# Patient Record
Sex: Male | Born: 1965 | Race: White | Hispanic: No | Marital: Married | Smoking: Former smoker
Health system: Southern US, Community
[De-identification: ages and names within clinical notes are randomized; demographics above are authoritative.]

## PROBLEM LIST (undated history)

## (undated) DIAGNOSIS — J309 Allergic rhinitis, unspecified: Secondary | ICD-10-CM

## (undated) DIAGNOSIS — E785 Hyperlipidemia, unspecified: Secondary | ICD-10-CM

## (undated) DIAGNOSIS — R1310 Dysphagia, unspecified: Secondary | ICD-10-CM

## (undated) DIAGNOSIS — Z872 Personal history of diseases of the skin and subcutaneous tissue: Secondary | ICD-10-CM

## (undated) DIAGNOSIS — K219 Gastro-esophageal reflux disease without esophagitis: Secondary | ICD-10-CM

## (undated) DIAGNOSIS — I10 Essential (primary) hypertension: Secondary | ICD-10-CM

## (undated) DIAGNOSIS — M109 Gout, unspecified: Secondary | ICD-10-CM

## (undated) HISTORY — DX: Allergic rhinitis, unspecified: J30.9

## (undated) HISTORY — DX: Dysphagia, unspecified: R13.10

## (undated) HISTORY — DX: Essential (primary) hypertension: I10

## (undated) HISTORY — DX: Gout, unspecified: M10.9

## (undated) HISTORY — DX: Hyperlipidemia, unspecified: E78.5

## (undated) HISTORY — PX: HIATAL HERNIA REPAIR: SHX195

## (undated) HISTORY — DX: Gastro-esophageal reflux disease without esophagitis: K21.9

## (undated) HISTORY — PX: KNEE SURGERY: SHX244

## (undated) HISTORY — DX: Personal history of diseases of the skin and subcutaneous tissue: Z87.2

## (undated) HISTORY — PX: KNEE ARTHROPLASTY: SHX992

---

## 2003-08-16 ENCOUNTER — Other Ambulatory Visit: Payer: Self-pay

## 2004-02-25 ENCOUNTER — Emergency Department: Payer: Self-pay | Admitting: Emergency Medicine

## 2004-11-07 ENCOUNTER — Emergency Department: Payer: Self-pay | Admitting: Emergency Medicine

## 2004-11-07 ENCOUNTER — Other Ambulatory Visit: Payer: Self-pay

## 2005-07-02 ENCOUNTER — Ambulatory Visit: Payer: Self-pay | Admitting: Gastroenterology

## 2005-07-02 ENCOUNTER — Other Ambulatory Visit: Payer: Self-pay

## 2005-08-02 ENCOUNTER — Emergency Department: Payer: Self-pay | Admitting: Emergency Medicine

## 2005-08-02 ENCOUNTER — Other Ambulatory Visit: Payer: Self-pay

## 2005-10-25 ENCOUNTER — Emergency Department: Payer: Self-pay | Admitting: Emergency Medicine

## 2005-11-13 ENCOUNTER — Emergency Department: Payer: Self-pay | Admitting: General Practice

## 2005-11-13 ENCOUNTER — Other Ambulatory Visit: Payer: Self-pay

## 2005-12-10 ENCOUNTER — Other Ambulatory Visit: Payer: Self-pay

## 2005-12-10 ENCOUNTER — Ambulatory Visit: Payer: Self-pay | Admitting: Gastroenterology

## 2006-02-13 ENCOUNTER — Ambulatory Visit: Payer: Self-pay | Admitting: Gastroenterology

## 2006-10-18 ENCOUNTER — Emergency Department: Payer: Self-pay | Admitting: Emergency Medicine

## 2007-01-06 ENCOUNTER — Emergency Department: Payer: Self-pay | Admitting: Emergency Medicine

## 2007-01-19 ENCOUNTER — Emergency Department: Payer: Self-pay

## 2007-03-16 ENCOUNTER — Ambulatory Visit: Payer: Self-pay | Admitting: Gastroenterology

## 2007-05-19 ENCOUNTER — Emergency Department: Payer: Self-pay | Admitting: Emergency Medicine

## 2008-08-29 ENCOUNTER — Ambulatory Visit: Payer: Self-pay | Admitting: Gastroenterology

## 2012-04-13 ENCOUNTER — Emergency Department: Payer: Self-pay | Admitting: Emergency Medicine

## 2013-06-10 ENCOUNTER — Ambulatory Visit: Payer: Self-pay | Admitting: Family Medicine

## 2013-06-17 ENCOUNTER — Encounter: Payer: Self-pay | Admitting: Neurology

## 2013-06-21 ENCOUNTER — Telehealth: Payer: Self-pay | Admitting: Neurology

## 2013-06-21 ENCOUNTER — Ambulatory Visit: Payer: BC Managed Care – PPO | Admitting: Neurology

## 2013-06-21 NOTE — Telephone Encounter (Signed)
Patient was no show for scheduled new patient visit on 06/21/2013

## 2013-07-16 ENCOUNTER — Telehealth: Payer: Self-pay | Admitting: Neurology

## 2013-07-16 NOTE — Telephone Encounter (Signed)
Called patient to r/s appointment from 06/21/13 @ 8 am, per patient he is waiting on new insurance's approval

## 2014-08-29 ENCOUNTER — Ambulatory Visit
Admit: 2014-08-29 | Disposition: A | Discharge status: Home / Self Care | Payer: Self-pay | Attending: Family Medicine | Admitting: Family Medicine

## 2015-01-26 ENCOUNTER — Telehealth: Payer: Self-pay | Admitting: Family Medicine

## 2015-01-26 MED ORDER — AMLODIPINE BESYLATE 5 MG PO TABS: 5.0000 mg | ORAL_TABLET | Freq: Every day | ORAL | Status: DC

## 2015-01-26 NOTE — Telephone Encounter (Signed)
Blood pressure 185/101; rechecked a few times, both arms Asymptomatic, no CP, no HA, no blurry vision He would like patient seen today I agree, okay to bring him over and I'll see him after lunch (not fast food, no added salt)

## 2015-01-26 NOTE — Telephone Encounter (Signed)
I spoke with patient He could not come in today for appointment as planned today I want to get him on medicine; he said his heart rate was 45 at doctor's office; I was not told that I explained Patient will call in the morning to schedule an appt with me Friday; okay to double book

## 2015-01-26 NOTE — Telephone Encounter (Signed)
PTS WIFE CALLED IN AND THE PTS JOB WAS THREATENED SO HE HAD TO GO TO WORK.

## 2015-01-27 ENCOUNTER — Ambulatory Visit: Payer: No Typology Code available for payment source

## 2015-01-27 VITALS — BP 141/90

## 2015-01-27 DIAGNOSIS — Z0130 Encounter for examination of blood pressure without abnormal findings: Secondary | ICD-10-CM

## 2015-01-27 NOTE — Telephone Encounter (Signed)
Pt can be double booked for Tues sept 20 at 8:45 or 10:45 if those times don't work then we will need to speak with Dr  Sherie Don again

## 2015-01-31 ENCOUNTER — Ambulatory Visit (INDEPENDENT_AMBULATORY_CARE_PROVIDER_SITE_OTHER): Payer: No Typology Code available for payment source | Admitting: Family Medicine

## 2015-01-31 ENCOUNTER — Encounter: Payer: Self-pay | Admitting: Family Medicine

## 2015-01-31 VITALS — BP 143/88 | HR 55 | Temp 97.0°F | Ht 68.25 in | Wt 166.0 lb

## 2015-01-31 DIAGNOSIS — M25511 Pain in right shoulder: Secondary | ICD-10-CM | POA: Diagnosis not present

## 2015-01-31 DIAGNOSIS — R634 Abnormal weight loss: Secondary | ICD-10-CM | POA: Diagnosis not present

## 2015-01-31 DIAGNOSIS — Z85820 Personal history of malignant melanoma of skin: Secondary | ICD-10-CM | POA: Diagnosis not present

## 2015-01-31 DIAGNOSIS — I10 Essential (primary) hypertension: Secondary | ICD-10-CM | POA: Insufficient documentation

## 2015-01-31 DIAGNOSIS — M109 Gout, unspecified: Secondary | ICD-10-CM | POA: Insufficient documentation

## 2015-01-31 DIAGNOSIS — R1310 Dysphagia, unspecified: Secondary | ICD-10-CM | POA: Insufficient documentation

## 2015-01-31 DIAGNOSIS — E785 Hyperlipidemia, unspecified: Secondary | ICD-10-CM | POA: Insufficient documentation

## 2015-01-31 DIAGNOSIS — M25519 Pain in unspecified shoulder: Secondary | ICD-10-CM | POA: Insufficient documentation

## 2015-01-31 MED ORDER — AMLODIPINE BESYLATE 5 MG PO TABS: 5.0000 mg | ORAL_TABLET | Freq: Every day | ORAL | Status: DC

## 2015-01-31 NOTE — Assessment & Plan Note (Signed)
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

## 2015-01-31 NOTE — Progress Notes (Signed)
BP 143/88 mmHg  Pulse 55  Temp(Src) 97 F (36.1 C)  Ht 5' 8.25" (1.734 m)  Wt 166 lb (75.297 kg)  BMI 25.04 kg/m2  SpO2 99%   Subjective:    Patient ID: Sean Ade., male    DOB: 11/09/1965, 49 y.o.   MRN: [id number]  HPI: Sean Delacruz. is a 49 y.o. male  Chief Complaint  Patient presents with  . Hypertension    Patient came in for BP check on Friday. There is still a pending note from Friday's BP check.   He was at the rheumatologist's office last week; nothing  Nothing out of the ordinary in regards to stress or dietary intake Stays away from certain foods because of his gout He does not take anything for allergies Back on the BP medicine; got a little headache for the first few days back on the medicine, but better now Trying to follow DASH guidelines  For his gout, he saw rheumatologist at [facility]; they think the spider bite affected his immune system; they are going to start him on a new medicine; if he starts to get a rash, then stop it and call them; he goes Thursday for an MRI of the thumb; spider bit him on the left elbow, but really affected his thumb; he spent 21 days off and on in the hospital; had a port in his chest, had IV antibiotics; they are checking labs  He is going to have surgery on his esophagus; lots of things he still can't eat; tries to avoid meat; mostly eats vegetables but has to watch citrus and tomato-based; he has lost 15 pounds, appetite has not been as good; he thinks it is all the esophagus problem; the insurance is now straightened out; he sees surgeon for this, at [facility]  They cut the place out of his back and it was cancerous, then went back a 2nd time for deeper incision and that came back negative; it was in fact melanoma; he was going back to see derm every 3 months for a while and then every 6 months now  Relevant past medical, surgical, family and social history reviewed and updated as indicated. Interim medical history since our  last visit reviewed. Allergies and medications reviewed and updated.  Review of Systems  Constitutional: Positive for unexpected weight change (he thinks this is the esophagus issue).  Cardiovascular: Negative for chest pain.  Musculoskeletal:       Knot on the right shoulder, noticed after had mole removed; went to massage therapist; sore to the touch  Skin:       No new moles  Neurological: Positive for headaches (for first few days on BP medicine, better now).  Hematological: Does not bruise/bleed easily.   Per HPI unless specifically indicated above     Objective:    BP 143/88 mmHg  Pulse 55  Temp(Src) 97 F (36.1 C)  Ht 5' 8.25" (1.734 m)  Wt 166 lb (75.297 kg)  BMI 25.04 kg/m2  SpO2 99%  Wt Readings from Last 3 Encounters:  01/31/15 166 lb (75.297 kg)  08/31/14 174 lb (78.926 kg)    Physical Exam  Constitutional: He appears well-developed and well-nourished. No distress.  HENT:  Head: Normocephalic and atraumatic.  Eyes: EOM are normal. No scleral icterus.  Neck: No thyromegaly present.  Cardiovascular: Regular rhythm.  Bradycardia present.   Pulmonary/Chest: Effort normal and breath sounds normal.  Abdominal: Soft. Bowel sounds are normal. He exhibits no distension.  Musculoskeletal: He exhibits no edema.       Right shoulder: He exhibits bony tenderness (slight tenderness over the [facility] joint right side; no limited ROM; no edema or erythema).       Left hand: He exhibits decreased range of motion (left thumb decreased ROM).  Neurological: Coordination normal.  Skin: Skin is warm and dry. No pallor.  Oval scar on upper right side back at site of melanoma; no dark pigment visualized  Psychiatric: He has a normal mood and affect. His behavior is normal. Judgment and thought content normal.    No results found for this or any previous visit.    Assessment & Plan:   Problem List Items Addressed This Visit      Cardiovascular and Mediastinum   Hypertension -  Primary    Try to follow the DASH guidelines; printed out in AVS for him; continue same dose of CCB; AVOID thiazide diuretics with his significant gout; patient will monitor his own BP and contact me if not controlled; we opted to not increase his medicine today since he has mild headaches on the CCB when initiating it      Relevant Medications   amLODipine (NORVASC) 5 MG tablet     Other   Hx of melanoma of skin    Surgical site examined; no evidence of pigment change or abnormality; keep f/u with dermatologist      Acromioclavicular joint pain    Topical ice, stretching      Weight loss, unintentional    Patient reports this is related to decreased intake, 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  An after-visit summary was printed and given to the patient at check-out.  Please see the patient instructions which may contain other information and recommendations beyond what is mentioned above in the assessment and plan.  Meds ordered this encounter  Medications  . amLODipine (NORVASC) 5 MG tablet    Sig: Take 1 tablet (5 mg total) by mouth daily.    Dispense:  30 tablet    Refill:  6

## 2015-01-31 NOTE — Assessment & Plan Note (Signed)
Try to follow the DASH guidelines; printed out in AVS for him; continue same dose of CCB; AVOID thiazide diuretics with his significant gout; patient will monitor his own BP and contact me if not controlled; we opted to not increase his medicine today since he has mild headaches on the CCB when initiating it

## 2015-01-31 NOTE — Patient Instructions (Addendum)
Do keep an eye on your blood pressure and notify me if top number over 140 and/or bottom number over 90 Try to follow the DASH guidelines Call me if BP not controlled Do have your other doctors keep me in the loop Wear sunscreen SPF 50+ if outdoors for any length of time, and try to avoid peak hours when possible Keep follow-up with dermatologist Try ice topically over the sore place 15 minutes at a time, 3 or 4 times a day; keep thin cloth between ice and skin Try stretching and massage Return in 3 months for follow-up of your blood pressure, but call or come in sooner if blood pressure not controlled or other issues are going on  DASH Eating Plan DASH stands for "Dietary Approaches to Stop Hypertension." The DASH eating plan is a healthy eating plan that has been shown to reduce high blood pressure (hypertension). Additional health benefits may include reducing the risk of type 2 diabetes mellitus, heart disease, and stroke. The DASH eating plan may also help with weight loss. WHAT DO I NEED TO KNOW ABOUT THE DASH EATING PLAN? For the DASH eating plan, you will follow these general guidelines:  Choose foods with a percent daily value for sodium of less than 5% (as listed on the food label).  Use salt-free seasonings or herbs instead of table salt or sea salt.  Check with your health care provider or pharmacist before using salt substitutes.  Eat lower-sodium products, often labeled as "lower sodium" or "no salt added."  Eat fresh foods.  Eat more vegetables, fruits, and low-fat dairy products.  Choose whole grains. Look for the word "whole" as the first word in the ingredient list.  Choose fish and skinless chicken or [place] more often than red meat. Limit fish, poultry, and meat to 6 oz (170 g) each day.  Limit sweets, desserts, sugars, and sugary drinks.  Choose heart-healthy fats.  Limit cheese to 1 oz (28 g) per day.  Eat more home-cooked food and less restaurant, buffet,  and fast food.  Limit fried foods.  Cook foods using methods other than frying.  Limit canned vegetables. If you do use them, rinse them well to decrease the sodium.  When eating at a restaurant, ask that your food be prepared with less salt, or no salt if possible. WHAT FOODS CAN I EAT? Seek help from a dietitian for individual calorie needs. Grains Whole grain or whole wheat bread. Brown rice. Whole grain or whole wheat pasta. Quinoa, bulgur, and whole grain cereals. Low-sodium cereals. Corn or whole wheat flour tortillas. Whole grain cornbread. Whole grain crackers. Low-sodium crackers. Vegetables Fresh or frozen vegetables (raw, steamed, roasted, or grilled). Low-sodium or reduced-sodium tomato and vegetable juices. Low-sodium or reduced-sodium tomato sauce and paste. Low-sodium or reduced-sodium canned vegetables.  Fruits All fresh, canned (in natural juice), or frozen fruits. Meat and Other Protein Products Ground beef (85% or leaner), grass-fed beef, or beef trimmed of fat. Skinless chicken or [place]. Ground chicken or [place]. Pork trimmed of fat. All fish and seafood. Eggs. Dried beans, peas, or lentils. Unsalted nuts and seeds. Unsalted canned beans. Dairy Low-fat dairy products, such as skim or 1% milk, 2% or reduced-fat cheeses, low-fat ricotta or cottage cheese, or plain low-fat yogurt. Low-sodium or reduced-sodium cheeses. Fats and Oils Tub margarines without trans fats. Light or reduced-fat mayonnaise and salad dressings (reduced sodium). Avocado. Safflower, olive, or canola oils. Natural peanut or almond butter. Other Unsalted popcorn and pretzels. The items listed above  may not be a complete list of recommended foods or beverages. Contact your dietitian for more options. WHAT FOODS ARE NOT RECOMMENDED? Grains White bread. White pasta. White rice. Refined cornbread. Bagels and croissants. Crackers that contain trans fat. Vegetables Creamed or fried vegetables. Vegetables  in a cheese sauce. Regular canned vegetables. Regular canned tomato sauce and paste. Regular tomato and vegetable juices. Fruits Dried fruits. Canned fruit in light or heavy syrup. Fruit juice. Meat and Other Protein Products Fatty cuts of meat. Ribs, chicken wings, bacon, sausage, bologna, salami, chitterlings, fatback, hot dogs, bratwurst, and packaged luncheon meats. Salted nuts and seeds. Canned beans with salt. Dairy Whole or 2% milk, cream, half-and-half, and cream cheese. Whole-fat or sweetened yogurt. Full-fat cheeses or blue cheese. Nondairy creamers and whipped toppings. Processed cheese, cheese spreads, or cheese curds. Condiments Onion and garlic salt, seasoned salt, table salt, and sea salt. Canned and packaged gravies. Worcestershire sauce. Tartar sauce. Barbecue sauce. Teriyaki sauce. Soy sauce, including reduced sodium. Steak sauce. Fish sauce. Oyster sauce. Cocktail sauce. Horseradish. Ketchup and mustard. Meat flavorings and tenderizers. Bouillon cubes. Hot sauce. Tabasco sauce. Marinades. Taco seasonings. Relishes. Fats and Oils Butter, stick margarine, lard, shortening, ghee, and bacon fat. Coconut, palm kernel, or palm oils. Regular salad dressings. Other Pickles and olives. Salted popcorn and pretzels. The items listed above may not be a complete list of foods and beverages to avoid. Contact your dietitian for more information. WHERE CAN I FIND MORE INFORMATION? National Heart, Lung, and Blood Institute: [employer] Document Released: 04/18/2011 Document Revised: 09/13/2013 Document Reviewed: 03/03/2013 [facility] Patient Information 2015 [place], [place]. This information is not intended to replace advice given to you by your health care provider. Make sure you discuss any questions you have with your health care provider.

## 2015-01-31 NOTE — Assessment & Plan Note (Signed)
Topical ice, stretching

## 2015-01-31 NOTE — Assessment & Plan Note (Signed)
Surgical site examined; no evidence of pigment change or abnormality; keep f/u with dermatologist

## 2015-01-31 NOTE — Assessment & Plan Note (Signed)
Managed by [facility] rheumatologist

## 2015-03-20 ENCOUNTER — Encounter: Payer: Self-pay | Admitting: Family Medicine

## 2015-03-20 ENCOUNTER — Ambulatory Visit (INDEPENDENT_AMBULATORY_CARE_PROVIDER_SITE_OTHER): Payer: No Typology Code available for payment source | Admitting: Family Medicine

## 2015-03-20 VITALS — BP 167/120 | HR 78 | Temp 99.2°F | Wt 167.0 lb

## 2015-03-20 DIAGNOSIS — M25461 Effusion, right knee: Secondary | ICD-10-CM | POA: Diagnosis not present

## 2015-03-20 DIAGNOSIS — M25561 Pain in right knee: Secondary | ICD-10-CM | POA: Diagnosis not present

## 2015-03-20 DIAGNOSIS — I10 Essential (primary) hypertension: Secondary | ICD-10-CM

## 2015-03-20 MED ORDER — DOXYCYCLINE HYCLATE 100 MG PO TABS: 100.0000 mg | ORAL_TABLET | Freq: Two times a day (BID) | ORAL | Status: DC

## 2015-03-20 MED ORDER — PREDNISONE 10 MG PO TABS: ORAL_TABLET | ORAL | Status: DC

## 2015-03-20 NOTE — Assessment & Plan Note (Signed)
Not under good control. Likely due to pain. Monitor. Continue medication and recheck at follow up in 1-2 weeks.

## 2015-03-20 NOTE — Patient Instructions (Addendum)
Knee Effusion Knee effusion means that you have excess fluid in your knee joint. This can cause pain and swelling in your knee. This may make your knee more difficult to bend and move. That is because there is increased pain and pressure in the joint. If there is fluid in your knee, it often means that something is wrong inside your knee, such as severe arthritis, abnormal inflammation, or an infection. Another common cause of knee effusion is an injury to the knee muscles, ligaments, or cartilage. HOME CARE INSTRUCTIONS  Use crutches as directed by your health care provider.  Wear a knee brace as directed by your health care provider.  Apply ice to the swollen area:  Put ice in a plastic bag.  Place a towel between your skin and the bag.  Leave the ice on for 20 minutes, 2-3 times per day.  Keep your knee raised (elevated) when you are sitting or lying down.  Take medicines only as directed by your health care provider.  Do any rehabilitation or strengthening exercises as directed by your health care provider.  Rest your knee as directed by your health care provider. You may start doing your normal activities again when your health care provider approves.   Keep all follow-up visits as directed by your health care provider. This is important. SEEK MEDICAL CARE IF:  You have ongoing (persistent) pain in your knee. SEEK IMMEDIATE MEDICAL CARE IF: 1. You have increased swelling or redness of your knee. 2. You have severe pain in your knee. 3. You have a fever.   This information is not intended to replace advice given to you by your health care provider. Make sure you discuss any questions you have with your health care provider.   Document Released: 07/20/2003 Document Revised: 05/20/2014 Document Reviewed: 12/13/2013 Elsevier Interactive Patient Education 2016 Else[employer] Inc[employer] Tick Bite Information Ticks are insects that attach themselves to the skin and draw blood for food.  There are various types of ticks. Common types include wood ticks and deer ticks. Most ticks live in shrubs and grassy areas. Ticks can climb onto your body when you make contact with leaves or grass where the tick is waiting. The most common places on the body for ticks to attach themselves are the scalp, neck, armpits, waist, and groin. Most tick bites are harmless, but sometimes ticks carry germs that cause diseases. These germs can be spread to a person during the tick's feeding process. The chance of a disease spreading through a tick bite depends on:   The type of tick.  Time of year.   How long the tick is attached.   Geographic location.  HOW CAN YOU PREVENT TICK BITES? Take these steps to help prevent tick bites when you are outdoors:  Wear protective clothing. Long sleeves and long pants are best.   Wear white clothes so you can see ticks more easily.  Tuck your pant legs into your socks.   If walking on a trail, stay in the middle of the trail to avoid brushing against bushes.  Avoid walking through areas with long grass.  Put insect repellent on all exposed skin and along boot tops, pant legs, and sleeve cuffs.   Check clothing, hair, and skin repeatedly and before going inside.   Brush off any ticks that are not attached.  Take a shower or bath as soon as possible after being outdoors.  WHAT IS THE PROPER WAY TO REMOVE A TICK? Ticks should be removed  as soon as possible to help prevent diseases caused by tick bites. 4. If latex gloves are available, put them on before trying to remove a tick.  5. Using fine-point tweezers, grasp the tick as close to the skin as possible. You may also use curved forceps or a tick removal tool. Grasp the tick as close to its head as possible. Avoid grasping the tick on its body. 6. Pull gently with steady upward pressure until the tick lets go. Do not twist the tick or jerk it suddenly. This may break off the tick's head or  mouth parts. 7. Do not squeeze or crush the tick's body. This could force disease-carrying fluids from the tick into your body.  8. After the tick is removed, wash the bite area and your hands with soap and water or other disinfectant such as alcohol. 9. Apply a small amount of antiseptic cream or ointment to the bite site.  10. Wash and disinfect any instruments that were used.  Do not try to remove a tick by applying a hot match, petroleum jelly, or fingernail polish to the tick. These methods do not work and may increase the chances of disease being spread from the tick bite.  WHEN SHOULD YOU SEEK MEDICAL CARE? Contact your health care provider if you are unable to remove a tick from your skin or if a part of the tick breaks off and is stuck in the skin.  After a tick bite, you need to be aware of signs and symptoms that could be related to diseases spread by ticks. Contact your health care provider if you develop any of the following in the days or weeks after the tick bite:  Unexplained fever.  Rash. A circular rash that appears days or weeks after the tick bite may indicate the possibility of Lyme disease. The rash may resemble a target with a bull's-eye and may occur at a different part of your body than the tick bite.  Redness and swelling in the area of the tick bite.   Tender, swollen lymph glands.   Diarrhea.   Weight loss.   Cough.   Fatigue.   Muscle, joint, or bone pain.   Abdominal pain.   Headache.   Lethargy or a change in your level of consciousness.  Difficulty walking or moving your legs.   Numbness in the legs.   Paralysis.  Shortness of breath.   Confusion.   Repeated vomiting.    This information is not intended to replace advice given to you by your health care provider. Make sure you discuss any questions you have with your health care provider.   Document Released: 04/26/2000 Document Revised: 05/20/2014 Document Reviewed:  10/07/2012 Elsevier Interactive Patient Education [employer]

## 2015-03-20 NOTE — Progress Notes (Signed)
BP 167/120 mmHg  Pulse 78  Temp(Src) 99.2 F (37.3 C)  Wt 167 lb (75.751 kg)  SpO2 100%   Subjective:    Patient ID: Sean AdeLawrence E Guerrieri Jr., male    DOB: 08-May-1966, 49 y.o.   MRN: [id number]  HPI: Sean AdeLawrence E Carlo Jr. is a 49 y.o. male  Chief Complaint  Patient presents with  . Knee Pain    Right X 5 days, sweeling pain and weakness. Patient states that he had a fever last night.   KNEE PAIN- Started on Thursday with a lot of pain and swelling, has been running a fever and has not been feeling well Duration: days Involved knee: right Mechanism of injury: unknown Location:diffuse Onset: sudden Severity: 10/10  Quality:  aching Frequency: constant Radiation: no Aggravating factors: weight bearing and walking  Alleviating factors: nothing  Status: worse Treatments attempted: rest, ice and ibuprofen tried colchicine and didn't help Relief with NSAIDs?:  no Weakness with weight bearing or walking: no Sensation of giving way: no Locking: no Popping: yes Bruising: no Swelling: yes Redness: no Paresthesias/decreased sensation: no Fevers: yes- subjective.   Relevant past medical, surgical, family and social history reviewed and updated as indicated. Interim medical history since our last visit reviewed. Allergies and medications reviewed and updated.  Review of Systems  Constitutional: Negative.   Respiratory: Negative.   Cardiovascular: Negative.   Musculoskeletal: Positive for joint swelling. Negative for myalgias, back pain, gait problem, neck pain and neck stiffness.  Psychiatric/Behavioral: Negative.     Per HPI unless specifically indicated above     Objective:    BP 167/120 mmHg  Pulse 78  Temp(Src) 99.2 F (37.3 C)  Wt 167 lb (75.751 kg)  SpO2 100%  Wt Readings from Last 3 Encounters:  03/20/15 167 lb (75.751 kg)  01/31/15 166 lb (75.297 kg)  08/31/14 174 lb (78.926 kg)    Physical Exam  Constitutional: He is oriented to person, place, and time. He  appears well-developed and well-nourished. No distress.  HENT:  Head: Normocephalic and atraumatic.  Right Ear: Hearing normal.  Left Ear: Hearing normal.  Nose: Nose normal.  Eyes: Conjunctivae and lids are normal. Right eye exhibits no discharge. Left eye exhibits no discharge. No scleral icterus.  Cardiovascular: Normal rate, regular rhythm, normal heart sounds and intact distal pulses.  Exam reveals no gallop and no friction rub.   No murmur heard. Pulmonary/Chest: Effort normal and breath sounds normal. No respiratory distress. He has no wheezes. He has no rales. He exhibits no tenderness.  Musculoskeletal: He exhibits edema and tenderness.  R knee: Decreased ROM to flexion and extension. + effusion of the knee joint. No redness, no heat, + tenderness to palpation along the joint line, antalgic gait, negative Mcmurrays, negative anterior and posterior drawer  Neurological: He is alert and oriented to person, place, and time. He has normal reflexes. He displays normal reflexes. No cranial nerve deficit. He exhibits normal muscle tone. Coordination normal.  Skin: Skin is warm, dry and intact. No rash noted. No erythema. No pallor.  Psychiatric: He has a normal mood and affect. His speech is normal and behavior is normal. Judgment and thought content normal. Cognition and memory are normal.  Nursing note and vitals reviewed.   No results found for this or any previous visit.    Assessment & Plan:   Problem List Items Addressed This Visit      Cardiovascular and Mediastinum   Hypertension    Not under good control. Likely  due to pain. Monitor. Continue medication and recheck at follow up in 1-2 weeks.        Other Visit Diagnoses    Effusion of right knee joint    -  Primary    Possibly due to tick bourne illness. Checking labs. Will start empirically on prednisone and doxy. Follow up 1-2 weeks. Use ice and crutches.     Relevant Orders    Lyme Ab/Western Blot Reflex    Rocky mtn  spotted fvr abs pnl(IgG+IgM)    Ehrlichia Antibody Panel    Babesia microti Antibody Panel    Comprehensive metabolic panel    CBC with Differential/Platelet    Uric acid    Knee pain, acute, right        Possibly due to tick bourne illness. Checking labs. Will start empirically on prednisone and doxy. Follow up 1-2 weeks. Use ice and crutches.         Follow up plan: Return 1-2 weeks.

## 2015-03-21 LAB — COMPREHENSIVE METABOLIC PANEL
A/G RATIO: 1.4 (ref 1.1–2.5)
ALBUMIN: 4.2 g/dL (ref 3.5–5.5)
ALT: 21 IU/L (ref 0–44)
AST: 15 IU/L (ref 0–40)
Alkaline Phosphatase: 84 IU/L (ref 39–117)
BILIRUBIN TOTAL: 0.6 mg/dL (ref 0.0–1.2)
BUN / CREAT RATIO: 9 (ref 9–20)
BUN: 11 mg/dL (ref 6–24)
CALCIUM: 9.3 mg/dL (ref 8.7–10.2)
CHLORIDE: 100 mmol/L (ref 97–106)
CO2: 24 mmol/L (ref 18–29)
Creatinine, Ser: 1.17 mg/dL (ref 0.76–1.27)
GFR calc non Af Amer: 73 mL/min/{1.73_m2} (ref >59)
GFR, EST AFRICAN AMERICAN: 85 mL/min/{1.73_m2} (ref >59)
GLOBULIN, TOTAL: 3 g/dL (ref 1.5–4.5)
GLUCOSE: 93 mg/dL (ref 65–99)
POTASSIUM: 4.5 mmol/L (ref 3.5–5.2)
Sodium: 139 mmol/L (ref 136–144)
TOTAL PROTEIN: 7.2 g/dL (ref 6.0–8.5)

## 2015-03-21 LAB — CBC WITH DIFFERENTIAL/PLATELET
BASOS ABS: 0 10*3/uL (ref 0.0–0.2)
Basos: 0 %
EOS (ABSOLUTE): 0.1 10*3/uL (ref 0.0–0.4)
Eos: 1 %
HEMOGLOBIN: 15 g/dL (ref 12.6–17.7)
Hematocrit: 43.9 % (ref 37.5–51.0)
Immature Grans (Abs): 0 10*3/uL (ref 0.0–0.1)
Immature Granulocytes: 0 %
LYMPHS ABS: 1.5 10*3/uL (ref 0.7–3.1)
Lymphs: 15 %
MCH: 28.8 pg (ref 26.6–33.0)
MCHC: 34.2 g/dL (ref 31.5–35.7)
MCV: 84 fL (ref 79–97)
MONOCYTES: 11 %
Monocytes Absolute: 1.1 10*3/uL — ABNORMAL HIGH (ref 0.1–0.9)
NEUTROS ABS: 7 10*3/uL (ref 1.4–7.0)
Neutrophils: 73 %
Platelets: 294 10*3/uL (ref 150–379)
RBC: 5.2 x10E6/uL (ref 4.14–5.80)
RDW: 13.7 % (ref 12.3–15.4)
WBC: 9.6 10*3/uL (ref 3.4–10.8)

## 2015-03-21 LAB — EHRLICHIA ANTIBODY PANEL
E. Chaffeensis (HME) IgM Titer: NEGATIVE
E.Chaffeensis (HME) IgG: NEGATIVE
HGE IGM TITER: NEGATIVE
HGE IgG Titer: NEGATIVE

## 2015-03-21 LAB — LYME AB/WESTERN BLOT REFLEX: Lyme IgG/IgM Ab: <0.91 {ISR} (ref 0.00–0.90)

## 2015-03-21 LAB — URIC ACID: Uric Acid: 6 mg/dL (ref 3.7–8.6)

## 2015-03-22 LAB — RMSF, IGG, IFA

## 2015-03-22 LAB — BABESIA MICROTI ANTIBODY PANEL

## 2015-03-22 LAB — ROCKY MTN SPOTTED FVR ABS PNL(IGG+IGM): RMSF IgG: POSITIVE — AB

## 2015-03-23 ENCOUNTER — Telehealth: Payer: Self-pay | Admitting: Family Medicine

## 2015-03-23 NOTE — Telephone Encounter (Signed)
Pt called needs doctors note for Monday-Wednesday for work as he was out due to his illness from his visit on 03/20/15. Please call pt when note is ready for pick up. Thanks.

## 2015-03-23 NOTE — Telephone Encounter (Signed)
Called patient with the results of his blood work. +RMSF- on good medicine for that. Feeling better. Does need note for work faxed to his work. Written and sent

## 2015-03-27 ENCOUNTER — Ambulatory Visit: Payer: No Typology Code available for payment source | Admitting: Family Medicine

## 2015-03-28 ENCOUNTER — Encounter: Payer: Self-pay | Admitting: Family Medicine

## 2015-03-28 ENCOUNTER — Ambulatory Visit (INDEPENDENT_AMBULATORY_CARE_PROVIDER_SITE_OTHER): Payer: No Typology Code available for payment source | Admitting: Family Medicine

## 2015-03-28 VITALS — BP 163/97 | HR 80 | Temp 98.3°F | Wt 165.0 lb

## 2015-03-28 DIAGNOSIS — R5382 Chronic fatigue, unspecified: Secondary | ICD-10-CM | POA: Diagnosis not present

## 2015-03-28 DIAGNOSIS — I10 Essential (primary) hypertension: Secondary | ICD-10-CM | POA: Diagnosis not present

## 2015-03-28 DIAGNOSIS — R0602 Shortness of breath: Secondary | ICD-10-CM

## 2015-03-28 MED ORDER — AMLODIPINE BESYLATE 10 MG PO TABS: 10.0000 mg | ORAL_TABLET | Freq: Every day | ORAL | Status: DC

## 2015-03-28 NOTE — Progress Notes (Signed)
BP 163/97 mmHg  Pulse 80  Temp(Src) 98.3 F (36.8 C)  Wt 165 lb (74.844 kg)  SpO2 98%   Subjective:    Patient ID: Sean Ade., male    DOB: 12-25-1965, 49 y.o.   MRN: [id number]  HPI: Sean Roye. is a 49 y.o. male  Chief Complaint  Patient presents with  . Hypertension    Patient complains of weakness and headache   HYPERTENSION Hypertension status: uncontrolled  Satisfied with current treatment? no Duration of hypertension: chronic BP monitoring frequency:  rarely BP range: high 140s to 90s BP medication side effects:  no Medication compliance: excellent compliance Previous BP meds: benazipril, losartan Aspirin: no Recurrent headaches: yes Visual changes: yes Palpitations: yes Dyspnea: yes, very dry and loses his voice Chest pain: no Lower extremity edema: no Dizzy/lightheaded: yes  Allopurinol made his chest hurt, going back to see rheumatology this month  Relevant past medical, surgical, family and social history reviewed and updated as indicated. Interim medical history since our last visit reviewed. Allergies and medications reviewed and updated.  Review of Systems  Constitutional: Negative.   Respiratory: Negative.   Cardiovascular: Negative.     Per HPI unless specifically indicated above     Objective:    BP 163/97 mmHg  Pulse 80  Temp(Src) 98.3 F (36.8 C)  Wt 165 lb (74.844 kg)  SpO2 98%  Wt Readings from Last 3 Encounters:  03/28/15 165 lb (74.844 kg)  03/20/15 167 lb (75.751 kg)  01/31/15 166 lb (75.297 kg)    Physical Exam  Constitutional: He is oriented to person, place, and time. He appears well-developed and well-nourished. No distress.  HENT:  Head: Normocephalic and atraumatic.  Right Ear: Hearing normal.  Left Ear: Hearing normal.  Nose: Nose normal.  Eyes: Conjunctivae and lids are normal. Right eye exhibits no discharge. Left eye exhibits no discharge. No scleral icterus.  Cardiovascular: Normal rate, regular  rhythm, normal heart sounds and intact distal pulses.  Exam reveals no gallop and no friction rub.   No murmur heard. Pulmonary/Chest: Effort normal and breath sounds normal. No respiratory distress. He has no wheezes. He has no rales. He exhibits no tenderness.  Musculoskeletal: Normal range of motion.  Neurological: He is alert and oriented to person, place, and time.  Skin: Skin is warm, dry and intact. No rash noted. No erythema. No pallor.  Psychiatric: He has a normal mood and affect. His speech is normal and behavior is normal. Judgment and thought content normal. Cognition and memory are normal.  Nursing note and vitals reviewed.   Results for orders placed or performed in visit on 03/20/15  Lyme Ab/Western Blot Reflex  Result Value Ref Range   Lyme IgG/IgM Ab <0.91 0.00 - 0.90 ISR   LYME DISEASE AB, QUANT, IGM <0.80 0.00 - 0.79 index  Rocky mtn spotted fvr abs pnl(IgG+IgM)  Result Value Ref Range   RMSF IgG Positive (A) Negative  Ehrlichia Antibody Panel  Result Value Ref Range   E.Chaffeensis (HME) IgG Negative Neg:<1:64   E. Chaffeensis (HME) IgM Titer Negative Neg:<1:20   HGE IgG Titer Negative Neg:<1:64   HGE IgM Titer Negative Neg:<1:20  Babesia microti Antibody Panel  Result Value Ref Range   Babesia microti IgM <1:10 Neg:<1:10   Babesia microti IgG <1:10 Neg:<1:10  Comprehensive metabolic panel  Result Value Ref Range   Glucose 93 65 - 99 mg/dL   BUN 11 6 - 24 mg/dL   Creatinine, Ser 8.29 0.76 -  1.27 mg/dL   GFR calc non Af Amer 73 >59 mL/min/1.73   GFR calc Af Amer 85 >59 mL/min/1.73   BUN/Creatinine Ratio 9 9 - 20   Sodium 139 136 - 144 mmol/L   Potassium 4.5 3.5 - 5.2 mmol/L   Chloride 100 97 - 106 mmol/L   CO2 24 18 - 29 mmol/L   Calcium 9.3 8.7 - 10.2 mg/dL   Total Protein 7.2 6.0 - 8.5 g/dL   Albumin 4.2 3.5 - 5.5 g/dL   Globulin, Total 3.0 1.5 - 4.5 g/dL   Albumin/Globulin Ratio 1.4 1.1 - 2.5   Bilirubin Total 0.6 0.0 - 1.2 mg/dL   Alkaline  Phosphatase 84 39 - 117 IU/L   AST 15 0 - 40 IU/L   ALT 21 0 - 44 IU/L  CBC with Differential/Platelet  Result Value Ref Range   WBC 9.6 3.4 - 10.8 x10E3/uL   RBC 5.20 4.14 - 5.80 x10E6/uL   Hemoglobin 15.0 12.6 - 17.7 g/dL   Hematocrit 29.543.9 62.137.5 - 51.0 %   MCV 84 79 - 97 fL   MCH 28.8 26.6 - 33.0 pg   MCHC 34.2 31.5 - 35.7 g/dL   RDW 30.813.7 65.712.3 - 84.615.4 %   Platelets 294 150 - 379 x10E3/uL   Neutrophils 73 %   Lymphs 15 %   Monocytes 11 %   Eos 1 %   Basos 0 %   Neutrophils Absolute 7.0 1.4 - 7.0 x10E3/uL   Lymphocytes Absolute 1.5 0.7 - 3.1 x10E3/uL   Monocytes Absolute 1.1 (H) 0.1 - 0.9 x10E3/uL   EOS (ABSOLUTE) 0.1 0.0 - 0.4 x10E3/uL   Basophils Absolute 0.0 0.0 - 0.2 x10E3/uL   Immature Granulocytes 0 %   Immature Grans (Abs) 0.0 0.0 - 0.1 x10E3/uL  Uric acid  Result Value Ref Range   Uric Acid 6.0 3.7 - 8.6 mg/dL  RMSF, IgG, IFA  Result Value Ref Range   RMSF, IGG, IFA <1:64 Neg <1:64      Assessment & Plan:   Problem List Items Addressed This Visit      Cardiovascular and Mediastinum   Hypertension - Primary    Not under good control. Has been persistently elevated. Will increase his amlodipine to 10mg  daily and will recheck in 1 month with PCP. Continue to monitor.       Relevant Medications   amLODipine (NORVASC) 10 MG tablet    Other Visit Diagnoses    SOB (shortness of breath)        EKG normal today. Lungs clear. Possibly due to change in weather and RMSF. Monitor closely. If not getting better, let us know    Relevant Orders    EKG 12-Lead (Completed)    Chronic fatigue        Likely due to RMSF and medication. Monitor closely. Recheck at regular appointment in 1 month.         Follow up plan: Return As scheduled.

## 2015-03-28 NOTE — Assessment & Plan Note (Signed)
Not under good control. Has been persistently elevated. Will increase his amlodipine to 10mg  daily and will recheck in 1 month with PCP. Continue to monitor.

## 2015-05-02 ENCOUNTER — Ambulatory Visit: Payer: No Typology Code available for payment source | Admitting: Family Medicine

## 2015-05-17 ENCOUNTER — Ambulatory Visit: Payer: No Typology Code available for payment source | Admitting: Family Medicine

## 2015-05-29 ENCOUNTER — Ambulatory Visit: Payer: Self-pay | Admitting: Family Medicine

## 2015-06-09 ENCOUNTER — Encounter: Payer: Self-pay | Admitting: Family Medicine

## 2015-06-13 ENCOUNTER — Ambulatory Visit (INDEPENDENT_AMBULATORY_CARE_PROVIDER_SITE_OTHER): Payer: 59 | Admitting: Family Medicine

## 2015-06-13 ENCOUNTER — Encounter: Payer: Self-pay | Admitting: Family Medicine

## 2015-06-13 VITALS — BP 165/106 | HR 63 | Temp 97.6°F | Ht 67.5 in | Wt 166.0 lb

## 2015-06-13 DIAGNOSIS — M10021 Idiopathic gout, right elbow: Secondary | ICD-10-CM

## 2015-06-13 DIAGNOSIS — M7052 Other bursitis of knee, left knee: Secondary | ICD-10-CM | POA: Diagnosis not present

## 2015-06-13 DIAGNOSIS — M109 Gout, unspecified: Secondary | ICD-10-CM | POA: Insufficient documentation

## 2015-06-13 DIAGNOSIS — Z23 Encounter for immunization: Secondary | ICD-10-CM

## 2015-06-13 DIAGNOSIS — I10 Essential (primary) hypertension: Secondary | ICD-10-CM

## 2015-06-13 MED ORDER — HYDRALAZINE HCL 10 MG PO TABS: 10.0000 mg | ORAL_TABLET | Freq: Three times a day (TID) | ORAL | Status: DC

## 2015-06-13 MED ORDER — OXYCODONE-ACETAMINOPHEN 5-325 MG PO TABS: 1.0000 | ORAL_TABLET | ORAL | Status: DC | PRN

## 2015-06-13 NOTE — Patient Instructions (Addendum)
Take 10 mg of hydralazine up to three times a day if needed for pressures over 140/90 Start the pain medicine oxycodone, but do not drive if it makes you sleepy or groggy See rheumatologist TODAY at [facility] for this flare and other conditions (thumb and knee)

## 2015-06-13 NOTE — Assessment & Plan Note (Signed)
Discussed with rheum; they will see him today and consider drainage

## 2015-06-13 NOTE — Assessment & Plan Note (Signed)
Gout flare of the right elbow; no known precipitating factor; however, patient has NOT been taking allopurinol as directed back in Sept; was lost to f/u at [facility]; I personally spoke with his rheumatologist who was extremely helpful, and she offered to have him seen today at 1:30 at [facility]; I agreed to write pain medicine and something PRN for BP associated with pain, and they will direct his gout agents

## 2015-06-13 NOTE — Progress Notes (Signed)
BP 165/106 mmHg  Pulse 63  Temp(Src) 97.6 F (36.4 C)  Ht 5' 7.5" (1.715 m)  Wt 166 lb (75.297 kg)  BMI 25.60 kg/m2  SpO2 100%   Subjective:    Patient ID: Sean Ade., male    DOB: Dec 08, 1965, 50 y.o.   MRN: [id number]  HPI: Sean Winsett. is a 50 y.o. male  Chief Complaint  Patient presents with  . Gout    since Thursday night in the right elbow. He was given Allopurinol by the rheumatologist, but he has side effects from it. Also the last brand of colchicine does not work as well.    He suffered a gout flare of his right elbow; pain and swelling and redness started on Thursday night; worsened into Friday and has been painful since then; right elbow swollen and painful with limited ROM; cannot recall anything he ate to set this off; he does not drink alcohol; he had not been taking allopurinol; that was prescribed for him to take daily (Sept 2016); however, he has not been taking it; then he decided to take one of the allopurinol pills on Sunday night and he felt even worse on Monday; pain level is a 5 or 6 out of 10 right now; hurts with movement; taking ibuprofen two pills twice a day just since Thursday or Friday  He sees a rheumatologist at [facility] for his gout, Dr. Krystal Clark; I called her personally about his gout flare; see A/P  Blood pressure is high today; he says it is the pain; usually controlled; he has a way to check his blood pressure at home, and also checks at pharmacy; usually in the 120s when not in pain  Relevant past medical, surgical hx reviewed Allergies and medications reviewed and updated.  Review of Systems Per HPI unless specifically indicated above     Objective:    BP 165/106 mmHg  Pulse 63  Temp(Src) 97.6 F (36.4 C)  Ht 5' 7.5" (1.715 m)  Wt 166 lb (75.297 kg)  BMI 25.60 kg/m2  SpO2 100%  Wt Readings from Last 3 Encounters:  06/13/15 166 lb (75.297 kg)  03/28/15 165 lb (74.844 kg)  03/20/15 167 lb (75.751 kg)    Physical  Exam  Constitutional: He appears well-developed and well-nourished.  Cardiovascular: Normal rate.   Pulmonary/Chest: Effort normal.  Musculoskeletal:       Right elbow: He exhibits decreased range of motion and swelling. Tenderness found.       Left knee: He exhibits swelling (over the infrapatellar tendon, soft mobile swelling about 4 cm across; no increased warmth to the touch).  Psychiatric: He has a normal mood and affect.      Assessment & Plan:   Problem List Items Addressed This Visit      Cardiovascular and Mediastinum   Hypertension    High today; likely related to pain; patient will monitor BP and use PRN hydralazine if needed; continue amlodipine; I think once pain controlled, this will return to normal      Relevant Medications   hydrALAZINE (APRESOLINE) 10 MG tablet     Musculoskeletal and Integument   Acute gout of right elbow - Primary    Gout flare of the right elbow; no known precipitating factor; however, patient has NOT been taking allopurinol as directed back in Sept; was lost to f/u at [facility]; I personally spoke with his rheumatologist who was extremely helpful, and she offered to have him seen today at 1:30 at  [facility]; I agreed to write pain medicine and something PRN for BP associated with pain, and they will direct his gout agents      Relevant Medications   oxyCODONE-acetaminophen (PERCOCET/ROXICET) 5-325 MG tablet   Infrapatellar bursitis of left knee    Discussed with rheum; they will see him today and consider drainage      Relevant Medications   oxyCODONE-acetaminophen (PERCOCET/ROXICET) 5-325 MG tablet    Other Visit Diagnoses    Needs flu shot        Encounter for immunization            Follow up plan: Return in about 3 days (around 06/16/2015) for bp.  An after-visit summary was printed and given to the patient at check-out.  Please see the patient instructions which may contain other information and recommendations beyond what is mentioned above  in the assessment and plan. Meds ordered this encounter  Medications  . oxyCODONE-acetaminophen (PERCOCET/ROXICET) 5-325 MG tablet    Sig: Take 1 tablet by mouth every 4 (four) hours as needed for severe pain.    Dispense:  20 tablet    Refill:  0  . hydrALAZINE (APRESOLINE) 10 MG tablet    Sig: Take 1 tablet (10 mg total) by mouth 3 (three) times daily. If needed for high blood pressure    Dispense:  30 tablet    Refill:  0

## 2015-06-13 NOTE — Assessment & Plan Note (Signed)
High today; likely related to pain; patient will monitor BP and use PRN hydralazine if needed; continue amlodipine; I think once pain controlled, this will return to normal

## 2015-06-13 NOTE — Assessment & Plan Note (Signed)
Gout flare of the right elbow; no known precipitating factor; however, patient has NOT been taking allopurinol as directed back in Sept; was lost to f/u at [facility]; I personally spoke with his rheumatologist who was extremely helpful, and she offered to have him seen today at 1:30 at [facility]; I agreed to write pain medicine and something PRN for BP associated with pain, and they will direct his gout agents 

## 2015-06-14 ENCOUNTER — Telehealth: Payer: Self-pay | Admitting: Family Medicine

## 2015-06-14 NOTE — Telephone Encounter (Signed)
-----   Message from Sharol Given sent at 06/14/2015 10:06 AM EST ----- Contact: [phone] [facility] called and stated that the pt did not come to the appt yesterday but is suppose to go on Friday. A call back number for Dr Garnette Gunner is listed above if you need to follow up.

## 2015-06-16 ENCOUNTER — Ambulatory Visit: Payer: 59 | Admitting: Family Medicine

## 2016-01-08 ENCOUNTER — Ambulatory Visit: Payer: 59 | Admitting: Family Medicine

## 2016-04-22 ENCOUNTER — Encounter: Payer: Self-pay | Admitting: Family Medicine

## 2016-04-22 ENCOUNTER — Ambulatory Visit (INDEPENDENT_AMBULATORY_CARE_PROVIDER_SITE_OTHER): Payer: 59 | Admitting: Family Medicine

## 2016-04-22 VITALS — BP 148/88 | HR 66 | Temp 98.2°F | Wt 167.0 lb

## 2016-04-22 DIAGNOSIS — M7702 Medial epicondylitis, left elbow: Secondary | ICD-10-CM | POA: Diagnosis not present

## 2016-04-22 DIAGNOSIS — Z23 Encounter for immunization: Secondary | ICD-10-CM

## 2016-04-22 MED ORDER — PREDNISONE 20 MG PO TABS: 40.0000 mg | ORAL_TABLET | Freq: Every day | ORAL | 0 refills | Status: DC

## 2016-04-22 NOTE — Progress Notes (Signed)
BP (!) 148/88   Pulse 66   Temp 98.2 F (36.8 C)   Wt 167 lb (75.8 kg)   SpO2 96%   BMI 25.77 kg/m    Subjective:    Patient ID: Sean AdeLawrence E Defoor Jr., male    DOB: 1965-09-25, 50 y.o.   MRN: [id number]  HPI: Sean AdeLawrence E Bergren Jr. is a 50 y.o. male  Chief Complaint  Patient presents with  . Arm Pain    left arm x 3 weeks.  Started noticing he didn't have any strength in his hand. Then has occasional sharp pain in his arm.    Patient presents with 3 week history of left elbow pain, hand weakness, and sharp shooting pains radiating down lower left arm. Also having occasional numbness/tingling in left hand. States all of it started when he began some home renovation projects recently. No known injury, and no hx of injury or other issues with that arm. Has been taking occasional advil with some relief. No bruising, swelling, redness, fever, chills.   Relevant past medical, surgical, family and social history reviewed and updated as indicated. Interim medical history since our last visit reviewed. Allergies and medications reviewed and updated.  Review of Systems  HENT: Negative.   Eyes: Negative.   Respiratory: Negative.   Cardiovascular: Negative.   Gastrointestinal: Negative.   Genitourinary: Negative.   Musculoskeletal: Positive for arthralgias.  Neurological: Positive for weakness and numbness.  Psychiatric/Behavioral: Negative.     Per HPI unless specifically indicated above     Objective:    BP (!) 148/88   Pulse 66   Temp 98.2 F (36.8 C)   Wt 167 lb (75.8 kg)   SpO2 96%   BMI 25.77 kg/m   Wt Readings from Last 3 Encounters:  04/22/16 167 lb (75.8 kg)  06/13/15 166 lb (75.3 kg)  03/28/15 165 lb (74.8 kg)    Physical Exam  Constitutional: He is oriented to person, place, and time. He appears well-developed and well-nourished. No distress.  HENT:  Head: Atraumatic.  Eyes: Conjunctivae are normal. Pupils are equal, round, and reactive to light. No scleral  icterus.  Neck: Normal range of motion. Neck supple.  Cardiovascular: Normal rate.   Pulmonary/Chest: Effort normal. No respiratory distress.  Musculoskeletal: Normal range of motion. He exhibits tenderness (TTP over left medial elbow ). He exhibits no edema.  Some discomfort with extension of left elbow Decreased grip strength in left hand  Neurological: He is alert and oriented to person, place, and time.  Neurovascularly intact b/l UEs  Skin: Skin is warm and dry. No rash noted. No erythema.  Psychiatric: He has a normal mood and affect. His behavior is normal.  Nursing note and vitals reviewed.     Assessment & Plan:   Problem List Items Addressed This Visit    None    Visit Diagnoses    Medial epicondylitis of left elbow    -  Primary   Will treat with prednisone burst and tylenol. Recommended elbow brace, gentle stretches, epsom salt soaks, heat, rest from repetitive movements   Relevant Medications   acetaminophen (TYLENOL) 325 MG tablet   predniSONE (DELTASONE) 20 MG tablet   Needs flu shot       Encounter for immunization       Relevant Orders   Flu Vaccine QUAD 36+ mos IM (Completed)    Follow up in about a week if no improvement with conservative measures.    Follow up plan: Return if  symptoms worsen or fail to improve.

## 2016-04-22 NOTE — Patient Instructions (Signed)
Follow up in about a week if no improvement

## 2016-06-11 ENCOUNTER — Ambulatory Visit: Payer: 59 | Admitting: Family Medicine

## 2016-07-03 ENCOUNTER — Emergency Department: Payer: Self-pay

## 2016-07-03 ENCOUNTER — Encounter: Payer: Self-pay | Admitting: Emergency Medicine

## 2016-07-03 ENCOUNTER — Emergency Department
Admission: EM | Admit: 2016-07-03 | Discharge: 2016-07-03 | Disposition: A | Discharge status: Home / Self Care | Payer: Self-pay | Attending: Emergency Medicine | Admitting: Emergency Medicine

## 2016-07-03 DIAGNOSIS — Z23 Encounter for immunization: Secondary | ICD-10-CM | POA: Insufficient documentation

## 2016-07-03 DIAGNOSIS — W1839XA Other fall on same level, initial encounter: Secondary | ICD-10-CM | POA: Insufficient documentation

## 2016-07-03 DIAGNOSIS — Y929 Unspecified place or not applicable: Secondary | ICD-10-CM | POA: Insufficient documentation

## 2016-07-03 DIAGNOSIS — R55 Syncope and collapse: Secondary | ICD-10-CM | POA: Insufficient documentation

## 2016-07-03 DIAGNOSIS — S0990XA Unspecified injury of head, initial encounter: Secondary | ICD-10-CM

## 2016-07-03 DIAGNOSIS — S0003XA Contusion of scalp, initial encounter: Secondary | ICD-10-CM | POA: Insufficient documentation

## 2016-07-03 DIAGNOSIS — I10 Essential (primary) hypertension: Secondary | ICD-10-CM | POA: Insufficient documentation

## 2016-07-03 DIAGNOSIS — Y990 Civilian activity done for income or pay: Secondary | ICD-10-CM | POA: Insufficient documentation

## 2016-07-03 DIAGNOSIS — Z87891 Personal history of nicotine dependence: Secondary | ICD-10-CM | POA: Insufficient documentation

## 2016-07-03 DIAGNOSIS — Y939 Activity, unspecified: Secondary | ICD-10-CM | POA: Insufficient documentation

## 2016-07-03 LAB — BASIC METABOLIC PANEL
Anion gap: 5 (ref 5–15)
BUN: 13 mg/dL (ref 6–20)
CO2: 25 mmol/L (ref 22–32)
Calcium: 8.9 mg/dL (ref 8.9–10.3)
Chloride: 109 mmol/L (ref 101–111)
Creatinine, Ser: 1.15 mg/dL (ref 0.61–1.24)
GFR calc Af Amer: >60 mL/min (ref >60)
Glucose, Bld: 115 mg/dL — ABNORMAL HIGH (ref 65–99)
POTASSIUM: 4 mmol/L (ref 3.5–5.1)
SODIUM: 139 mmol/L (ref 135–145)

## 2016-07-03 LAB — CBC
HEMATOCRIT: 41.3 % (ref 40.0–52.0)
Hemoglobin: 14.5 g/dL (ref 13.0–18.0)
MCH: 29.6 pg (ref 26.0–34.0)
MCHC: 35.1 g/dL (ref 32.0–36.0)
MCV: 84.3 fL (ref 80.0–100.0)
PLATELETS: 224 10*3/uL (ref 150–440)
RBC: 4.9 MIL/uL (ref 4.40–5.90)
RDW: 13.2 % (ref 11.5–14.5)
WBC: 5.8 10*3/uL (ref 3.8–10.6)

## 2016-07-03 MED ORDER — SODIUM CHLORIDE 0.9 % IV SOLN
Freq: Once | INTRAVENOUS | Status: AC
  Administered 2016-07-03: 11:00:00 via INTRAVENOUS

## 2016-07-03 MED ORDER — TETANUS-DIPHTH-ACELL PERTUSSIS 5-2.5-18.5 LF-MCG/0.5 IM SUSP
0.5000 mL | Freq: Once | INTRAMUSCULAR | Status: AC
  Administered 2016-07-03: 0.5 mL via INTRAMUSCULAR
  Filled 2016-07-03: qty 0.5

## 2016-07-03 NOTE — ED Notes (Signed)
Snack provided

## 2016-07-03 NOTE — ED Notes (Signed)
ED Provider at bedside. 

## 2016-07-03 NOTE — ED Provider Notes (Signed)
[facility] Emergency Department Provider Note        Time seen: ----------------------------------------- 10:17 AM on 07/03/2016 -----------------------------------------    I have reviewed the triage vital signs and the nursing notes.   HISTORY  Chief Complaint Loss of Consciousness    HPI Sean Delacruz. is a 51 y.o. male who presents to ER after a syncopal event at work. Patient states she was feeling normally this morning but has not eaten breakfast which is unusual for him. Patient states he went to get something to eat and he fell backwards striking his scalp posteriorly. He did have loss of consciousness prior to the fall. He is complaining of a headache currently. He denies any recent illness or other complaints. He has never had this happen before.   Past Medical History:  Diagnosis Date  . Allergic rhinitis   . Dysphagia   . Gout   . History of cellulitis 11/2013 and 8/15  . Hyperlipidemia   . Hypertension     Patient Active Problem List   Diagnosis Date Noted  . Acute gout of right elbow 06/13/2015  . Infrapatellar bursitis of left knee 06/13/2015  . Hx of melanoma of skin 01/31/2015  . Acromioclavicular joint pain 01/31/2015  . Weight loss, unintentional 01/31/2015  . Hyperlipidemia   . Hypertension   . Gout   . Dysphagia     Past Surgical History:  Procedure Laterality Date  . HIATAL HERNIA REPAIR     x3  . KNEE SURGERY      Allergies Patient has no known allergies.  Social History Social History  Substance Use Topics  . Smoking status: Former Smoker    Years: 8.00    Types: Cigarettes    Quit date: 05/13/1996  . Smokeless tobacco: Never Used  . Alcohol use No    Review of Systems Constitutional: Negative for fever. Cardiovascular: Negative for chest pain. Respiratory: Negative for shortness of breath. Gastrointestinal: Negative for abdominal pain, vomiting and diarrhea. Genitourinary: Negative for  dysuria. Musculoskeletal: Negative for back pain. Skin: Negative for rash. Neurological: Positive for headache  10-point ROS otherwise negative.  ____________________________________________   PHYSICAL EXAM:  VITAL SIGNS: ED Triage Vitals  Enc Vitals Group     BP 07/03/16 0946 (!) 161/100     Pulse Rate 07/03/16 0946 (!) 53     Resp 07/03/16 0946 18     Temp 07/03/16 0946 97.7 F (36.5 C)     Temp Source 07/03/16 0946 Oral     SpO2 07/03/16 0946 98 %     Weight 07/03/16 0947 170 lb (77.1 kg)     Height 07/03/16 0947 5\' 9"  (1.753 m)     Head Circumference --      Peak Flow --      Pain Score 07/03/16 0947 6     Pain Loc --      Pain Edu? --      Excl. in GC? --     Constitutional: Alert and oriented. Well appearing and in no distress. Eyes: Conjunctivae are normal. PERRL. Normal extraocular movements. ENT   Head: Normocephalic, Left-sided parietal scalp hematoma and abrasion   Nose: No congestion/rhinnorhea.   Mouth/Throat: Mucous membranes are moist.   Neck: No stridor. Cardiovascular: Normal rate, regular rhythm. No murmurs, rubs, or gallops. Respiratory: Normal respiratory effort without tachypnea nor retractions. Breath sounds are clear and equal bilaterally. No wheezes/rales/rhonchi. Gastrointestinal: Soft and nontender. Normal bowel sounds Musculoskeletal: Nontender with normal range of motion  in all extremities. No lower extremity tenderness nor edema. Neurologic:  Normal speech and language. No gross focal neurologic deficits are appreciated.  Skin:  Skin is warm, dry and intact. No rash noted. Psychiatric: Mood and affect are normal. Speech and behavior are normal.  ____________________________________________  EKG: Interpreted by me. Sinus rhythm with a rate of 55 bpm, normal PR interval, normal QRS, normal QT, normal axis.  ____________________________________________  ED COURSE:  Pertinent labs & imaging results that were available during  my care of the patient were reviewed by me and considered in my medical decision making (see chart for details). Patient presents to ER in no distress after syncopal event and head injury. We will assess with labs and imaging.   Procedures ____________________________________________   LABS (pertinent positives/negatives)  Labs Reviewed  BASIC METABOLIC PANEL - Abnormal; Notable for the following:       Result Value   Glucose, Bld 115 (*)    All other components within normal limits  CBC  URINALYSIS, COMPLETE (UACMP) WITH MICROSCOPIC  CBG MONITORING, ED    RADIOLOGY Images were viewed by me  CT head IMPRESSION: Negative CT head ____________________________________________  FINAL ASSESSMENT AND PLAN  Syncope, head injury  Plan: Patient with labs and imaging as dictated above. Patient is in no distress and currently feeling better. Patient notes that he has been weaning down significantly his caffeine intake. Patient states he typically drinks a 2 L of normal do per day and has not had any in the last 24 hours. He also didn't eat this morning. Patient has eaten and feels better. He is stable for outpatient follow-up with his doctor.   Emily FilbertWilliams, Jonathan E, MD   Note: This note was generated in part or whole with voice recognition software. Voice recognition is usually quite accurate but there are transcription errors that can and very often do occur. I apologize for any typographical errors that were not detected and corrected.     Emily FilbertJonathan E Williams, MD 07/03/16 [phone]

## 2016-07-03 NOTE — ED Triage Notes (Signed)
Ems pt from work , syncope , fell backwards striking posterior scalp with hematoma, lac , bleeding controled . Pt complaining of headache. Ambulatory with a steady gait,

## 2016-07-03 NOTE — ED Notes (Signed)
Returned from CT.

## 2016-10-09 ENCOUNTER — Ambulatory Visit (INDEPENDENT_AMBULATORY_CARE_PROVIDER_SITE_OTHER): Payer: 59 | Admitting: Family Medicine

## 2016-10-09 ENCOUNTER — Encounter: Payer: Self-pay | Admitting: Family Medicine

## 2016-10-09 VITALS — BP 156/79 | HR 64 | Temp 98.4°F | Wt 169.0 lb

## 2016-10-09 DIAGNOSIS — I10 Essential (primary) hypertension: Secondary | ICD-10-CM

## 2016-10-09 DIAGNOSIS — M10031 Idiopathic gout, right wrist: Secondary | ICD-10-CM | POA: Diagnosis not present

## 2016-10-09 MED ORDER — COLCHICINE 0.6 MG PO TABS: 0.6000 mg | ORAL_TABLET | Freq: Two times a day (BID) | ORAL | 1 refills | Status: DC

## 2016-10-09 MED ORDER — FEBUXOSTAT 40 MG PO TABS: 40.0000 mg | ORAL_TABLET | Freq: Every day | ORAL | 1 refills | Status: DC

## 2016-10-09 MED ORDER — LISINOPRIL 10 MG PO TABS: 10.0000 mg | ORAL_TABLET | Freq: Every day | ORAL | 1 refills | Status: DC

## 2016-10-09 MED ORDER — PREDNISONE 10 MG PO TABS: ORAL_TABLET | ORAL | 0 refills | Status: DC

## 2016-10-09 MED ORDER — TRAMADOL HCL 50 MG PO TABS: 50.0000 mg | ORAL_TABLET | Freq: Three times a day (TID) | ORAL | 0 refills | Status: DC | PRN

## 2016-10-09 NOTE — Progress Notes (Signed)
BP (!) 156/79   Pulse 64   Temp 98.4 F (36.9 C)   Wt 169 lb (76.7 kg)   SpO2 96%   BMI 24.96 kg/m    Subjective:    Patient ID: Sean AdeLawrence E Schraeder Jr., male    DOB: 10-Apr-1966, 51 y.o.   MRN: [id number]  HPI: Sean AdeLawrence E Kirkwood Jr. is a 51 y.o. male  Chief Complaint  Patient presents with  . Gout    right wrist x 1 month. Been taking some prednisone he had and ibuprofen.    Patient presents with about 1 month of right wrist pain, redness, swelling, and heat. Long hx of gout, was being followed by Rheumatology for this but stopped going and taking his medications back in March. Has been working hard on dietary modifications - cut out shrimp and has reduced his sweets intake - notices that makes a significant difference especially the shrimp. Did have some leftover prednisone that he started taking and that seems to have helped some with current flare. Also taking some ibuprofen with mild relief.    Also having issues with elevated BPs. Taking amlodipine faithfully. Denies CP, SOB, HAs. States job is very stressful and this is likely causing his elevated BPs.   Past Medical History:  Diagnosis Date  . Allergic rhinitis   . Dysphagia   . Gout   . History of cellulitis 11/2013 and 8/15  . Hyperlipidemia   . Hypertension    Social History   Social History  . Marital status: Married    Spouse name: N/A  . Number of children: N/A  . Years of education: N/A   Occupational History  . Not on file.   Social History Main Topics  . Smoking status: Former Smoker    Years: 8.00    Types: Cigarettes    Quit date: 05/13/1996  . Smokeless tobacco: Never Used  . Alcohol use No  . Drug use: No  . Sexual activity: Not on file   Other Topics Concern  . Not on file   Social History Narrative  . No narrative on file   Relevant past medical, surgical, family and social history reviewed and updated as indicated. Interim medical history since our last visit reviewed. Allergies and  medications reviewed and updated.  Review of Systems  Constitutional: Negative.   HENT: Negative.   Eyes: Negative.   Respiratory: Negative.   Cardiovascular: Negative.   Gastrointestinal: Negative.   Genitourinary: Negative.   Musculoskeletal: Positive for arthralgias and joint swelling.  Neurological: Negative.   Psychiatric/Behavioral: Negative.    Per HPI unless specifically indicated above     Objective:    BP (!) 156/79   Pulse 64   Temp 98.4 F (36.9 C)   Wt 169 lb (76.7 kg)   SpO2 96%   BMI 24.96 kg/m   Wt Readings from Last 3 Encounters:  10/09/16 169 lb (76.7 kg)  07/03/16 170 lb (77.1 kg)  04/22/16 167 lb (75.8 kg)    Physical Exam  Constitutional: He is oriented to person, place, and time. He appears well-developed and well-nourished. No distress.  HENT:  Head: Atraumatic.  Eyes: Conjunctivae are normal. Pupils are equal, round, and reactive to light.  Neck: Normal range of motion. Neck supple.  Cardiovascular: Normal rate and normal heart sounds.   Pulmonary/Chest: Effort normal and breath sounds normal. No respiratory distress.  Musculoskeletal: Normal range of motion. He exhibits edema (mild edema right wrist) and tenderness (TTP over right wrist).  Neurological: He is alert and oriented to person, place, and time. No cranial nerve deficit.  Skin: Skin is warm and dry. No rash noted. No erythema.  Psychiatric: He has a normal mood and affect. His behavior is normal.  Nursing note and vitals reviewed.     Assessment & Plan:   Problem List Items Addressed This Visit      Cardiovascular and Mediastinum   Hypertension    Will add 10 mg lisinopril to amlodipine and recheck in 1 month. Check at home as he is able to and report abnormal readings in meantime.       Relevant Medications   lisinopril (PRINIVIL,ZESTRIL) 10 MG tablet     Other   Gout - Primary    Will start one week of colchicine, prednisone burst, and uloric daily long term. Follow up  for uric acid and CMP in 1 month. Call if no improvement in meantime       Continue dietary modifications   Follow up plan: Return in about 4 weeks (around 11/06/2016) for HTN, gout f/u.

## 2016-10-09 NOTE — Patient Instructions (Signed)
Start prednisone taper, uloric once daily, and colchicine twice daily Can take a tramadol as needed for pain  Start lisinopril alongside the amlodipine to help control your blood pressures.

## 2016-10-14 ENCOUNTER — Telehealth: Payer: Self-pay | Admitting: Family Medicine

## 2016-10-14 NOTE — Telephone Encounter (Signed)
error 

## 2016-10-14 NOTE — Assessment & Plan Note (Signed)
Will add 10 mg lisinopril to amlodipine and recheck in 1 month. Check at home as he is able to and report abnormal readings in meantime.

## 2016-10-15 NOTE — Assessment & Plan Note (Signed)
Will start one week of colchicine, prednisone burst, and uloric daily long term. Follow up for uric acid and CMP in 1 month. Call if no improvement in meantime

## 2016-11-06 ENCOUNTER — Ambulatory Visit (INDEPENDENT_AMBULATORY_CARE_PROVIDER_SITE_OTHER): Payer: 59 | Admitting: Family Medicine

## 2016-11-06 ENCOUNTER — Encounter: Payer: Self-pay | Admitting: Family Medicine

## 2016-11-06 VITALS — BP 155/92 | HR 76 | Temp 98.2°F | Wt 171.0 lb

## 2016-11-06 DIAGNOSIS — I10 Essential (primary) hypertension: Secondary | ICD-10-CM

## 2016-11-06 DIAGNOSIS — M10021 Idiopathic gout, right elbow: Secondary | ICD-10-CM | POA: Diagnosis not present

## 2016-11-06 MED ORDER — LISINOPRIL 20 MG PO TABS: 20.0000 mg | ORAL_TABLET | Freq: Every day | ORAL | 1 refills | Status: DC

## 2016-11-06 MED ORDER — PREDNISONE 10 MG PO TABS: ORAL_TABLET | ORAL | 0 refills | Status: DC

## 2016-11-06 NOTE — Progress Notes (Signed)
BP (!) 155/92 (BP Location: Left Arm)   Pulse 76   Temp 98.2 F (36.8 C)   Wt 171 lb (77.6 kg)   SpO2 98%   BMI 25.25 kg/m    Subjective:    Patient ID: Sean AdeLawrence E Hem Jr., male    DOB: 1965/08/10, 51 y.o.   MRN: [id number]  HPI: Sean AdeLawrence E Zingaro Jr. is a 51 y.o. male  Chief Complaint  Patient presents with  . Hypertension    added Lisinopril 10mg  last visit.   . Gout    he thinks the Uloric is making him have gout. His right arm joints/muscles have been hurting him alot.    Patient presents today for BP recheck after adding 10 mg lisinopril to amlodipine. Has been checking BPs at pharmacy occasionally and it's been about 160/80. Denies CP, HA, dizziness, syncope. No side effects with the medication.   Taking uloric daily, has not taken any colchicine since starting it last month. Does not feel it's working well for him as he has had increased myalgias and joint pain in right UE as well as swollen, red right wrist the last few days that seems to have improved today with NSAIDs and 3 remaining tablets of prednisone he had around. Took his tramadol once the past month. Was supposed to f/u with Rheumatology recently but did not go.   Past Medical History:  Diagnosis Date  . Allergic rhinitis   . Dysphagia   . Gout   . History of cellulitis 11/2013 and 8/15  . Hyperlipidemia   . Hypertension    Social History   Social History  . Marital status: Married    Spouse name: N/A  . Number of children: N/A  . Years of education: N/A   Occupational History  . Not on file.   Social History Main Topics  . Smoking status: Former Smoker    Years: 8.00    Types: Cigarettes    Quit date: 05/13/1996  . Smokeless tobacco: Never Used  . Alcohol use No  . Drug use: No  . Sexual activity: Not on file   Other Topics Concern  . Not on file   Social History Narrative  . No narrative on file   Relevant past medical, surgical, family and social history reviewed and updated as  indicated. Interim medical history since our last visit reviewed. Allergies and medications reviewed and updated.  Review of Systems  Constitutional: Negative.   HENT: Negative.   Eyes: Negative.   Respiratory: Negative.   Cardiovascular: Negative.   Gastrointestinal: Negative.   Genitourinary: Negative.   Musculoskeletal: Positive for arthralgias, joint swelling and myalgias.  Neurological: Negative.   Psychiatric/Behavioral: Negative.    Per HPI unless specifically indicated above     Objective:    BP (!) 155/92 (BP Location: Left Arm)   Pulse 76   Temp 98.2 F (36.8 C)   Wt 171 lb (77.6 kg)   SpO2 98%   BMI 25.25 kg/m   Wt Readings from Last 3 Encounters:  11/06/16 171 lb (77.6 kg)  10/09/16 169 lb (76.7 kg)  07/03/16 170 lb (77.1 kg)    Physical Exam  Constitutional: He is oriented to person, place, and time. He appears well-developed and well-nourished. No distress.  HENT:  Head: Atraumatic.  Eyes: Conjunctivae are normal. Pupils are equal, round, and reactive to light.  Neck: Normal range of motion. Neck supple.  Cardiovascular: Normal rate and normal heart sounds.   Pulmonary/Chest: Effort normal and  breath sounds normal. No respiratory distress.  Musculoskeletal: Normal range of motion. He exhibits tenderness (mild ttp right wrist). He exhibits no edema or deformity.  Neurological: He is alert and oriented to person, place, and time.  Skin: Skin is warm and dry.  Psychiatric: He has a normal mood and affect. His behavior is normal.  Nursing note and vitals reviewed.  Results for orders placed or performed in visit on 11/06/16  Uric acid  Result Value Ref Range   Uric Acid 5.3 3.7 - 8.6 mg/dL  Comprehensive metabolic panel  Result Value Ref Range   Glucose 211 (H) 65 - 99 mg/dL   BUN 14 6 - 24 mg/dL   Creatinine, Ser 9.14 0.76 - 1.27 mg/dL   GFR calc non Af Amer 72 >59 mL/min/1.73   GFR calc Af Amer 84 >59 mL/min/1.73   BUN/Creatinine Ratio 12 9 - 20     Sodium 140 134 - 144 mmol/L   Potassium 4.5 3.5 - 5.2 mmol/L   Chloride 103 96 - 106 mmol/L   CO2 21 20 - 29 mmol/L   Calcium 9.5 8.7 - 10.2 mg/dL   Total Protein 7.0 6.0 - 8.5 g/dL   Albumin 4.2 3.5 - 5.5 g/dL   Globulin, Total 2.8 1.5 - 4.5 g/dL   Albumin/Globulin Ratio 1.5 1.2 - 2.2   Bilirubin Total 0.4 0.0 - 1.2 mg/dL   Alkaline Phosphatase 84 39 - 117 IU/L   AST 18 0 - 40 IU/L   ALT 18 0 - 44 IU/L      Assessment & Plan:   Problem List Items Addressed This Visit      Cardiovascular and Mediastinum   Hypertension    Not at goal, will increase to 20 mg lisinopril. Continue occasional monitoring. Will recheck in 1 month. Await CMP results.       Relevant Medications   lisinopril (PRINIVIL,ZESTRIL) 20 MG tablet     Musculoskeletal and Integument   Acute gout of right elbow - Primary    Will recheck uric acid and CMP today since addition of uloric this past month. Will start prednisone and refill colchicine in case needed for new flares. Tramadol prn for severe discomfort. F/u with Rheumatology for further investigation into continued flares.       Relevant Medications   predniSONE (DELTASONE) 10 MG tablet   Other Relevant Orders   Ambulatory referral to Rheumatology   Uric acid (Completed)   Comprehensive metabolic panel (Completed)       Follow up plan: Return in about 4 weeks (around 12/04/2016) for BP recheck.

## 2016-11-07 ENCOUNTER — Telehealth: Payer: Self-pay | Admitting: Family Medicine

## 2016-11-07 LAB — COMPREHENSIVE METABOLIC PANEL
ALBUMIN: 4.2 g/dL (ref 3.5–5.5)
ALT: 18 IU/L (ref 0–44)
AST: 18 IU/L (ref 0–40)
Albumin/Globulin Ratio: 1.5 (ref 1.2–2.2)
Alkaline Phosphatase: 84 IU/L (ref 39–117)
BUN / CREAT RATIO: 12 (ref 9–20)
BUN: 14 mg/dL (ref 6–24)
Bilirubin Total: 0.4 mg/dL (ref 0.0–1.2)
CALCIUM: 9.5 mg/dL (ref 8.7–10.2)
CO2: 21 mmol/L (ref 20–29)
CREATININE: 1.17 mg/dL (ref 0.76–1.27)
Chloride: 103 mmol/L (ref 96–106)
GFR calc Af Amer: 84 mL/min/{1.73_m2} (ref >59)
GFR, EST NON AFRICAN AMERICAN: 72 mL/min/{1.73_m2} (ref >59)
GLOBULIN, TOTAL: 2.8 g/dL (ref 1.5–4.5)
Glucose: 211 mg/dL — ABNORMAL HIGH (ref 65–99)
Potassium: 4.5 mmol/L (ref 3.5–5.2)
SODIUM: 140 mmol/L (ref 134–144)
Total Protein: 7 g/dL (ref 6.0–8.5)

## 2016-11-07 LAB — URIC ACID: Uric Acid: 5.3 mg/dL (ref 3.7–8.6)

## 2016-11-07 NOTE — Assessment & Plan Note (Addendum)
Will recheck uric acid and CMP today since addition of uloric this past month. Will start prednisone and refill colchicine in case needed for new flares. Tramadol prn for severe discomfort. F/u with Rheumatology for further investigation into continued flares.

## 2016-11-07 NOTE — Assessment & Plan Note (Signed)
Not at goal, will increase to 20 mg lisinopril. Continue occasional monitoring. Will recheck in 1 month. Await CMP results.

## 2016-11-07 NOTE — Telephone Encounter (Signed)
Patient notified

## 2016-11-07 NOTE — Telephone Encounter (Signed)
Please call and let him know that his uric acid is actually at goal despite the pain he is having so it's likely from some other cause. He should proceed back to rheumatology for further investigation of his right UE joint pains as he is overdue for f/u with them anyway. Continue the Uloric as he has been

## 2016-12-15 ENCOUNTER — Emergency Department
Admission: EM | Admit: 2016-12-15 | Discharge: 2016-12-15 | Disposition: A | Discharge status: Home / Self Care | Payer: Self-pay | Attending: Emergency Medicine | Admitting: Emergency Medicine

## 2016-12-15 ENCOUNTER — Encounter: Payer: Self-pay | Admitting: Emergency Medicine

## 2016-12-15 DIAGNOSIS — I10 Essential (primary) hypertension: Secondary | ICD-10-CM | POA: Insufficient documentation

## 2016-12-15 DIAGNOSIS — Z79899 Other long term (current) drug therapy: Secondary | ICD-10-CM | POA: Insufficient documentation

## 2016-12-15 DIAGNOSIS — M109 Gout, unspecified: Secondary | ICD-10-CM

## 2016-12-15 DIAGNOSIS — Z87891 Personal history of nicotine dependence: Secondary | ICD-10-CM | POA: Insufficient documentation

## 2016-12-15 DIAGNOSIS — M10071 Idiopathic gout, right ankle and foot: Secondary | ICD-10-CM | POA: Insufficient documentation

## 2016-12-15 MED ORDER — ONDANSETRON 4 MG PO TBDP
ORAL_TABLET | ORAL | Status: AC
  Administered 2016-12-15: 4 mg via ORAL
  Filled 2016-12-15: qty 1

## 2016-12-15 MED ORDER — ONDANSETRON 4 MG PO TBDP
4.0000 mg | ORAL_TABLET | Freq: Once | ORAL | Status: AC
  Administered 2016-12-15: 4 mg via ORAL

## 2016-12-15 MED ORDER — METHYLPREDNISOLONE SODIUM SUCC 125 MG IJ SOLR
125.0000 mg | Freq: Once | INTRAMUSCULAR | Status: AC
  Administered 2016-12-15: 125 mg via INTRAMUSCULAR
  Filled 2016-12-15: qty 2

## 2016-12-15 MED ORDER — COLCHICINE 0.6 MG PO TABS: 0.6000 mg | ORAL_TABLET | Freq: Two times a day (BID) | ORAL | 0 refills | Status: DC

## 2016-12-15 MED ORDER — COLCHICINE 0.6 MG PO TABS
1.2000 mg | ORAL_TABLET | Freq: Once | ORAL | Status: AC
  Administered 2016-12-15: 1.2 mg via ORAL
  Filled 2016-12-15: qty 2

## 2016-12-15 NOTE — Discharge Instructions (Signed)
Take one more cochicine today when you get home. Continue home prescription of prednisone.

## 2016-12-15 NOTE — ED Notes (Signed)
FIRST NURSE NOTE:  Pt c/o pain of gout and affecting his ability to walk. Needs refill on gout meds

## 2016-12-15 NOTE — ED Provider Notes (Signed)
[facility] Emergency Department Provider Note  ____________________________________________  Time seen: Approximately 1:30 PM  I have reviewed the triage vital signs and the nursing notes.   HISTORY  Chief Complaint Foot Pain    HPI Sean AdeLawrence E Zetina Jr. is a 51 y.o. male that presents to the emergency department with right ankle pain since yesterday. Patient has a history of gout and this feels the same. He is having difficulty bearing weight on that foot.  Patient gout flares are usually in the right ankle or right wrist. He took a dose of prednisone last night. He ran out of his colchicine so he has not taken that. Solumedrol has helped symptoms quickly in the past. No trauma or wound. Patient has vacation bible school tomorrow and wants to be able to walk and participate. He denies shortness breath, chest pain, nausea, vomiting, abdominal pain.   Past Medical History:  Diagnosis Date  . Allergic rhinitis   . Dysphagia   . Gout   . History of cellulitis 11/2013 and 8/15  . Hyperlipidemia   . Hypertension     Patient Active Problem List   Diagnosis Date Noted  . Acute gout of right elbow 06/13/2015  . Infrapatellar bursitis of left knee 06/13/2015  . Hx of melanoma of skin 01/31/2015  . Acromioclavicular joint pain 01/31/2015  . Weight loss, unintentional 01/31/2015  . Hyperlipidemia   . Hypertension   . Gout   . Dysphagia     Past Surgical History:  Procedure Laterality Date  . HIATAL HERNIA REPAIR     x3  . KNEE SURGERY      Prior to Admission medications   Medication Sig Start Date End Date Taking? Authorizing Provider  amLODipine (NORVASC) 10 MG tablet Take 1 tablet (10 mg total) by mouth daily. 03/28/15   Johnson, Megan P, DO  colchicine 0.6 MG tablet Take 1 tablet (0.6 mg total) by mouth 2 (two) times daily. 12/15/16 12/15/17  Enid DerryWagner, Marlet Korte, PA-C  febuxostat (ULORIC) 40 MG tablet Take 1 tablet (40 mg total) by mouth daily. 10/09/16    Particia NearingLane, Rachel Elizabeth, PA-C  hydrALAZINE (APRESOLINE) 10 MG tablet Take 1 tablet (10 mg total) by mouth 3 (three) times daily. If needed for high blood pressure Patient not taking: Reported on 07/03/2016 06/13/15   Kerman PasseyLada, Melinda P, MD  lisinopril (PRINIVIL,ZESTRIL) 20 MG tablet Take 1 tablet (20 mg total) by mouth daily. 11/06/16   Particia NearingLane, Rachel Elizabeth, PA-C  predniSONE (DELTASONE) 10 MG tablet Take 6 tabs day one, 5 tabs day two, 4 tabs day three, etc 11/06/16   Particia NearingLane, Rachel Elizabeth, PA-C  traMADol (ULTRAM) 50 MG tablet Take 1 tablet (50 mg total) by mouth every 8 (eight) hours as needed. 10/09/16   Particia NearingLane, Rachel Elizabeth, PA-C    Allergies Patient has no known allergies.  Family History  Problem Relation Age of Onset  . Hypertension Father   . Diabetes Father   . Stroke Mother   . Hypertension Paternal Grandmother   . Myasthenia gravis Unknown        grandfather  . Cancer Maternal Grandmother     Social History Social History  Substance Use Topics  . Smoking status: Former Smoker    Years: 8.00    Types: Cigarettes    Quit date: 05/13/1996  . Smokeless tobacco: Never Used  . Alcohol use No     Review of Systems  Constitutional: No fever/chills Cardiovascular: No chest pain. Respiratory:  No SOB. Gastrointestinal: No  abdominal pain.  No nausea, no vomiting.  Musculoskeletal: Positive for foot pain. Skin: Negative for abrasions, lacerations, ecchymosis. Neurological: Negative for headaches, numbness or tingling   ____________________________________________   PHYSICAL EXAM:  VITAL SIGNS: ED Triage Vitals  Enc Vitals Group     BP 12/15/16 1231 (!) 141/90     Pulse Rate 12/15/16 1229 92     Resp 12/15/16 1229 16     Temp 12/15/16 1229 98.6 F (37 C)     Temp Source 12/15/16 1229 Oral     SpO2 12/15/16 1229 95 %     Weight --      Height --      Head Circumference --      Peak Flow --      Pain Score 12/15/16 1229 10     Pain Loc --      Pain Edu? --       Excl. in GC? --      Constitutional: Alert and oriented. Well appearing and in no acute distress. Eyes: Conjunctivae are normal. PERRL. EOMI. Head: Atraumatic. ENT:      Ears:      Nose: No congestion/rhinnorhea.      Mouth/Throat: Mucous membranes are moist.  Neck: No stridor.  Cardiovascular: Normal rate, regular rhythm.  Good peripheral circulation. 2+ dorsalis pedis pulses. Respiratory: Normal respiratory effort without tachypnea or retractions. Lungs CTAB. Good air entry to the bases with no decreased or absent breath sounds. Musculoskeletal: Full range of motion to all extremities. No gross deformities appreciated. Neurologic:  Normal speech and language. No gross focal neurologic deficits are appreciated.  Skin:  Skin is warm, dry and intact. Redness and warmth to lateral malleolus. Limited range of motion of right ankle.    ____________________________________________   LABS (all labs ordered are listed, but only abnormal results are displayed)  Labs Reviewed - No data to display ____________________________________________  EKG   ____________________________________________  RADIOLOGY  No results found.  ____________________________________________    PROCEDURES  Procedure(s) performed:    Procedures    Medications  methylPREDNISolone sodium succinate (SOLU-MEDROL) 125 mg/2 mL injection 125 mg (125 mg Intramuscular Given 12/15/16 1347)  ondansetron (ZOFRAN-ODT) disintegrating tablet 4 mg (4 mg Oral Given 12/15/16 1357)  colchicine tablet 1.2 mg (1.2 mg Oral Given 12/15/16 1447)     ____________________________________________   INITIAL IMPRESSION / ASSESSMENT AND PLAN / ED COURSE  Pertinent labs & imaging results that were available during my care of the patient were reviewed by me and considered in my medical decision making (see chart for details).  Review of the [place] CSRS was performed in accordance of the [facility] prior to dispensing any controlled  drugs.   Patient's diagnosis is consistent with Gout. Vital signs and exam are reassuring. This feels the same as previous gout flares. Patient is out of his colchicine. IM Solu-Medrol and colchicine were given. Patient felt nauseous after Solu-Medrol but this resolved quickly. Patient will be discharged home with prescriptions for colchicine. Patient is to follow up with PCP as directed. Patient is given ED precautions to return to the ED for any worsening or new symptoms.     ____________________________________________  FINAL CLINICAL IMPRESSION(S) / ED DIAGNOSES  Final diagnoses:  Acute gout of right ankle, unspecified cause      NEW MEDICATIONS STARTED DURING THIS VISIT:  Discharge Medication List as of 12/15/2016  2:38 PM          This chart was dictated using voice recognition software/Dragon. Despite  best efforts to proofread, errors can occur which can change the meaning. Any change was purely unintentional.    Enid DerryWagner, Lamae Fosco, PA-C 12/16/16 1413    Alphonzo LemmingsMcShane, Rudy JewJames A, MD 12/16/16 [id number]

## 2016-12-15 NOTE — ED Triage Notes (Signed)
Pt states that he is having RIGHT foot pain since yesterday. Pt states that he has hx/o gout and this feels similar. Pt states that he is unable to put weight on his foot.

## 2017-01-06 ENCOUNTER — Telehealth: Payer: Self-pay | Admitting: Family Medicine

## 2017-01-06 MED ORDER — PREDNISONE 10 MG PO TABS: ORAL_TABLET | ORAL | 0 refills | Status: DC

## 2017-01-06 NOTE — Telephone Encounter (Signed)
His appt with rheumatology is 02/17/17.

## 2017-01-06 NOTE — Telephone Encounter (Signed)
Spoke with patient, advised we were calling in Prednisone. Patient also states that he had a tick bite a couple of weeks ago. Per Fleet Contras, she'd like to see him to do a tick fever work up. Appt. Scheduled for Wed. And advised to go ahead and start the prednisone.

## 2017-01-06 NOTE — Telephone Encounter (Signed)
Patient has been aching all over his body and having pain in his joints. Patient would like to speak with CMA or provider about what he should do.  Please Advise.  Thank you

## 2017-01-06 NOTE — Telephone Encounter (Signed)
Routing to provider  

## 2017-01-06 NOTE — Telephone Encounter (Signed)
Sent in prednisone taper for him, he should come in if no relief after that

## 2017-01-08 ENCOUNTER — Ambulatory Visit: Payer: Self-pay | Admitting: Family Medicine

## 2017-02-07 NOTE — Progress Notes (Addendum)
Office Visit Note  Patient: Sean Delacruz.             Date of Birth: 1965-08-08           MRN: [id number]             PCP: Steele Sizer, MD Referring: Particia Nearing,* Visit Date: 02/17/2017 Occupation: @    Subjective:  Treatment of gout. Knee pain.   History of Present Illness: Sean Delacruz. is a 51 y.o. male seen in consultation per request of his PCP for evaluation of gout. According to patient his symptoms started about 2 years ago with right ankle pain and swelling. He states at that time his PCP diagnosed her with gout and gave him prednisone and the symptoms resolved. He recalls his uric acid levels were high but he does not recall how much. A few weeks later after eating shrimp he has recurrence of gout. He realizes that every time he ate shrimp he had a gout flare so he started avoiding shrimp. He also realizes certain fried food gave him flares. He was on colchicine on when necessary basis for several years. He recalls trying allopurinol and later Uloric but discontinued the medications as he felt that there were more flares during the time he was taking those medications. He states she's been taking colchicine and prednisone on when necessary basis since then. June 2018 he started having frequent flares almost once every week. He reports having flares in his bilateral knee joints, bilateral ankles, bilateral great toe, bilateral elbows and right wrist joint. His last flare was in his right knee. He states he finished prednisone 3 days ago. He denies any history of tophi. He does not drink any alcohol. He does eat red meat. He has an infected tooth right now. He is going to his dentist after this visit to get antibiotics.  Activities of Daily Living:  Patient reports morning stiffness for 5 minutes.   Patient Reports nocturnal pain.  Difficulty dressing/grooming: Reports Difficulty climbing stairs: Reports Difficulty getting out of chair:  Reports Difficulty using hands for taps, buttons, cutlery, and/or writing: Reports   Review of Systems  Constitutional: Negative for activity change, appetite change, fatigue and weakness.  HENT: Negative for mouth dryness.   Eyes: Negative for dryness.  Respiratory: Negative for shortness of breath.   Cardiovascular: Negative for swelling in legs/feet.  Gastrointestinal: Negative for diarrhea.  Endocrine: Negative for increased urination.  Genitourinary: Negative for painful urination.  Musculoskeletal: Positive for arthralgias, joint pain, joint swelling and morning stiffness. Negative for gait problem, muscle weakness and muscle tenderness.  Skin: Negative for redness.  Neurological: Negative for numbness.  Hematological: Negative for bruising/bleeding tendency.  Psychiatric/Behavioral: Negative for sleep disturbance.    PMFS History:  Patient Active Problem List   Diagnosis Date Noted  . Acute gout of right elbow 06/13/2015  . Infrapatellar bursitis of left knee 06/13/2015  . Hx of melanoma of skin 01/31/2015  . Acromioclavicular joint pain 01/31/2015  . Weight loss, unintentional 01/31/2015  . Hyperlipidemia   . Hypertension   . Gout   . Dysphagia     Past Medical History:  Diagnosis Date  . Allergic rhinitis   . Dysphagia   . Gout   . History of cellulitis 11/2013 and 8/15  . Hyperlipidemia   . Hypertension     Family History  Problem Relation Age of Onset  . Hypertension Father   . Diabetes Father   .  Stroke Mother   . Hypertension Paternal Grandmother   . Myasthenia gravis Unknown        grandfather  . Cancer Maternal Grandmother    Past Surgical History:  Procedure Laterality Date  . HIATAL HERNIA REPAIR     x3  . KNEE ARTHROPLASTY    . KNEE SURGERY     Social History   Social History Narrative  . No narrative on file     Objective: Vital Signs: BP (!) 146/80 (BP Location: Left Arm, Patient Position: Sitting, Cuff Size: Normal)   Pulse 70    Resp 14   Ht  (1.753 m)   Wt 170 lb (77.1 kg)   BMI 25.10 kg/m    Physical Exam  Constitutional: He is oriented to person, place, and time. He appears well-developed and well-nourished.  HENT:  Head: Normocephalic and atraumatic.  Eyes: Pupils are equal, round, and reactive to light. Conjunctivae and EOM are normal.  Neck: Normal range of motion. Neck supple.  Cardiovascular: Normal rate, regular rhythm and normal heart sounds.   Pulmonary/Chest: Effort normal and breath sounds normal.  Abdominal: Soft. Bowel sounds are normal.  Neurological: He is alert and oriented to person, place, and time.  Skin: Skin is warm and dry. Capillary refill takes less than 2 seconds.  Psychiatric: He has a normal mood and affect. His behavior is normal.  Nursing note and vitals reviewed.    Musculoskeletal Exam: C-spine and thoracic lumbar spine good range of motion. Shoulder joints although joints wrist joint MCPs PIPs DIPs with good range of motion with no synovitis. He has some DIP PIP thickening in his hands consistent with osteoarthritis. Hip joints knee joints ankles MTPs PIPs DIPs with good range of motion with no synovitis. He has some warmth on palpation of his right knee joint. No effusion or swelling was noted.  CDAI Exam: No CDAI exam completed.    Investigation: No additional findings. CBC Latest Ref Rng & Units 07/03/2016 03/20/2015  WBC 3.8 - 10.6 K/uL 5.8 9.6  Hemoglobin 13.0 - 18.0 g/dL 16.1 09.6  Hematocrit 04.5 - 52.0 % 41.3 43.9  Platelets 150 - 440 K/uL 224 294     CMP Latest Ref Rng & Units 11/06/2016 07/03/2016 03/20/2015  Glucose 65 - 99 mg/dL 409(W) 119(J) 93  BUN 6 - 24 mg/dL Creatinine 0.76 - 1.27 mg/dL 4.78 2.95 6.21  Sodium 134 - 144 mmol/L 140 139 139  Potassium 3.5 - 5.2 mmol/L 4.5 4.0 4.5  Chloride 96 - 106 mmol/L 103 109 100  CO2 20 - 29 mmol/L Calcium 8.7 - 10.2 mg/dL 9.5 8.9 9.3  Total Protein 6.0 - 8.5 g/dL 7.0 - 7.2  Total  Bilirubin 0.0 - 1.2 mg/dL 0.4 - 0.6  Alkaline Phos 39 - 117 IU/L 84 - 84  AST 0 - 40 IU/L 18 - 15  ALT 0 - 44 IU/L 18 - 21  Uric acid 5.3 03/20/2015 Uric acid 6.0  Imaging: Xr Foot 2 Views Left  Result Date: 02/17/2017 First MTP and first PIP narrowing was noted. None of the other MTPs of PIP showed narrowing. There was an erosion over the first phalanx. 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  Xr Foot 2 Views Right  Result Date: 02/17/2017 First MTP and PIP narrowing minimal PIP DIP narrowing of the other joints. No intertarsal joint space narrowing was noted. No erosive changes were noted.  Impression these findings are consistent with osteoarthritis of the foot.  Xr Hand 2 View Left  Result Date: 02/17/2017 Left first MCP narrowing. Left first PIP narrowing. Minimal PIP/DIP narrowing of the other joints. No intercarpal joint space narrowing was noted. No erosive changes were noted. Impression: These findings are consistent with osteoarthritis and inflammatory arthritis overlap.  Xr Hand 2 View Right  Result Date: 02/17/2017 Right first second and third MCP narrowing was noted. Some PIP/DIP narrowing was noted. No intercarpal joint space narrowing was noted. No erosive changes were noted. 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  Xr Knee 3 View Left  Result Date: 02/17/2017 Moderate medial compartment narrowing was noted. No chondrocalcinosis was noted. No patellofemoral narrowing was noted. Impression: These findings are consistent with moderate osteoarthritis of the knee joint.  Xr Knee 3 View Right  Result Date: 02/17/2017 Moderate medial compartment narrowing was noted. No chondrocalcinosis was noted. No patellofemoral narrowing was noted. Impression: These findings are consistent with moderate osteoarthritis of the knee  joint.   Speciality Comments: No specialty comments available.    Procedures:  No procedures performed Allergies: Patient has no known allergies.   Assessment / Plan:     Visit Diagnoses: Idiopathic chronic gout of multiple sites without tophus: Patient gives history of recurrent gout for multiple years. He's been having more frequent flares in the last 6 months. He reports having flares every week requiring prednisone and colchicine. His most recent flare was in his right knee. Detailed counseling regarding gout was provided. Pharmacological and dietary management of gout was also discussed at length. My plan is to start him on colchicine 0.6 mg by mouth twice a day if tolerated otherwise he can do once a day. After 2 weeks of taking colchicine he will start on allopurinol 100 mg by mouth daily and take it for one month. After one month of allopurinol we will check his labs CBC CMP and uric acid and then increase his allopurinol 200 mg by mouth daily. The plan is to increase it to 300 mg by mouth daily after he tolerates the 200 mg. He's been advised not to stop colchicine until we tell him to do so by: Is to keep his uric acid below 6 around 4.0. I will give him prednisone taper as needed.    Acute pain of right knee: He still have mild warmth on palpation of his right knee no effusion was noted.  Chronic pain of both knees. He complains of some discomfort in his knee joints off and on. - Plan: Rheumatoid factor, Cyclic citrul peptide antibody, IgG, Uric acid, XR KNEE 3 VIEW RIGHT, XR KNEE 3 VIEW LEFT. The x-rays revealed moderate osteoarthritis .  Pain in both hands. His stiffness in his hands. - Plan: XR Hand 2 View Right, XR Hand 2 View Left. The x-rays were consistent with osteoarthritis and and inflammatory arthritis overlap  Pain in both feet. He gets frequent flares since feet. - Plan: XR Foot 2 Views Right, XR Foot 2 Views Left. X-ray of bilateral feet showed osteoarthritic changes  with some erosive changes consistent with inflammatory arthritis most likely gout.  Other fatigue - Plan: CBC with Differential/Platelet, COMPLETE METABOLIC PANEL WITH GFR. His white cell count may come high due to the tooth infection .  Hx of melanoma of skin  History of hypertension: His blood pressure is elevated. He is in a lot of discomfort from a tooth infection. His appointment with  dentist today.  History of hyperlipidemia    Orders: Orders Placed This Encounter  Procedures  . XR Hand 2 View Right  . XR Hand 2 View Left  . XR KNEE 3 VIEW RIGHT  . XR KNEE 3 VIEW LEFT  . XR Foot 2 Views Right  . XR Foot 2 Views Left  . CBC with Differential/Platelet  . COMPLETE METABOLIC PANEL WITH GFR  . Rheumatoid factor  . Cyclic citrul peptide antibody, IgG  . Uric acid  . CBC with Differential/Platelet  . COMPLETE METABOLIC PANEL WITH GFR  . Uric acid   Meds ordered this encounter  Medications  . colchicine 0.6 MG tablet    Sig: Take 1 tablet (0.6 mg total) by mouth 2 (two) times daily.    Dispense:  180 tablet    Refill:  0  . allopurinol (ZYLOPRIM) 100 MG tablet    Sig: 1 tablet daily x 1 month, then 2 tablets daily x 1 month, then 3 tablets daily    Dispense:  180 tablet    Refill:  0    Face-to-face time spent with patient was 50 minutes. Greater than 50% of time was spent in counseling and coordination of care.  Follow-Up Instructions: Return in about 3 months (around 05/20/2017) for Gout.   Pollyann Savoy, MD  Note - This record has been created using Animal nutritionist.  Chart creation errors have been sought, but may not always  have been located. Such creation errors do not reflect on  the standard of medical care.

## 2017-02-17 ENCOUNTER — Ambulatory Visit (INDEPENDENT_AMBULATORY_CARE_PROVIDER_SITE_OTHER): Payer: Self-pay

## 2017-02-17 ENCOUNTER — Encounter: Payer: Self-pay | Admitting: Family Medicine

## 2017-02-17 ENCOUNTER — Ambulatory Visit (INDEPENDENT_AMBULATORY_CARE_PROVIDER_SITE_OTHER): Payer: 59

## 2017-02-17 ENCOUNTER — Ambulatory Visit (INDEPENDENT_AMBULATORY_CARE_PROVIDER_SITE_OTHER): Payer: 59 | Admitting: Rheumatology

## 2017-02-17 ENCOUNTER — Encounter: Payer: Self-pay | Admitting: Rheumatology

## 2017-02-17 ENCOUNTER — Telehealth: Payer: Self-pay | Admitting: Rheumatology

## 2017-02-17 ENCOUNTER — Ambulatory Visit (INDEPENDENT_AMBULATORY_CARE_PROVIDER_SITE_OTHER): Payer: 59 | Admitting: Family Medicine

## 2017-02-17 VITALS — BP 146/81 | HR 74 | Temp 98.6°F | Ht 69.0 in | Wt 168.6 lb

## 2017-02-17 VITALS — BP 146/80 | HR 70 | Resp 14 | Ht 69.0 in | Wt 170.0 lb

## 2017-02-17 DIAGNOSIS — G8929 Other chronic pain: Secondary | ICD-10-CM

## 2017-02-17 DIAGNOSIS — M1A09X0 Idiopathic chronic gout, multiple sites, without tophus (tophi): Secondary | ICD-10-CM | POA: Diagnosis not present

## 2017-02-17 DIAGNOSIS — M25562 Pain in left knee: Secondary | ICD-10-CM | POA: Diagnosis not present

## 2017-02-17 DIAGNOSIS — M79671 Pain in right foot: Secondary | ICD-10-CM

## 2017-02-17 DIAGNOSIS — M79642 Pain in left hand: Secondary | ICD-10-CM

## 2017-02-17 DIAGNOSIS — M79672 Pain in left foot: Secondary | ICD-10-CM

## 2017-02-17 DIAGNOSIS — M25561 Pain in right knee: Secondary | ICD-10-CM

## 2017-02-17 DIAGNOSIS — K047 Periapical abscess without sinus: Secondary | ICD-10-CM | POA: Diagnosis not present

## 2017-02-17 DIAGNOSIS — Z79899 Other long term (current) drug therapy: Secondary | ICD-10-CM | POA: Diagnosis not present

## 2017-02-17 DIAGNOSIS — M79641 Pain in right hand: Secondary | ICD-10-CM | POA: Diagnosis not present

## 2017-02-17 DIAGNOSIS — Z8639 Personal history of other endocrine, nutritional and metabolic disease: Secondary | ICD-10-CM

## 2017-02-17 DIAGNOSIS — Z85820 Personal history of malignant melanoma of skin: Secondary | ICD-10-CM | POA: Diagnosis not present

## 2017-02-17 DIAGNOSIS — R5383 Other fatigue: Secondary | ICD-10-CM | POA: Diagnosis not present

## 2017-02-17 DIAGNOSIS — Z8679 Personal history of other diseases of the circulatory system: Secondary | ICD-10-CM

## 2017-02-17 MED ORDER — CLINDAMYCIN HCL 300 MG PO CAPS: 300.0000 mg | ORAL_CAPSULE | Freq: Two times a day (BID) | ORAL | 0 refills | Status: DC

## 2017-02-17 MED ORDER — ALLOPURINOL 100 MG PO TABS: ORAL_TABLET | ORAL | 0 refills | Status: DC

## 2017-02-17 MED ORDER — COLCHICINE 0.6 MG PO TABS: 0.6000 mg | ORAL_TABLET | Freq: Two times a day (BID) | ORAL | 0 refills | Status: DC

## 2017-02-17 NOTE — Patient Instructions (Addendum)
Start taking colchicine 0.6 mg twice a day and continue. If you develop diarrhea, then take it only once a day. Start allopurinol 100 mg tablet once a day after one week. Do not stop allopurinol even if you have flares. Check CBC, CMP, uric acid in one month, at 2 months, and at 3 months  Plan is to increase allopurinol to 200 mg after 1 month if your labs are normal and then to 300 mg a day if you're labs are normal.   Avoid all red meat and shellfish.   If you have a flare please notify us so we can give you a prednisone taper while your increasing your allopurinol.  Gout Gout is painful swelling that can occur in some of your joints. Gout is a type of arthritis. This condition is caused by having too much uric acid in your body. Uric acid is a chemical that forms when your body breaks down substances called purines. Purines are important for building body proteins. When your body has too much uric acid, sharp crystals can form and build up inside your joints. This causes pain and swelling. Gout attacks can happen quickly and be very painful (acute gout). Over time, the attacks can affect more joints and become more frequent (chronic gout). Gout can also cause uric acid to build up under your skin and inside your kidneys. What are the causes? This condition is caused by too much uric acid in your blood. This can occur because:  Your kidneys do not remove enough uric acid from your blood. This is the most common cause.  Your body makes too much uric acid. This can occur with some cancers and cancer treatments. It can also occur if your body is breaking down too many red blood cells (hemolytic anemia).  You eat too many foods that are high in purines. These foods include organ meats and some seafood. Alcohol, especially beer, is also high in purines.  A gout attack may be triggered by trauma or stress. What increases the risk? This condition is more likely to develop in people  who:  Have a family history of gout.  Are male and middle-aged.  Are male and have gone through menopause.  Are obese.  Frequently drink alcohol, especially beer.  Are dehydrated.  Lose weight too quickly.  Have an organ transplant.  Have lead poisoning.  Take certain medicines, including aspirin, cyclosporine, diuretics, levodopa, and niacin.  Have kidney disease or psoriasis.  What are the signs or symptoms? An attack of acute gout happens quickly. It usually occurs in just one joint. The most common place is the big toe. Attacks often start at night. Other joints that may be affected include joints of the feet, ankle, knee, fingers, wrist, or elbow. Symptoms may include:  Severe pain.  Warmth.  Swelling.  Stiffness.  Tenderness. The affected joint may be very painful to touch.  Shiny, red, or purple skin.  Chills and fever.  Chronic gout may cause symptoms more frequently. More joints may be involved. You may also have white or yellow lumps (tophi) on your hands or feet or in other areas near your joints. How is this diagnosed? This condition is diagnosed based on your symptoms, medical history, and physical exam. You may have tests, such as:  Blood tests to measure uric acid levels.  Removal of joint fluid with a needle (aspiration) to look for uric acid crystals.  X-rays to look for joint damage.  How is this treated?  Treatment for this condition has two phases: treating an acute attack and preventing future attacks. Acute gout treatment may include medicines to reduce pain and swelling, including:  NSAIDs.  Steroids. These are strong anti-inflammatory medicines that can be taken by mouth (orally) or injected into a joint.  Colchicine. This medicine relieves pain and swelling when it is taken soon after an attack. It can be given orally or through an IV tube.  Preventive treatment may include:  Daily use of smaller doses of NSAIDs or  colchicine.  Use of a medicine that reduces uric acid levels in your blood.  Changes to your diet. You may need to see a specialist about healthy eating (dietitian).  Follow these instructions at home: During a Gout Attack  If directed, apply ice to the affected area: ? Put ice in a plastic bag. ? Place a towel between your skin and the bag. ? Leave the ice on for 20 minutes, 2-3 times a day.  Rest the joint as much as possible. If the affected joint is in your leg, you may be given crutches to use.  Raise (elevate) the affected joint above the level of your heart as often as possible.  Drink enough fluids to keep your urine clear or pale yellow.  Take over-the-counter and prescription medicines only as told by your health care provider.  Do not drive or operate heavy machinery while taking prescription pain medicine.  Follow instructions from your health care provider about eating or drinking restrictions.  Return to your normal activities as told by your health care provider. Ask your health care provider what activities are safe for you. Avoiding Future Gout Attacks  Follow a low-purine diet as told by your dietitian or health care provider. Avoid foods and drinks that are high in purines, including liver, kidney, anchovies, asparagus, herring, mushrooms, mussels, and beer.  Limit alcohol intake to no more than 1 drink a day for nonpregnant women and 2 drinks a day for men. One drink equals 12 oz of beer, 5 oz of wine, or 1 oz of hard liquor.  Maintain a healthy weight or lose weight if you are overweight. If you want to lose weight, talk with your health care provider. It is important that you do not lose weight too quickly.  Start or maintain an exercise program as told by your health care provider.  Drink enough fluids to keep your urine clear or pale yellow.  Take over-the-counter and prescription medicines only as told by your health care provider.  Keep all  follow-up visits as told by your health care provider. This is important. Contact a health care provider if:  You have another gout attack.  You continue to have symptoms of a gout attack after10 days of treatment.  You have side effects from your medicines.  You have chills or a fever.  You have burning pain when you urinate.  You have pain in your lower back or belly. Get help right away if:  You have severe or uncontrolled pain.  You cannot urinate. This information is not intended to replace advice given to you by your health care provider. Make sure you discuss any questions you have with your health care provider. Document Released: 04/26/2000 Document Revised: 10/05/2015 Document Reviewed: 02/09/2015 Elsevier Interactive Patient Education  2017 [employer].

## 2017-02-17 NOTE — Telephone Encounter (Signed)
Patient returning a call. °

## 2017-02-17 NOTE — Progress Notes (Signed)
   BP (!) 146/81 (BP Location: Left Arm, Patient Position: Sitting, Cuff Size: Normal)   Pulse 74   Temp 98.6 F (37 C)   Ht  (1.753 m)   Wt 168 lb 9.6 oz (76.5 kg)   SpO2 99%   BMI 24.90 kg/m    Subjective:    Patient ID: Sean Ade., male    DOB: Apr 14, 1966, 51 y.o.   MRN: [id number]  HPI: Sean Tuckey. is a 51 y.o. male  Chief Complaint  Patient presents with  . Dental Injury    Patient with history of dental issues. Tooth broke Thursday. Started noticing pain, swelling, redness Saturday.   . Jaw Pain   Patient presents with severe right jaw pain, redness, and swelling x 3 days. Broke a tooth on Thursday that had been significantly chipped several years ago and was supposed to have had removed and replaced at that time but did not. Sxs worsening since onset. Denies notice of fever. Taking OTC pain relievers and using ice pack on face with minimal relief.   Relevant past medical, surgical, family and social history reviewed and updated as indicated. Interim medical history since our last visit reviewed. Allergies and medications reviewed and updated.  Review of Systems  Constitutional: Negative.   HENT: Positive for dental problem.   Respiratory: Negative.   Cardiovascular: Negative.   Gastrointestinal: Negative.   Musculoskeletal: Negative.   Skin: Positive for color change (right jaw).  Neurological: Negative.   Psychiatric/Behavioral: Negative.    Per HPI unless specifically indicated above     Objective:    BP (!) 146/81 (BP Location: Left Arm, Patient Position: Sitting, Cuff Size: Normal)   Pulse 74   Temp 98.6 F (37 C)   Ht  (1.753 m)   Wt 168 lb 9.6 oz (76.5 kg)   SpO2 99%   BMI 24.90 kg/m   Wt Readings from Last 3 Encounters:  02/17/17 168 lb 9.6 oz (76.5 kg)  02/17/17 170 lb (77.1 kg)  11/06/16 171 lb (77.6 kg)    Physical Exam  Constitutional: He is oriented to person, place, and time. He appears well-developed and  well-nourished. No distress.  HENT:  Head: Atraumatic.  No abscess visualized right lower jaw around broken tooth, but red and swollen.  Right cheek and jaw ttp, redness, and swelling  Eyes: Pupils are equal, round, and reactive to light. Conjunctivae are normal.  Neck: Normal range of motion. Neck supple.  Cardiovascular: Normal rate and normal heart sounds.   Pulmonary/Chest: Effort normal and breath sounds normal. No respiratory distress.  Musculoskeletal: Normal range of motion.  Lymphadenopathy:    He has no cervical adenopathy.  Neurological: He is alert and oriented to person, place, and time.  Skin: Skin is warm and dry.  Psychiatric: He has a normal mood and affect. His behavior is normal.  Nursing note and vitals reviewed.     Assessment & Plan:   Problem List Items Addressed This Visit    None    Visit Diagnoses    Dental infection    -  Primary   From broken tooth, start clindamycin, salt water gargles. F/u ASAP with dentist. Return precautions reviewed at length   Relevant Medications   clindamycin (CLEOCIN) 300 MG capsule       Follow up plan: Return for as scheduled.

## 2017-02-17 NOTE — Patient Instructions (Signed)
Follow up as needed

## 2017-02-17 NOTE — Telephone Encounter (Signed)
Patient in office for new patient visit and left before labs could be done. Patient advised and will come tomorrow to complete.

## 2017-03-21 ENCOUNTER — Ambulatory Visit: Payer: BC Managed Care – PPO | Admitting: Rheumatology

## 2017-05-14 ENCOUNTER — Encounter: Payer: Self-pay | Admitting: Family Medicine

## 2017-05-14 ENCOUNTER — Ambulatory Visit: Payer: 59 | Admitting: Family Medicine

## 2017-05-14 VITALS — BP 151/94 | HR 70 | Wt 175.0 lb

## 2017-05-14 DIAGNOSIS — M1A09X0 Idiopathic chronic gout, multiple sites, without tophus (tophi): Secondary | ICD-10-CM | POA: Diagnosis not present

## 2017-05-14 MED ORDER — TRAMADOL HCL 50 MG PO TABS: 50.0000 mg | ORAL_TABLET | Freq: Three times a day (TID) | ORAL | 0 refills | Status: DC | PRN

## 2017-05-14 MED ORDER — PREDNISONE 10 MG PO TABS: ORAL_TABLET | ORAL | 0 refills | Status: DC

## 2017-05-14 NOTE — Progress Notes (Signed)
BP (!) 151/94   Pulse 70   Wt 175 lb (79.4 kg)   SpO2 100%   BMI 25.84 kg/m    Subjective:    Patient ID: Sean Ade., male    DOB: 11-21-1965, 52 y.o.   MRN: [id number]  HPI: Sean Kirkpatrick. is a 52 y.o. male  Chief Complaint  Patient presents with  . Wrist Pain    Gout   Patient presents today with gout flare in left wrist x 2 days. First flare since appt in October with Rheumatology for poorly controlled gout. Currently taking 100 mg allopurinol daily and 2 colchicine daily, tolerating well. Following up with Rheumatology in a few weeks for labs and med review.   Relevant past medical, surgical, family and social history reviewed and updated as indicated. Interim medical history since our last visit reviewed. Allergies and medications reviewed and updated.  Review of Systems  Constitutional: Negative.   HENT: Negative.   Respiratory: Negative.   Cardiovascular: Negative.   Musculoskeletal: Positive for arthralgias and joint swelling.  Skin: Positive for color change.  Neurological: Negative.   Psychiatric/Behavioral: Negative.    Per HPI unless specifically indicated above     Objective:    BP (!) 151/94   Pulse 70   Wt 175 lb (79.4 kg)   SpO2 100%   BMI 25.84 kg/m   Wt Readings from Last 3 Encounters:  05/14/17 175 lb (79.4 kg)  02/17/17 168 lb 9.6 oz (76.5 kg)  02/17/17 170 lb (77.1 kg)    Physical Exam  Constitutional: He is oriented to person, place, and time. He appears well-developed and well-nourished.  HENT:  Head: Atraumatic.  Eyes: Conjunctivae are normal. Pupils are equal, round, and reactive to light. No scleral icterus.  Neck: Normal range of motion. Neck supple.  Cardiovascular: Normal rate, regular rhythm and normal heart sounds.  Pulmonary/Chest: Effort normal and breath sounds normal. No respiratory distress.  Musculoskeletal: Normal range of motion. He exhibits edema (left wrist) and tenderness (significantly ttp over left  wrist).  Lymphadenopathy:    He has no cervical adenopathy.  Neurological: He is alert and oriented to person, place, and time.  Skin: Skin is warm and dry. There is erythema (left wrist, with heat).  Psychiatric: He has a normal mood and affect. His behavior is normal.  Nursing note and vitals reviewed.  Results for orders placed or performed in visit on 11/06/16  Uric acid  Result Value Ref Range   Uric Acid 5.3 3.7 - 8.6 mg/dL  Comprehensive metabolic panel  Result Value Ref Range   Glucose 211 (H) 65 - 99 mg/dL   BUN 14 6 - 24 mg/dL   Creatinine, Ser 7.82 0.76 - 1.27 mg/dL   GFR calc non Af Amer 72 >59 mL/min/1.73   GFR calc Af Amer 84 >59 mL/min/1.73   BUN/Creatinine Ratio 12 9 - 20   Sodium 140 134 - 144 mmol/L   Potassium 4.5 3.5 - 5.2 mmol/L   Chloride 103 96 - 106 mmol/L   CO2 21 20 - 29 mmol/L   Calcium 9.5 8.7 - 10.2 mg/dL   Total Protein 7.0 6.0 - 8.5 g/dL   Albumin 4.2 3.5 - 5.5 g/dL   Globulin, Total 2.8 1.5 - 4.5 g/dL   Albumin/Globulin Ratio 1.5 1.2 - 2.2   Bilirubin Total 0.4 0.0 - 1.2 mg/dL   Alkaline Phosphatase 84 39 - 117 IU/L   AST 18 0 - 40 IU/L  ALT 18 0 - 44 IU/L      Assessment & Plan:   Problem List Items Addressed This Visit      Other   Gout - Primary    Continue current regimen, add prednisone taper and tramadol prn. F/u with Rheumatology for repeat labs, they plan to increase allopurinol if labs satisfactory          Follow up plan: Return for Rheumatology f/u.

## 2017-05-17 NOTE — Assessment & Plan Note (Signed)
Continue current regimen, add prednisone taper and tramadol prn. F/u with Rheumatology for repeat labs, they plan to increase allopurinol if labs satisfactory

## 2017-05-17 NOTE — Patient Instructions (Signed)
Follow up as needed

## 2017-05-20 NOTE — Progress Notes (Deleted)
   Office Visit Note  Patient: Sean AdeLawrence E Bobo Jr.             Date of Birth: 1966-05-08           MRN: [id number]             PCP: Steele Sizerrissman, Mark A, MD Referring: Larene PickettLatta, Cynthia, FNP Visit Date: 05/28/2017 Occupation: @GUAROCC @    Subjective:  No chief complaint on file.   History of Present Illness: Sean AdeLawrence E Vanantwerp Jr. is a 52 y.o. male ***   Activities of Daily Living:  Patient reports morning stiffness for *** {minute/hour:19697}.   Patient {ACTIONS;DENIES/REPORTS:21021675::"Denies"} nocturnal pain.  Difficulty dressing/grooming: {ACTIONS;DENIES/REPORTS:21021675::"Denies"} Difficulty climbing stairs: {ACTIONS;DENIES/REPORTS:21021675::"Denies"} Difficulty getting out of chair: {ACTIONS;DENIES/REPORTS:21021675::"Denies"} Difficulty using hands for taps, buttons, cutlery, and/or writing: {ACTIONS;DENIES/REPORTS:21021675::"Denies"}   No Rheumatology ROS completed.   PMFS History:  Patient Active Problem List   Diagnosis Date Noted  . Acute gout of right elbow 06/13/2015  . Infrapatellar bursitis of left knee 06/13/2015  . Hx of melanoma of skin 01/31/2015  . Acromioclavicular joint pain 01/31/2015  . Weight loss, unintentional 01/31/2015  . Hyperlipidemia   . Hypertension   . Gout   . Dysphagia     Past Medical History:  Diagnosis Date  . Allergic rhinitis   . Dysphagia   . Gout   . History of cellulitis 11/2013 and 8/15  . Hyperlipidemia   . Hypertension     Family History  Problem Relation Age of Onset  . Hypertension Father   . Diabetes Father   . Stroke Mother   . Hypertension Paternal Grandmother   . Myasthenia gravis Unknown        grandfather  . Cancer Maternal Grandmother    Past Surgical History:  Procedure Laterality Date  . HIATAL HERNIA REPAIR     x3  . KNEE ARTHROPLASTY    . KNEE SURGERY     Social History   Social History Narrative  . Not on file     Objective: Vital Signs: There were no vitals taken for this visit.   Physical  Exam   Musculoskeletal Exam: ***  CDAI Exam: No CDAI exam completed.    Investigation: No additional findings.   Imaging: No results found.  Speciality Comments: No specialty comments available.    Procedures:  No procedures performed Allergies: Patient has no known allergies.   Assessment / Plan:     Visit Diagnoses: No diagnosis found.    Orders: No orders of the defined types were placed in this encounter.  No orders of the defined types were placed in this encounter.   Face-to-face time spent with patient was *** minutes. 50% of time was spent in counseling and coordination of care.  Follow-Up Instructions: No Follow-up on file.   Pollyann SavoyShaili Danni Shima, MD  Note - This record has been created using Animal nutritionistDragon software.  Chart creation errors have been sought, but may not always  have been located. Such creation errors do not reflect on  the standard of medical care.

## 2017-05-26 DIAGNOSIS — M7989 Other specified soft tissue disorders: Secondary | ICD-10-CM | POA: Diagnosis not present

## 2017-05-26 DIAGNOSIS — M109 Gout, unspecified: Secondary | ICD-10-CM | POA: Diagnosis not present

## 2017-05-26 DIAGNOSIS — E875 Hyperkalemia: Secondary | ICD-10-CM | POA: Diagnosis not present

## 2017-05-26 DIAGNOSIS — R2232 Localized swelling, mass and lump, left upper limb: Secondary | ICD-10-CM | POA: Diagnosis not present

## 2017-05-26 DIAGNOSIS — R112 Nausea with vomiting, unspecified: Secondary | ICD-10-CM | POA: Diagnosis not present

## 2017-05-26 DIAGNOSIS — M25532 Pain in left wrist: Secondary | ICD-10-CM | POA: Diagnosis not present

## 2017-05-26 DIAGNOSIS — M19032 Primary osteoarthritis, left wrist: Secondary | ICD-10-CM | POA: Diagnosis not present

## 2017-05-26 DIAGNOSIS — M25432 Effusion, left wrist: Secondary | ICD-10-CM | POA: Diagnosis not present

## 2017-05-26 DIAGNOSIS — L539 Erythematous condition, unspecified: Secondary | ICD-10-CM | POA: Diagnosis not present

## 2017-05-26 DIAGNOSIS — L03114 Cellulitis of left upper limb: Secondary | ICD-10-CM | POA: Diagnosis not present

## 2017-05-27 DIAGNOSIS — M109 Gout, unspecified: Secondary | ICD-10-CM | POA: Diagnosis not present

## 2017-05-27 DIAGNOSIS — M25532 Pain in left wrist: Secondary | ICD-10-CM | POA: Diagnosis not present

## 2017-05-27 DIAGNOSIS — M25432 Effusion, left wrist: Secondary | ICD-10-CM | POA: Diagnosis not present

## 2017-05-27 DIAGNOSIS — E875 Hyperkalemia: Secondary | ICD-10-CM | POA: Diagnosis not present

## 2017-05-27 DIAGNOSIS — R2232 Localized swelling, mass and lump, left upper limb: Secondary | ICD-10-CM | POA: Diagnosis not present

## 2017-05-28 ENCOUNTER — Ambulatory Visit: Payer: 59 | Admitting: Rheumatology

## 2017-05-28 DIAGNOSIS — M25532 Pain in left wrist: Secondary | ICD-10-CM | POA: Diagnosis not present

## 2017-05-28 DIAGNOSIS — R2232 Localized swelling, mass and lump, left upper limb: Secondary | ICD-10-CM | POA: Diagnosis not present

## 2017-05-28 DIAGNOSIS — M25521 Pain in right elbow: Secondary | ICD-10-CM | POA: Diagnosis not present

## 2017-05-28 DIAGNOSIS — M109 Gout, unspecified: Secondary | ICD-10-CM | POA: Diagnosis not present

## 2017-05-28 DIAGNOSIS — E875 Hyperkalemia: Secondary | ICD-10-CM | POA: Diagnosis not present

## 2017-05-28 MED ORDER — ALLOPURINOL 300 MG PO TABS
300.00 mg | ORAL_TABLET | ORAL | Status: DC
Start: 2017-05-29 — End: 2017-05-28

## 2017-05-28 MED ORDER — ACETAMINOPHEN 325 MG PO TABS
650.00 mg | ORAL_TABLET | ORAL | Status: DC
Start: ? — End: 2017-05-28

## 2017-05-28 MED ORDER — COLCHICINE 0.6 MG PO TABS
0.60 mg | ORAL_TABLET | ORAL | Status: DC
Start: 2017-05-29 — End: 2017-05-28

## 2017-05-28 MED ORDER — NAPROXEN 250 MG PO TABS
500.00 mg | ORAL_TABLET | ORAL | Status: DC
Start: 2017-05-28 — End: 2017-05-28

## 2017-05-28 MED ORDER — GENERIC EXTERNAL MEDICATION
Status: DC
Start: ? — End: 2017-05-28

## 2017-05-28 MED ORDER — DOXYCYCLINE HYCLATE 100 MG PO TABS
100.00 mg | ORAL_TABLET | ORAL | Status: DC
Start: 2017-05-28 — End: 2017-05-28

## 2017-05-28 MED ORDER — ONDANSETRON 4 MG PO TBDP
4.00 mg | ORAL_TABLET | ORAL | Status: DC
Start: ? — End: 2017-05-28

## 2017-05-28 MED ORDER — ENOXAPARIN SODIUM 40 MG/0.4ML ~~LOC~~ SOLN
40.00 mg | SUBCUTANEOUS | Status: DC
Start: 2017-05-28 — End: 2017-05-28

## 2017-06-04 ENCOUNTER — Ambulatory Visit: Payer: 59 | Admitting: Family Medicine

## 2017-06-04 ENCOUNTER — Encounter: Payer: Self-pay | Admitting: Family Medicine

## 2017-06-04 DIAGNOSIS — L03114 Cellulitis of left upper limb: Secondary | ICD-10-CM

## 2017-06-04 MED ORDER — SULFAMETHOXAZOLE-TRIMETHOPRIM 800-160 MG PO TABS: 1.0000 | ORAL_TABLET | Freq: Two times a day (BID) | ORAL | 0 refills | Status: DC

## 2017-06-04 NOTE — Progress Notes (Signed)
BP (!) 145/87 (BP Location: Left Arm, Patient Position: Sitting, Cuff Size: Normal)   Pulse 64   Temp 98.3 F (36.8 C) (Oral)   Ht 5\' 9"  (1.753 m)   Wt 159 lb 9.6 oz (72.4 kg)   SpO2 99%   BMI 23.57 kg/m    Subjective:    Patient ID: Sean Delacruz., male    DOB: [birth year], 52 y.o.   MRN: [id number]  HPI: Sean Delacruz. is a 52 y.o. male  Chief Complaint  Patient presents with  . Hospitalization Follow-up    Seen 05/26/17 at [facility]  . Joint Swelling  . Wrist Pain  Patient follow-up hospitalization for gout and cellulitis.  Patient was given doxycycline 12 tablets twice a day.  Patient has either 1 or 2 left has been taking it twice a day without problems. Now wrist is little more redness and a little more painful with some decreased range of motion that he had back prior to discharge. Has appointment with orthopedics in 2 days. No further gout symptoms taking allopurinol colchicine and Naprosyn.  Relevant past medical, surgical, family and social history reviewed and updated as indicated. Interim medical history since our last visit reviewed. Allergies and medications reviewed and updated.  Review of Systems  Constitutional: Negative.   Respiratory: Negative.   Cardiovascular: Negative.     Per HPI unless specifically indicated above     Objective:    BP (!) 145/87 (BP Location: Left Arm, Patient Position: Sitting, Cuff Size: Normal)   Pulse 64   Temp 98.3 F (36.8 C) (Oral)   Ht 5\' 9"  (1.753 m)   Wt 159 lb 9.6 oz (72.4 kg)   SpO2 99%   BMI 23.57 kg/m   Wt Readings from Last 3 Encounters:  06/04/17 159 lb 9.6 oz (72.4 kg)  05/14/17 175 lb (79.4 kg)  02/17/17 168 lb 9.6 oz (76.5 kg)    Physical Exam  Constitutional: He is oriented to person, place, and time. He appears well-developed and well-nourished.  HENT:  Head: Normocephalic and atraumatic.  Eyes: Conjunctivae and EOM are normal.  Neck: Normal range of motion.  Cardiovascular: Normal rate,  regular rhythm and normal heart sounds.  Pulmonary/Chest: Effort normal and breath sounds normal.  Musculoskeletal: Normal range of motion.  Neurological: He is alert and oriented to person, place, and time.  Skin: No erythema.  Cellulitis resolving there is some redness and some left hand  Psychiatric: He has a normal mood and affect. His behavior is normal. Judgment and thought content normal.    Results for orders placed or performed in visit on 11/06/16  Uric acid  Result Value Ref Range   Uric Acid 5.3 3.7 - 8.6 mg/dL  Comprehensive metabolic panel  Result Value Ref Range   Glucose 211 (H) 65 - 99 mg/dL   BUN 14 6 - 24 mg/dL   Creatinine, Ser 9.56 0.76 - 1.27 mg/dL   GFR calc non Af Amer 72 >59 mL/min/1.73   GFR calc Af Amer 84 >59 mL/min/1.73   BUN/Creatinine Ratio 12 9 - 20   Sodium 140 134 - 144 mmol/L   Potassium 4.5 3.5 - 5.2 mmol/L   Chloride 103 96 - 106 mmol/L   CO2 21 20 - 29 mmol/L   Calcium 9.5 8.7 - 10.2 mg/dL   Total Protein 7.0 6.0 - 8.5 g/dL   Albumin 4.2 3.5 - 5.5 g/dL   Globulin, Total 2.8 1.5 - 4.5 g/dL   Albumin/Globulin  Ratio 1.5 1.2 - 2.2   Bilirubin Total 0.4 0.0 - 1.2 mg/dL   Alkaline Phosphatase 84 39 - 117 IU/L   AST 18 0 - 40 IU/L   ALT 18 0 - 44 IU/L      Assessment & Plan:   Problem List Items Addressed This Visit      Other   Cellulitis of left hand    Looks like cellulitis is coming back will change to Septra twice a day for 7 days. Also discussed with patient future care and prevention of skin infections with soap and water.          Follow up plan: Return if symptoms worsen or fail to improve, for As scheduled.

## 2017-06-04 NOTE — Assessment & Plan Note (Addendum)
Looks like cellulitis is coming back will change to Septra twice a day for 7 days. Also discussed with patient future care and prevention of skin infections with soap and water.

## 2017-06-13 ENCOUNTER — Telehealth: Payer: Self-pay | Admitting: Family Medicine

## 2017-06-13 NOTE — Telephone Encounter (Signed)
Copied from CRM [phone]#[id number]. Topic: Quick Communication - See Telephone Encounter >> Jun 13, 2017  9:36 AM Floria RavelingStovall, Shana A wrote: CRM for notification. See Telephone encounter for: pt called and stated that he was in 2 weeks ago and his hand is still swollen and cant hold anything in his hand.  He would like to know if he needs to come back in or [facility]     Best number [phone]  06/13/17.

## 2017-06-16 NOTE — Telephone Encounter (Signed)
Phone call Discussed with patient still swollen but not read with no red streaks no fever patient has not been to orthopedics yet will be getting an appointment later this week.  Encourage patient if not going sooner than later to let us know and we will get him in around here.

## 2017-06-16 NOTE — Telephone Encounter (Signed)
Call pt 

## 2017-07-16 ENCOUNTER — Emergency Department
Admission: EM | Admit: 2017-07-16 | Discharge: 2017-07-16 | Disposition: A | Discharge status: Home / Self Care | Payer: 59 | Attending: Emergency Medicine | Admitting: Emergency Medicine

## 2017-07-16 ENCOUNTER — Other Ambulatory Visit: Payer: Self-pay

## 2017-07-16 ENCOUNTER — Encounter: Payer: Self-pay | Admitting: Emergency Medicine

## 2017-07-16 DIAGNOSIS — J111 Influenza due to unidentified influenza virus with other respiratory manifestations: Secondary | ICD-10-CM | POA: Diagnosis not present

## 2017-07-16 DIAGNOSIS — R05 Cough: Secondary | ICD-10-CM | POA: Diagnosis present

## 2017-07-16 DIAGNOSIS — Z87891 Personal history of nicotine dependence: Secondary | ICD-10-CM | POA: Diagnosis not present

## 2017-07-16 DIAGNOSIS — I10 Essential (primary) hypertension: Secondary | ICD-10-CM | POA: Insufficient documentation

## 2017-07-16 DIAGNOSIS — M10061 Idiopathic gout, right knee: Secondary | ICD-10-CM

## 2017-07-16 DIAGNOSIS — Z96659 Presence of unspecified artificial knee joint: Secondary | ICD-10-CM | POA: Insufficient documentation

## 2017-07-16 DIAGNOSIS — Z79899 Other long term (current) drug therapy: Secondary | ICD-10-CM | POA: Insufficient documentation

## 2017-07-16 DIAGNOSIS — R6889 Other general symptoms and signs: Secondary | ICD-10-CM

## 2017-07-16 DIAGNOSIS — M25561 Pain in right knee: Secondary | ICD-10-CM | POA: Diagnosis not present

## 2017-07-16 MED ORDER — COLCHICINE 0.6 MG PO TABS: 0.6000 mg | ORAL_TABLET | Freq: Every day | ORAL | 0 refills | Status: DC

## 2017-07-16 MED ORDER — KETOROLAC TROMETHAMINE 60 MG/2ML IM SOLN
60.0000 mg | Freq: Once | INTRAMUSCULAR | Status: AC
  Administered 2017-07-16: 60 mg via INTRAMUSCULAR
  Filled 2017-07-16: qty 2

## 2017-07-16 MED ORDER — NAPROXEN 500 MG PO TABS: 500.0000 mg | ORAL_TABLET | Freq: Two times a day (BID) | ORAL | Status: DC

## 2017-07-16 MED ORDER — PSEUDOEPH-BROMPHEN-DM 30-2-10 MG/5ML PO SYRP: 5.0000 mL | ORAL_SOLUTION | Freq: Four times a day (QID) | ORAL | 0 refills | Status: DC | PRN

## 2017-07-16 NOTE — ED Triage Notes (Signed)
Body aches, chills, cough since Sunday night, denies fever, states congestion is getting better, gout flared up right knee this morning.

## 2017-07-16 NOTE — ED Provider Notes (Signed)
[facility] Emergency Department Provider Note   ____________________________________________   First MD Initiated Contact with Patient 07/16/17 1423     (approximate)  I have reviewed the triage vital signs and the nursing notes.   HISTORY  Chief Complaint Chills; Cough; Generalized Body Aches; and Gout    HPI Sean AdeLawrence E Brine Jr. is a 52 y.o. male patient complained of body aches, chills, nasal congestion, and cough for 3 days.  Patient denies fever/chills.  Patient denies nausea, vomiting, diarrhea.  Patient also complained of right knee pain which he said is similar to his gout.  Onset of knee pain was this morning.  No palliative measures for complaint.  Patient rates overall pain is 8/10.  Patient describes his pain is "achy".  Past Medical History:  Diagnosis Date  . Allergic rhinitis   . Dysphagia   . Gout   . History of cellulitis 11/2013 and 8/15  . Hyperlipidemia   . Hypertension     Patient Active Problem List   Diagnosis Date Noted  . Cellulitis of left hand 06/04/2017  . Acute gout of right elbow 06/13/2015  . Infrapatellar bursitis of left knee 06/13/2015  . Hx of melanoma of skin 01/31/2015  . Acromioclavicular joint pain 01/31/2015  . Weight loss, unintentional 01/31/2015  . Hyperlipidemia   . Hypertension   . Gout   . Dysphagia     Past Surgical History:  Procedure Laterality Date  . HIATAL HERNIA REPAIR     x3  . KNEE ARTHROPLASTY    . KNEE SURGERY      Prior to Admission medications   Medication Sig Start Date End Date Taking? Authorizing Provider  amLODipine (NORVASC) 10 MG tablet Take 1 tablet (10 mg total) by mouth daily. 03/28/15   Johnson, Megan P, DO  brompheniramine-pseudoephedrine-DM 30-2-10 MG/5ML syrup Take 5 mLs by mouth 4 (four) times daily as needed. 07/16/17   Joni ReiningSmith, Ronald K, PA-C  colchicine 0.6 MG tablet Take 1 tablet (0.6 mg total) by mouth 2 (two) times daily. 02/17/17   Pollyann Savoyeveshwar, Shaili, MD    colchicine 0.6 MG tablet Take 1 tablet (0.6 mg total) by mouth daily. 07/16/17 07/16/18  Joni ReiningSmith, Ronald K, PA-C  Ibuprofen 200 MG CAPS Take by mouth as needed.    [provider]  naproxen (NAPROSYN) 500 MG tablet Take 1 tablet (500 mg total) by mouth 2 (two) times daily with a meal. 07/16/17   Joni ReiningSmith, Ronald K, PA-C  sulfamethoxazole-trimethoprim (BACTRIM DS,SEPTRA DS) 800-160 MG tablet Take 1 tablet by mouth 2 (two) times daily. 06/04/17   Steele Sizerrissman, Mark A, MD    Allergies Patient has no known allergies.  Family History  Problem Relation Age of Onset  . Hypertension Father   . Diabetes Father   . Stroke Mother   . Hypertension Paternal Grandmother   . Myasthenia gravis Unknown        grandfather  . Cancer Maternal Grandmother     Social History Social History   Tobacco Use  . Smoking status: Former Smoker    Packs/day: 1.25    Years: 8.00    Pack years: 10.00    Types: Cigarettes    Last attempt to quit: 05/13/1996    Years since quitting: 21.1  . Smokeless tobacco: Never Used  Substance Use Topics  . Alcohol use: No  . Drug use: No    Review of Systems  Constitutional: No fever/chills Eyes: No visual changes. ENT: No sore throat. Cardiovascular: Denies chest  pain. Respiratory: Denies shortness of breath. Gastrointestinal: No abdominal pain.  No nausea, no vomiting.  No diarrhea.  No constipation. Genitourinary: Negative for dysuria. Musculoskeletal: Negative for back pain. Skin: Negative for rash. Neurological: Negative for headaches, focal weakness or numbness. Endocrine:Gallop, hyperlipidemia, and hypertension.  ____________________________________________   PHYSICAL EXAM:  VITAL SIGNS: ED Triage Vitals  Enc Vitals Group     BP 07/16/17 1207 (!) 156/97     Pulse Rate 07/16/17 1207 74     Resp 07/16/17 1207 18     Temp 07/16/17 1207 98 F (36.7 C)     Temp Source 07/16/17 1207 Oral     SpO2 07/16/17 1207 99 %     Weight 07/16/17 1208 169 lb (76.7  kg)     Height 07/16/17 1208 5\' 9"  (1.753 m)     Head Circumference --      Peak Flow --      Pain Score 07/16/17 1208 8     Pain Loc --      Pain Edu? --      Excl. in GC? --    Constitutional: Alert and oriented. Well appearing and in no acute distress. Nose: Edematous nasal turbinates clear rhinorrhea Mouth/Throat: Mucous membranes are moist.  Oropharynx non-erythematous.  Postnasal drainage. Neck: No stridor.  Hematological/Lymphatic/Immunilogical: No cervical lymphadenopathy. **}Cardiovascular: Normal rate, regular rhythm. Grossly normal heart sounds.  Good peripheral circulation. Respiratory: Normal respiratory effort.  No retractions. Lungs CTAB. Gastrointestinal: Soft and nontender. No distention. No abdominal bruits. No CVA tenderness. Musculoskeletal: No deformity to the right knee.  Mild edema and erythema. Neurologic:  Normal speech and language. No gross focal neurologic deficits are appreciated. No gait instability. Skin:  Skin is warm, dry and intact. No rash noted. Psychiatric: Mood and affect are normal. Speech and behavior are normal.  ____________________________________________   LABS (all labs ordered are listed, but only abnormal results are displayed)  Labs Reviewed - No data to display ____________________________________________  EKG   ____________________________________________  RADIOLOGY  ED MD interpretation:    Official radiology report(s): No results found.  ____________________________________________   PROCEDURES  Procedure(s) performed: None  Procedures  Critical Care performed: No  ____________________________________________   INITIAL IMPRESSION / ASSESSMENT AND PLAN / ED COURSE  As part of my medical decision making, I reviewed the following data within the electronic MEDICAL RECORD NUMBER    Viral respiratory illness and gout to the right knee.  Patient given discharge care instruction.  Patient advised to follow-up with  PCP if no improvement 3-5 days.  Take medication as directed.      ____________________________________________   FINAL CLINICAL IMPRESSION(S) / ED DIAGNOSES  Final diagnoses:  Flu-like symptoms  Acute idiopathic gout of right knee     ED Discharge Orders        Ordered    colchicine 0.6 MG tablet  Daily     07/16/17 1429    naproxen (NAPROSYN) 500 MG tablet  2 times daily with meals     07/16/17 1429    brompheniramine-pseudoephedrine-DM 30-2-10 MG/5ML syrup  4 times daily PRN     07/16/17 1429       Note:  This document was prepared using Dragon voice recognition software and may include unintentional dictation errors.    Joni Reining, PA-C 07/16/17 1435    Emily Filbert, MD 07/16/17 [phone]

## 2017-07-16 NOTE — ED Notes (Signed)
Pt c/o since Sunday body aches, cough, congestion. Denies fever. Denies N&V&D. Alert, oriented, ambulatory. No distress noted.

## 2017-07-23 ENCOUNTER — Encounter: Payer: Self-pay | Admitting: Emergency Medicine

## 2017-07-23 ENCOUNTER — Emergency Department
Admission: EM | Admit: 2017-07-23 | Discharge: 2017-07-23 | Disposition: A | Discharge status: Home / Self Care | Payer: 59 | Attending: Emergency Medicine | Admitting: Emergency Medicine

## 2017-07-23 ENCOUNTER — Other Ambulatory Visit: Payer: Self-pay

## 2017-07-23 ENCOUNTER — Emergency Department: Payer: 59

## 2017-07-23 DIAGNOSIS — Z79899 Other long term (current) drug therapy: Secondary | ICD-10-CM | POA: Diagnosis not present

## 2017-07-23 DIAGNOSIS — Y939 Activity, unspecified: Secondary | ICD-10-CM | POA: Insufficient documentation

## 2017-07-23 DIAGNOSIS — I10 Essential (primary) hypertension: Secondary | ICD-10-CM | POA: Diagnosis not present

## 2017-07-23 DIAGNOSIS — M7041 Prepatellar bursitis, right knee: Secondary | ICD-10-CM | POA: Insufficient documentation

## 2017-07-23 DIAGNOSIS — M25561 Pain in right knee: Secondary | ICD-10-CM | POA: Diagnosis present

## 2017-07-23 DIAGNOSIS — Z87891 Personal history of nicotine dependence: Secondary | ICD-10-CM | POA: Diagnosis not present

## 2017-07-23 DIAGNOSIS — M10061 Idiopathic gout, right knee: Secondary | ICD-10-CM | POA: Diagnosis not present

## 2017-07-23 LAB — CBC WITH DIFFERENTIAL/PLATELET
BASOS ABS: 0 10*3/uL (ref 0–0.1)
Basophils Relative: 0 %
EOS ABS: 0 10*3/uL (ref 0–0.7)
Eosinophils Relative: 0 %
HCT: 42.5 % (ref 40.0–52.0)
HEMOGLOBIN: 14.2 g/dL (ref 13.0–18.0)
LYMPHS ABS: 1.2 10*3/uL (ref 1.0–3.6)
LYMPHS PCT: 10 %
MCH: 28.2 pg (ref 26.0–34.0)
MCHC: 33.5 g/dL (ref 32.0–36.0)
MCV: 84.2 fL (ref 80.0–100.0)
Monocytes Absolute: 1.1 10*3/uL — ABNORMAL HIGH (ref 0.2–1.0)
Monocytes Relative: 9 %
NEUTROS PCT: 81 %
Neutro Abs: 9.7 10*3/uL — ABNORMAL HIGH (ref 1.4–6.5)
Platelets: 350 10*3/uL (ref 150–440)
RBC: 5.04 MIL/uL (ref 4.40–5.90)
RDW: 13.9 % (ref 11.5–14.5)
WBC: 12.1 10*3/uL — AB (ref 3.8–10.6)

## 2017-07-23 LAB — BASIC METABOLIC PANEL WITH GFR
Anion gap: 9 (ref 5–15)
BUN: 16 mg/dL (ref 6–20)
CO2: 25 mmol/L (ref 22–32)
Calcium: 9.5 mg/dL (ref 8.9–10.3)
Chloride: 105 mmol/L (ref 101–111)
Creatinine, Ser: 1.03 mg/dL (ref 0.61–1.24)
GFR calc Af Amer: >60 mL/min
GFR calc non Af Amer: >60 mL/min
Glucose, Bld: 105 mg/dL — ABNORMAL HIGH (ref 65–99)
Potassium: 4.7 mmol/L (ref 3.5–5.1)
Sodium: 139 mmol/L (ref 135–145)

## 2017-07-23 MED ORDER — MELOXICAM 15 MG PO TABS: 15.0000 mg | ORAL_TABLET | Freq: Every day | ORAL | 0 refills | Status: DC

## 2017-07-23 MED ORDER — HYDROCODONE-ACETAMINOPHEN 5-325 MG PO TABS: 1.0000 | ORAL_TABLET | ORAL | 0 refills | Status: DC | PRN

## 2017-07-23 MED ORDER — CLINDAMYCIN HCL 300 MG PO CAPS: 300.0000 mg | ORAL_CAPSULE | Freq: Three times a day (TID) | ORAL | 0 refills | Status: AC

## 2017-07-23 NOTE — ED Triage Notes (Signed)
Pt was seen about 1 week ago and reports told he had gout in right knee and foot. Foot is better but knee still hurting and having some swelling. Reports did not have imaging. Ambulatory to triage.  Unlabored. VSS. No fever

## 2017-07-23 NOTE — ED Provider Notes (Signed)
[facility] Emergency Department Provider Note ____________________________________________  Time seen: Approximately 2:27 PM  I have reviewed the triage vital signs and the nursing notes.   HISTORY  Chief Complaint Gout    HPI Sean Delacruz. is a 52 y.o. male who presents to the emergency department for evaluation and treatment of right knee pain.  He was evaluated here approximately 1 week ago and diagnosed with an acute gout flare.  He states that he has been taking his colchicine as prescribed in the pain in his foot has gotten much better but he still has pain in his right knee.  He denies injury.  He states that the pain has worsened over the past 24 hours and the knee is more red than it was before.  Pain increases significantly with attempt to bear weight.  Past Medical History:  Diagnosis Date  . Allergic rhinitis   . Dysphagia   . Gout   . History of cellulitis 11/2013 and 8/15  . Hyperlipidemia   . Hypertension     Patient Active Problem List   Diagnosis Date Noted  . Cellulitis of left hand 06/04/2017  . Acute gout of right elbow 06/13/2015  . Infrapatellar bursitis of left knee 06/13/2015  . Hx of melanoma of skin 01/31/2015  . Acromioclavicular joint pain 01/31/2015  . Weight loss, unintentional 01/31/2015  . Hyperlipidemia   . Hypertension   . Gout   . Dysphagia     Past Surgical History:  Procedure Laterality Date  . HIATAL HERNIA REPAIR     x3  . KNEE ARTHROPLASTY    . KNEE SURGERY      Prior to Admission medications   Medication Sig Start Date End Date Taking? Authorizing Provider  amLODipine (NORVASC) 10 MG tablet Take 1 tablet (10 mg total) by mouth daily. 03/28/15   Johnson, Megan P, DO  brompheniramine-pseudoephedrine-DM 30-2-10 MG/5ML syrup Take 5 mLs by mouth 4 (four) times daily as needed. 07/16/17   Joni Reining, PA-C  clindamycin (CLEOCIN) 300 MG capsule Take 1 capsule (300 mg total) by mouth 3 (three) times  daily for 10 days. 07/23/17 08/02/17  Ilyssa Grennan, Rulon Eisenmenger B, FNP  colchicine 0.6 MG tablet Take 1 tablet (0.6 mg total) by mouth 2 (two) times daily. 02/17/17   Pollyann Savoy, MD  colchicine 0.6 MG tablet Take 1 tablet (0.6 mg total) by mouth daily. 07/16/17 07/16/18  Joni Reining, PA-C  HYDROcodone-acetaminophen (NORCO/VICODIN) 5-325 MG tablet Take 1 tablet by mouth every 4 (four) hours as needed for moderate pain. 07/23/17 07/23/18  Sadie Hazelett, Rulon Eisenmenger B, FNP  Ibuprofen 200 MG CAPS Take by mouth as needed.    [provider]  meloxicam (MOBIC) 15 MG tablet Take 1 tablet (15 mg total) by mouth daily. 07/23/17   Antionne Enrique, Rulon Eisenmenger B, FNP  naproxen (NAPROSYN) 500 MG tablet Take 1 tablet (500 mg total) by mouth 2 (two) times daily with a meal. 07/16/17   Joni Reining, PA-C    Allergies Patient has no known allergies.  Family History  Problem Relation Age of Onset  . Hypertension Father   . Diabetes Father   . Stroke Mother   . Hypertension Paternal Grandmother   . Myasthenia gravis Unknown        grandfather  . Cancer Maternal Grandmother     Social History Social History   Tobacco Use  . Smoking status: Former Smoker    Packs/day: 1.25    Years: 8.00  Pack years: 10.00    Types: Cigarettes    Last attempt to quit: 05/13/1996    Years since quitting: 21.2  . Smokeless tobacco: Never Used  Substance Use Topics  . Alcohol use: No  . Drug use: No    Review of Systems Constitutional: Negative for fever. Cardiovascular: Negative for chest pain. Respiratory: Negative for shortness of breath. Musculoskeletal: Positive for right knee pain. Skin: Positive for swelling and erythema over the right knee. Neurological: Negative for decrease in sensation  ____________________________________________   PHYSICAL EXAM:  VITAL SIGNS: ED Triage Vitals  Enc Vitals Group     BP 07/23/17 1134 (!) 159/103     Pulse Rate 07/23/17 1134 86     Resp 07/23/17 1134 18     Temp 07/23/17 1134 97.9  F (36.6 C)     Temp Source 07/23/17 1134 Oral     SpO2 07/23/17 1134 99 %     Weight 07/23/17 1135 169 lb (76.7 kg)     Height 07/23/17 1135 5\' 9"  (1.753 m)     Head Circumference --      Peak Flow --      Pain Score 07/23/17 1140 8     Pain Loc --      Pain Edu? --      Excl. in GC? --     Constitutional: Alert and oriented. Well appearing and in no acute distress. Eyes: Conjunctivae are clear without discharge or drainage Head: Atraumatic Neck: Supple Respiratory: No cough. Respirations are even and unlabored. Musculoskeletal: Erythema and edema of the right knee is localized to the prepatellar area. Neurologic: Awake, alert, and oriented x 4. Motor and sensory function intact.  Skin: Erythema localized over the right patella.  Psychiatric: Affect and behavior are appropriate.  ____________________________________________   LABS (all labs ordered are listed, but only abnormal results are displayed)  Labs Reviewed  CBC WITH DIFFERENTIAL/PLATELET - Abnormal; Notable for the following components:      Result Value   WBC 12.1 (*)    Neutro Abs 9.7 (*)    Monocytes Absolute 1.1 (*)    All other components within normal limits  BASIC METABOLIC PANEL - Abnormal; Notable for the following components:   Glucose, Bld 105 (*)    All other components within normal limits   ____________________________________________  RADIOLOGY  IMPRESSION: 1. Prepatellar swelling without soft tissue calcification. No joint effusion or acute osseous findings. 2. Subtle subchondral lucent area at the medial left femoral condyle, question old osteochondral lesion or cyst. ____________________________________________   PROCEDURES  Procedures  ____________________________________________   INITIAL IMPRESSION / ASSESSMENT AND PLAN / ED COURSE  [name] is a 52 y.o. who presents to the emergency department for treatment of prepatellar pain and swelling.  Symptoms and exam are  concerning for an early cellulitis vs acute bursitis and will be treated with clindamycin.  He was instructed to return to the emergency department or see his primary care provider if symptoms are not improving after 2 days of being on antibiotics.  He was instructed to return sooner if symptoms get worse.  Medications - No data to display  Pertinent labs & imaging results that were available during my care of the patient were reviewed by me and considered in my medical decision making (see chart for details).  _________________________________________   FINAL CLINICAL IMPRESSION(S) / ED DIAGNOSES  Final diagnoses:  Prepatellar bursitis of right knee    ED Discharge Orders  Ordered    clindamycin (CLEOCIN) 300 MG capsule  3 times daily     07/23/17 1319    HYDROcodone-acetaminophen (NORCO/VICODIN) 5-325 MG tablet  Every 4 hours PRN     07/23/17 1319    meloxicam (MOBIC) 15 MG tablet  Daily     07/23/17 1319       If controlled substance prescribed during this visit, 12 month history viewed on the [place] prior to issuing an initial prescription for Schedule II or III opiod.    Chinita Pester, FNP 07/23/17 1444    Governor Rooks, MD 07/23/17 [phone]

## 2017-07-25 ENCOUNTER — Emergency Department
Admission: EM | Admit: 2017-07-25 | Discharge: 2017-07-25 | Disposition: A | Discharge status: Home / Self Care | Payer: 59 | Attending: Emergency Medicine | Admitting: Emergency Medicine

## 2017-07-25 ENCOUNTER — Encounter: Payer: Self-pay | Admitting: Emergency Medicine

## 2017-07-25 DIAGNOSIS — Z87891 Personal history of nicotine dependence: Secondary | ICD-10-CM | POA: Insufficient documentation

## 2017-07-25 DIAGNOSIS — M25561 Pain in right knee: Secondary | ICD-10-CM | POA: Diagnosis not present

## 2017-07-25 DIAGNOSIS — Z79899 Other long term (current) drug therapy: Secondary | ICD-10-CM | POA: Diagnosis not present

## 2017-07-25 DIAGNOSIS — M109 Gout, unspecified: Secondary | ICD-10-CM | POA: Insufficient documentation

## 2017-07-25 DIAGNOSIS — I10 Essential (primary) hypertension: Secondary | ICD-10-CM | POA: Diagnosis not present

## 2017-07-25 MED ORDER — PREDNISONE 50 MG PO TABS: ORAL_TABLET | ORAL | 0 refills | Status: DC

## 2017-07-25 MED ORDER — DEXAMETHASONE SODIUM PHOSPHATE 10 MG/ML IJ SOLN
10.0000 mg | Freq: Once | INTRAMUSCULAR | Status: AC
  Administered 2017-07-25: 10 mg via INTRAMUSCULAR
  Filled 2017-07-25: qty 1

## 2017-07-25 NOTE — ED Triage Notes (Signed)
Pt reports pain to right knee. Pt states that he was told a few days ago that if the swelling did not go down he would need to come back to have it drained. Pt states that it has gotten no better. Pain to right knee and states he was told it was bursitis.

## 2017-07-25 NOTE — ED Notes (Signed)
See triage note.presents with pain and swelling to right knee  Was seen for same on  Weds  States pain is not any better

## 2017-07-25 NOTE — ED Provider Notes (Signed)
[facility] Emergency Department Provider Note  ____________________________________________  Time seen: Approximately 3:23 PM  I have reviewed the triage vital signs and the nursing notes.   HISTORY  Chief Complaint Joint Swelling    HPI Sean AdeLawrence E Okonski Jr. is a 52 y.o. male with a history of gout presents to the emergency department with right knee pain.  Patient reports that he was seen on 07/25/2017 with similar symptoms and was placed on clindamycin, meloxicam and Norco.  Patient reports that it has become increasingly painful to bear weight on his right knee.  Patient reports that when he was seen on 07/25/2017, he was concerned as he had fever and flulike symptoms.  Patient reports that fever and flulike symptoms have since resolved but is concerned with no improvement in knee pain.  Patient denies weakness, radiculopathy or changes in sensation of the lower extremities.  No recent falls.  Patient has not tried a course of steroids.   Past Medical History:  Diagnosis Date  . Allergic rhinitis   . Dysphagia   . Gout   . History of cellulitis 11/2013 and 8/15  . Hyperlipidemia   . Hypertension     Patient Active Problem List   Diagnosis Date Noted  . Cellulitis of left hand 06/04/2017  . Acute gout of right elbow 06/13/2015  . Infrapatellar bursitis of left knee 06/13/2015  . Hx of melanoma of skin 01/31/2015  . Acromioclavicular joint pain 01/31/2015  . Weight loss, unintentional 01/31/2015  . Hyperlipidemia   . Hypertension   . Gout   . Dysphagia     Past Surgical History:  Procedure Laterality Date  . HIATAL HERNIA REPAIR     x3  . KNEE ARTHROPLASTY    . KNEE SURGERY      Prior to Admission medications   Medication Sig Start Date End Date Taking? Authorizing Provider  amLODipine (NORVASC) 10 MG tablet Take 1 tablet (10 mg total) by mouth daily. 03/28/15   Johnson, Megan P, DO  brompheniramine-pseudoephedrine-DM 30-2-10 MG/5ML syrup  Take 5 mLs by mouth 4 (four) times daily as needed. 07/16/17   Joni ReiningSmith, Ronald K, PA-C  clindamycin (CLEOCIN) 300 MG capsule Take 1 capsule (300 mg total) by mouth 3 (three) times daily for 10 days. 07/23/17 08/02/17  Triplett, Rulon Eisenmengerari B, FNP  colchicine 0.6 MG tablet Take 1 tablet (0.6 mg total) by mouth 2 (two) times daily. 02/17/17   Pollyann Savoyeveshwar, Shaili, MD  colchicine 0.6 MG tablet Take 1 tablet (0.6 mg total) by mouth daily. 07/16/17 07/16/18  Joni ReiningSmith, Ronald K, PA-C  HYDROcodone-acetaminophen (NORCO/VICODIN) 5-325 MG tablet Take 1 tablet by mouth every 4 (four) hours as needed for moderate pain. 07/23/17 07/23/18  Triplett, Rulon Eisenmengerari B, FNP  Ibuprofen 200 MG CAPS Take by mouth as needed.    [provider]  meloxicam (MOBIC) 15 MG tablet Take 1 tablet (15 mg total) by mouth daily. 07/23/17   Triplett, Rulon Eisenmengerari B, FNP  naproxen (NAPROSYN) 500 MG tablet Take 1 tablet (500 mg total) by mouth 2 (two) times daily with a meal. 07/16/17   Joni ReiningSmith, Ronald K, PA-C  predniSONE (DELTASONE) 50 MG tablet Take one 50 mg tablet once daily for 5 days. 07/25/17   Orvil FeilWoods, Vear Staton M, PA-C    Allergies Patient has no known allergies.  Family History  Problem Relation Age of Onset  . Hypertension Father   . Diabetes Father   . Stroke Mother   . Hypertension Paternal Grandmother   . Myasthenia gravis  Unknown        grandfather  . Cancer Maternal Grandmother     Social History Social History   Tobacco Use  . Smoking status: Former Smoker    Packs/day: 1.25    Years: 8.00    Pack years: 10.00    Types: Cigarettes    Last attempt to quit: 05/13/1996    Years since quitting: 21.2  . Smokeless tobacco: Never Used  Substance Use Topics  . Alcohol use: No  . Drug use: No     Review of Systems  Constitutional: No fever/chills Eyes: No visual changes. No discharge ENT: No upper respiratory complaints. Cardiovascular: no chest pain. Respiratory: no cough. No SOB. Gastrointestinal: No abdominal pain.  No nausea, no  vomiting.  No diarrhea.  No constipation. Musculoskeletal: Patient has right knee pain.  Skin: Negative for rash, abrasions, lacerations, ecchymosis. Neurological: Negative for headaches, focal weakness or numbness.   ____________________________________________   PHYSICAL EXAM:  VITAL SIGNS: ED Triage Vitals  Enc Vitals Group     BP 07/25/17 1323 (!) 158/87     Pulse Rate 07/25/17 1323 66     Resp 07/25/17 1323 18     Temp 07/25/17 1323 98.3 F (36.8 C)     Temp Source 07/25/17 1323 Oral     SpO2 07/25/17 1323 100 %     Weight 07/25/17 1324 151 lb (68.5 kg)     Height 07/25/17 1324 5\' 9"  (1.753 m)     Head Circumference --      Peak Flow --      Pain Score 07/25/17 1336 8     Pain Loc --      Pain Edu? --      Excl. in GC? --      Constitutional: Alert and oriented. Well appearing and in no acute distress. Eyes: Conjunctivae are normal. PERRL. EOMI. Head: Atraumatic. Cardiovascular: Normal rate, regular rhythm. Normal S1 and S2.  Good peripheral circulation. Respiratory: Normal respiratory effort without tachypnea or retractions. Lungs CTAB. Good air entry to the bases with no decreased or absent breath sounds. Musculoskeletal: Patient has pain with active range of motion at the right knee but can perform full range of motion.  Patient's right knee is mildly effused in comparison to the left but peripatellar dimpling is not completely loss.  There is no ballottement.  Negative apprehension.  No erythema.  Palpable dorsalis pedis pulse, right. Neurologic:  Normal speech and language. No gross focal neurologic deficits are appreciated.  Skin:  Skin is warm, dry and intact. No rash noted.   ____________________________________________   LABS (all labs ordered are listed, but only abnormal results are displayed)  Labs Reviewed - No data to display ____________________________________________  EKG   ____________________________________________  RADIOLOGY  No  results found.  ____________________________________________    PROCEDURES  Procedure(s) performed:    Procedures    Medications  dexamethasone (DECADRON) injection 10 mg (not administered)     ____________________________________________   INITIAL IMPRESSION / ASSESSMENT AND PLAN / ED COURSE  Pertinent labs & imaging results that were available during my care of the patient were reviewed by me and considered in my medical decision making (see chart for details).  Review of the [place] CSRS was performed in accordance of the [facility] prior to dispensing any controlled drugs.     Assessment and plan Right knee pain Patient presents to the emergency department with right knee pain that is not improving.  Differential diagnosis included gout flare, bursitis,  arthritis.  Physical exam findings are not consistent with a septic knee at this time.  I offered to conduct an arthrocentesis bedside for patient and he opted to try a course of steroids before considering procedure.  Patient was given strict return precautions to return in 2 days if right knee pain worsens acutely or if knee becomes erythematous and edematous.  Patient voiced understanding.  He was discharged with prednisone and received Decadron in the emergency department.  Vital signs are reassuring prior to discharge.   ____________________________________________  FINAL CLINICAL IMPRESSION(S) / ED DIAGNOSES  Final diagnoses:  Acute pain of right knee      NEW MEDICATIONS STARTED DURING THIS VISIT:  ED Discharge Orders        Ordered    predniSONE (DELTASONE) 50 MG tablet     07/25/17 1521          This chart was dictated using voice recognition software/Dragon. Despite best efforts to proofread, errors can occur which can change the meaning. Any change was purely unintentional.    Orvil Feil, PA-C 07/25/17 1529    Myrna Blazer, MD 07/25/17 2135

## 2017-08-21 ENCOUNTER — Ambulatory Visit: Payer: Self-pay | Admitting: *Deleted

## 2017-08-21 NOTE — Telephone Encounter (Signed)
He called in c/o having a "weird feeling in his tongue".   Denies having problems with eating, talking, or swallowing.   "It's not sore just weird feeling".  See triage notes below.    After triaging him for his tongue he mentioned that 2 weeks ago he had an episode of blurry vision and elevated BP.   Blurry vision:  Started about 2 weeks ago.   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   I just stopped.   My left eye made things look like things were up in the air.  The car I was looking at was "up in the air".      My right eye was blurry.    That happened 2-3 times in a 10 minute period.  I sit down for a few minutes and then was fine.   I had my BP checked at the drug store right after that.   BP 198/105 at the drug store.  My home monitor 200/105.   I just wanted to double check it at the drug store and with my home monitor since my BP was high.  I then laid down for a few minutes and have been fine since then.   No s/s since that time.   I've been a little tired but now I feel fine.   I take a BP medication.   I skipped one dose but been taking it daily.     Yesterday my BP 128/90.  No more symptoms with my eyes being blurry.    No history of eye diseases or problems.  Denied numbness/weakness on one side of his body.  Denied stroke or blood clot history.  Denied any dental procedures.  My tongue being "weird" is what prompted me to call in today.    Reason for Disposition . [1] Sore throat with cough/cold symptoms AND [2] present > 5 days  Additional Information . Commented on: [1] Sore throat is the only symptom AND [2] present > 48 hours    Tongue "feeling weird"  No other symptoms  Answer Assessment - Initial Assessment Questions 1. ONSET: "When did the throat start hurting?" (Hours or days ago)      Tongue feels weird that started a few days ago.    I've not drank anything hot. 2. SEVERITY: "How bad is the sore throat?" (Scale 1-10; mild, moderate or severe)   - MILD  (1-3):  doesn't interfere with eating or normal activities   - MODERATE (4-7): interferes with eating some solids and normal activities   - SEVERE (8-10):  excruciating pain, interferes with most normal activities   - SEVERE DYSPHAGIA: can't swallow liquids, drooling     It's not interfering with eating or drinking.  I have more saliva than normal.  3. STREP EXPOSURE: "Has there been any exposure to strep within the past week?" If so, ask: "What type of contact occurred?"      No sick exposures.   No dental work recently. 4.  VIRAL SYMPTOMS: "Are there any symptoms of a cold, such as a runny nose, cough, hoarse voice or red eyes?"      No  5. FEVER: "Do you have a fever?" If so, ask: "What is your temperature, how was it measured, and when did it start?"     No 6. PUS ON THE TONSILS: "Is there pus on the tonsils in the back of your throat?"     No sore throat 7.  OTHER SYMPTOMS: "Do you have any other symptoms?" (e.g., difficulty breathing, headache, rash)     Little headache on and off.  I use Ibuprofen and it goes away 8. PREGNANCY: "Is there any chance you are pregnant?" "When was your last menstrual period?"     N/A  Protocols used: SORE THROAT-A-AH

## 2017-08-22 ENCOUNTER — Encounter: Payer: Self-pay | Admitting: Family Medicine

## 2017-08-22 ENCOUNTER — Ambulatory Visit: Payer: 59 | Admitting: Family Medicine

## 2017-08-22 VITALS — BP 132/78 | HR 49 | Temp 98.2°F | Wt 156.5 lb

## 2017-08-22 DIAGNOSIS — K146 Glossodynia: Secondary | ICD-10-CM

## 2017-08-22 DIAGNOSIS — I10 Essential (primary) hypertension: Secondary | ICD-10-CM

## 2017-08-22 MED ORDER — MAGIC MOUTHWASH W/LIDOCAINE: 5.0000 mL | Freq: Three times a day (TID) | ORAL | 1 refills | Status: DC | PRN

## 2017-08-22 NOTE — Progress Notes (Signed)
BP 132/78 (BP Location: Right Arm, Patient Position: Sitting, Cuff Size: Normal)   Pulse (!) 49   Temp 98.2 F (36.8 C) (Oral)   Wt 156 lb 8 oz (71 kg)   SpO2 98%   BMI 23.11 kg/m    Subjective:    Patient ID: Sean Ade., male    DOB: 1965-11-28, 52 y.o.   MRN: [id number]  HPI: Sean Delacruz. is a 52 y.o. male  Chief Complaint  Patient presents with  . Hypertension    Patient states that his BP has been running high and then normal. Patient stated Wednesday it was 200/105 and yesterday was 161/110.  . Tongue Pain    Patient states his tongue feels like it's burning. Patient states that these symptoms started after he went to the ED for his knee infection.    Patient here today with concerns about his BP, which has been running 160s-200/100s at times the past few weeks. Seems to normalize fairly quickly once he sits down and relaxes. Consistently taking his amlodipine, not having any HAs, CP, SOB, dizziness.   Also having tongue pain/burning that seemed to start after abx and prednisone from the ER for his knee infection. Denies any lesions appearing or white coating. No fevers, chills, dental pain. Has not tried anything OTC.   Past Medical History:  Diagnosis Date  . Allergic rhinitis   . Dysphagia   . Gout   . History of cellulitis 11/2013 and 8/15  . Hyperlipidemia   . Hypertension    Social History   Socioeconomic History  . Marital status: Married    Spouse name: Not on file  . Number of children: Not on file  . Years of education: Not on file  . Highest education level: Not on file  Occupational History  . Not on file  Social Needs  . Financial resource strain: Not on file  . Food insecurity:    Worry: Not on file    Inability: Not on file  . Transportation needs:    Medical: Not on file    Non-medical: Not on file  Tobacco Use  . Smoking status: Former Smoker    Packs/day: 1.25    Years: 8.00    Pack years: 10.00    Types: Cigarettes      Last attempt to quit: 05/13/1996    Years since quitting: 21.2  . Smokeless tobacco: Never Used  Substance and Sexual Activity  . Alcohol use: No  . Drug use: No  . Sexual activity: Not on file  Lifestyle  . Physical activity:    Days per week: Not on file    Minutes per session: Not on file  . Stress: Not on file  Relationships  . Social connections:    Talks on phone: Not on file    Gets together: Not on file    Attends religious service: Not on file    Active member of club or organization: Not on file    Attends meetings of clubs or organizations: Not on file    Relationship status: Not on file  . Intimate partner violence:    Fear of current or ex partner: Not on file    Emotionally abused: Not on file    Physically abused: Not on file    Forced sexual activity: Not on file  Other Topics Concern  . Not on file  Social History Narrative  . Not on file    Relevant past medical,  surgical, family and social history reviewed and updated as indicated. Interim medical history since our last visit reviewed. Allergies and medications reviewed and updated.  Review of Systems  Per HPI unless specifically indicated above     Objective:    BP 132/78 (BP Location: Right Arm, Patient Position: Sitting, Cuff Size: Normal)   Pulse (!) 49   Temp 98.2 F (36.8 C) (Oral)   Wt 156 lb 8 oz (71 kg)   SpO2 98%   BMI 23.11 kg/m   Wt Readings from Last 3 Encounters:  08/22/17 156 lb 8 oz (71 kg)  07/25/17 151 lb (68.5 kg)  07/23/17 169 lb (76.7 kg)    Physical Exam  Constitutional: He is oriented to person, place, and time. He appears well-developed and well-nourished. No distress.  HENT:  Head: Atraumatic.  Nose: Nose normal.  Tongue and oropharynx erythematous, no cracking, lesions, or coating   Eyes: Pupils are equal, round, and reactive to light. EOM are normal.  Neck: Normal range of motion. Neck supple.  Cardiovascular: Regular rhythm and intact distal pulses.   bradycardic  Pulmonary/Chest: Effort normal and breath sounds normal.  Musculoskeletal: Normal range of motion.  Neurological: He is alert and oriented to person, place, and time.  Skin: Skin is warm and dry.  Psychiatric: He has a normal mood and affect. His behavior is normal.  Nursing note and vitals reviewed.   Results for orders placed or performed during the hospital encounter of 07/23/17  CBC with Differential  Result Value Ref Range   WBC 12.1 (H) 3.8 - 10.6 K/uL   RBC 5.04 4.40 - 5.90 MIL/uL   Hemoglobin 14.2 13.0 - 18.0 g/dL   HCT 16.142.5 09.640.0 - 04.552.0 %   MCV 84.2 80.0 - 100.0 fL   MCH 28.2 26.0 - 34.0 pg   MCHC 33.5 32.0 - 36.0 g/dL   RDW 40.913.9 81.111.5 - 91.414.5 %   Platelets 350 150 - 440 K/uL   Neutrophils Relative % 81 %   Neutro Abs 9.7 (H) 1.4 - 6.5 K/uL   Lymphocytes Relative 10 %   Lymphs Abs 1.2 1.0 - 3.6 K/uL   Monocytes Relative 9 %   Monocytes Absolute 1.1 (H) 0.2 - 1.0 K/uL   Eosinophils Relative 0 %   Eosinophils Absolute 0.0 0 - 0.7 K/uL   Basophils Relative 0 %   Basophils Absolute 0.0 0 - 0.1 K/uL  Basic metabolic panel  Result Value Ref Range   Sodium 139 135 - 145 mmol/L   Potassium 4.7 3.5 - 5.1 mmol/L   Chloride 105 101 - 111 mmol/L   CO2 25 22 - 32 mmol/L   Glucose, Bld 105 (H) 65 - 99 mg/dL   BUN 16 6 - 20 mg/dL   Creatinine, Ser 7.821.03 0.61 - 1.24 mg/dL   Calcium 9.5 8.9 - 95.610.3 mg/dL   GFR calc non Af Amer >60 >60 mL/min   GFR calc Af Amer >60 >60 mL/min   Anion gap 9 5 - 15      Assessment & Plan:   Problem List Items Addressed This Visit      Cardiovascular and Mediastinum   Hypertension - Primary    WNL today, and pt stating the elevations are episodic and often situational. Will continue to monitor on current regimen, and work on stress reduction, increasing exercise, DASH diet. F/u if persistent abnormal readings or HAs, Cp, SOB, etc       Other Visit Diagnoses    Tongue  pain       WIll tx for thrush with magic mouthwash given  recent abx and steroids. Salt water gargles, avoid salty or spicy foods until healed. Push fluids       Follow up plan: Return for CPE.

## 2017-08-25 NOTE — Assessment & Plan Note (Addendum)
WNL today, and pt stating the elevations are episodic and often situational. Will continue to monitor on current regimen, and work on stress reduction, increasing exercise, DASH diet. F/u if persistent abnormal readings or HAs, Cp, SOB, etc

## 2017-08-25 NOTE — Patient Instructions (Signed)
Follow up for CPE 

## 2017-12-24 ENCOUNTER — Other Ambulatory Visit: Payer: Self-pay

## 2017-12-24 ENCOUNTER — Emergency Department
Admission: EM | Admit: 2017-12-24 | Discharge: 2017-12-24 | Disposition: A | Discharge status: Home / Self Care | Payer: 59 | Attending: Emergency Medicine | Admitting: Emergency Medicine

## 2017-12-24 ENCOUNTER — Encounter: Payer: Self-pay | Admitting: *Deleted

## 2017-12-24 DIAGNOSIS — J029 Acute pharyngitis, unspecified: Secondary | ICD-10-CM

## 2017-12-24 DIAGNOSIS — I10 Essential (primary) hypertension: Secondary | ICD-10-CM | POA: Insufficient documentation

## 2017-12-24 DIAGNOSIS — Z79899 Other long term (current) drug therapy: Secondary | ICD-10-CM | POA: Insufficient documentation

## 2017-12-24 DIAGNOSIS — Z87891 Personal history of nicotine dependence: Secondary | ICD-10-CM | POA: Diagnosis not present

## 2017-12-24 DIAGNOSIS — J069 Acute upper respiratory infection, unspecified: Secondary | ICD-10-CM | POA: Diagnosis not present

## 2017-12-24 DIAGNOSIS — E785 Hyperlipidemia, unspecified: Secondary | ICD-10-CM | POA: Diagnosis not present

## 2017-12-24 DIAGNOSIS — R0981 Nasal congestion: Secondary | ICD-10-CM | POA: Diagnosis not present

## 2017-12-24 MED ORDER — LORATADINE-PSEUDOEPHEDRINE ER 5-120 MG PO TB12: 1.0000 | ORAL_TABLET | Freq: Two times a day (BID) | ORAL | 0 refills | Status: DC

## 2017-12-24 NOTE — Discharge Instructions (Addendum)
Please continue with Tylenol and ibuprofen for sore throat pain.  You may use warm salt water gargles.  Make sure you are drinking lots of fluids.  Use Claritin-D as needed for nasal congestion and sinus pain.  If any fevers worsening symptoms or urgent changes in your health please return to the emergency department.

## 2017-12-24 NOTE — ED Triage Notes (Signed)
Pt states he stayed out of work the past three days for his generalized aches and sinus congestion. Pt needs a work note to return to work.

## 2017-12-24 NOTE — ED Provider Notes (Signed)
[facility] EMERGENCY DEPARTMENT Provider Note   CSN: [id number] Arrival date & time: 12/24/17  1708     History   Chief Complaint Chief Complaint  Patient presents with  . Nasal Congestion    HPI Sean AdeLawrence E Bara Jr. is a 52 y.o. male presents to the emergency department for evaluation of sore throat, nasal congestion, sinus pain, body aches.  Symptoms began 2 days ago.  He denies any cough, fevers, rash, vomiting, diarrhea.  No headaches.  Patient states he has taken ibuprofen is seen improvement of his sore throat pain.  He is no longer having body aches.  He is not taking any cough or cold medications.  He is tolerating p.o. Well.  Patient states sore throat pain is a 2 out of 10, sinus pain is a 1 out of 10 mostly along bilateral maxillary sinuses.  No chest pain or shortness of breath.  HPI  Past Medical History:  Diagnosis Date  . Allergic rhinitis   . Dysphagia   . Gout   . History of cellulitis 11/2013 and 8/15  . Hyperlipidemia   . Hypertension     Patient Active Problem List   Diagnosis Date Noted  . Cellulitis of left hand 06/04/2017  . Acute gout of right elbow 06/13/2015  . Infrapatellar bursitis of left knee 06/13/2015  . Hx of melanoma of skin 01/31/2015  . Acromioclavicular joint pain 01/31/2015  . Weight loss, unintentional 01/31/2015  . Hyperlipidemia   . Hypertension   . Gout   . Dysphagia     Past Surgical History:  Procedure Laterality Date  . HIATAL HERNIA REPAIR     x3  . KNEE ARTHROPLASTY    . KNEE SURGERY          Home Medications    Prior to Admission medications   Medication Sig Start Date End Date Taking? Authorizing Provider  amLODipine (NORVASC) 10 MG tablet Take 1 tablet (10 mg total) by mouth daily. 03/28/15   Olevia PerchesJohnson, Megan P, DO  colchicine 0.6 MG tablet Take 1 tablet (0.6 mg total) by mouth daily. 07/16/17 07/16/18  Joni ReiningSmith, Ronald K, PA-C  HYDROcodone-acetaminophen (NORCO/VICODIN) 5-325 MG tablet Take 1  tablet by mouth every 4 (four) hours as needed for moderate pain. 07/23/17 07/23/18  Triplett, Rulon Eisenmengerari B, FNP  Ibuprofen 200 MG CAPS Take by mouth as needed.    [provider]  loratadine-pseudoephedrine (CLARITIN-D 12 HOUR) 5-120 MG tablet Take 1 tablet by mouth 2 (two) times daily. 12/24/17 12/24/18  Evon SlackGaines, Melina Mosteller C, PA-C  magic mouthwash w/lidocaine SOLN Take 5 mLs by mouth 3 (three) times daily as needed for mouth pain. 08/22/17   Particia NearingLane, Rachel Elizabeth, PA-C    Family History Family History  Problem Relation Age of Onset  . Hypertension Father   . Diabetes Father   . Stroke Mother   . Hypertension Paternal Grandmother   . Myasthenia gravis Unknown        grandfather  . Cancer Maternal Grandmother     Social History Social History   Tobacco Use  . Smoking status: Former Smoker    Packs/day: 1.25    Years: 8.00    Pack years: 10.00    Types: Cigarettes    Last attempt to quit: 05/13/1996    Years since quitting: 21.6  . Smokeless tobacco: Never Used  Substance Use Topics  . Alcohol use: No  . Drug use: No     Allergies   Patient has no known allergies.  Review of Systems Review of Systems  Constitutional: Negative for fever.  HENT: Positive for congestion, rhinorrhea, sinus pressure, sinus pain and sore throat.   Respiratory: Negative for cough, wheezing and stridor.   Gastrointestinal: Negative for diarrhea, nausea and vomiting.  Musculoskeletal: Positive for myalgias. Negative for arthralgias and neck stiffness.  Skin: Negative for rash.  Neurological: Negative for dizziness.     Physical Exam Updated Vital Signs BP (!) 164/98 (BP Location: Right Arm)   Pulse 66   Temp 98.4 F (36.9 C) (Oral)   Resp 20   Ht 5\' 9"  (1.753 m)   Wt 72.1 kg   SpO2 99%   BMI 23.48 kg/m   Physical Exam  Constitutional: He is oriented to person, place, and time. He appears well-developed and well-nourished. No distress.  HENT:  Head: Normocephalic and atraumatic.    Right Ear: Hearing, tympanic membrane, external ear and ear canal normal.  Left Ear: Hearing, tympanic membrane, external ear and ear canal normal.  Nose: Rhinorrhea present. No nasal septal hematoma. Right sinus exhibits no maxillary sinus tenderness and no frontal sinus tenderness. Left sinus exhibits no maxillary sinus tenderness and no frontal sinus tenderness.  Mouth/Throat: Oropharynx is clear and moist. No trismus in the jaw. No uvula swelling. No oropharyngeal exudate, posterior oropharyngeal edema, posterior oropharyngeal erythema or tonsillar abscesses.  No frontal or maxillary sinus tenderness palpation.  Uvula is midline.  No tonsillar swelling, erythema or exudates.  Eyes: Conjunctivae are normal.  Neck: Normal range of motion.  Cardiovascular: Normal rate and regular rhythm.  Pulmonary/Chest: No stridor. No respiratory distress. He has no wheezes. He exhibits no tenderness.  Abdominal: Soft. He exhibits no distension. There is no tenderness. There is no guarding.  Musculoskeletal: Normal range of motion.  Lymphadenopathy:    He has no cervical adenopathy.  Neurological: He is alert and oriented to person, place, and time.  Skin: Skin is warm and dry. No rash noted.  Psychiatric: He has a normal mood and affect. His behavior is normal. Judgment and thought content normal.     ED Treatments / Results  Labs (all labs ordered are listed, but only abnormal results are displayed) Labs Reviewed - No data to display  EKG None  Radiology No results found.  Procedures Procedures (including critical care time)  Medications Ordered in ED Medications - No data to display   Initial Impression / Assessment and Plan / ED Course  I have reviewed the triage vital signs and the nursing notes.  Pertinent labs & imaging results that were available during my care of the patient were reviewed by me and considered in my medical decision making (see chart for details).      52 year old male with viral upper respiratory infection.  No signs of bacterial infection.  Patient's vital signs within normal limits, afebrile.  Symptoms have been improving over the last 24 hours.  Patient is given prescription for Claritin-D to take as needed.  He will continue with Tylenol and ibuprofen increase fluids.  He understands signs and symptoms return to ED for.  Final Clinical Impressions(s) / ED Diagnoses   Final diagnoses:  Viral upper respiratory tract infection  Nasal congestion  Viral pharyngitis    ED Discharge Orders         Ordered    loratadine-pseudoephedrine (CLARITIN-D 12 HOUR) 5-120 MG tablet  2 times daily     12/24/17 1801           Evon SlackGaines, Igor Bishop C, [place]PA-C 12/24/17 1807  Sharyn Creamer, MD 12/25/17 0005

## 2018-06-03 ENCOUNTER — Encounter: Payer: Self-pay | Admitting: Family Medicine

## 2018-06-03 ENCOUNTER — Ambulatory Visit: Payer: 59 | Admitting: Family Medicine

## 2018-06-03 VITALS — BP 161/97 | HR 59 | Temp 98.4°F | Ht 69.0 in | Wt 154.0 lb

## 2018-06-03 DIAGNOSIS — M10071 Idiopathic gout, right ankle and foot: Secondary | ICD-10-CM | POA: Diagnosis not present

## 2018-06-03 MED ORDER — COLCHICINE 0.6 MG PO TABS: 0.6000 mg | ORAL_TABLET | Freq: Two times a day (BID) | ORAL | 0 refills | Status: DC | PRN

## 2018-06-03 MED ORDER — PREDNISONE 10 MG PO TABS: ORAL_TABLET | ORAL | 0 refills | Status: DC

## 2018-06-03 NOTE — Progress Notes (Signed)
BP (!) 161/97   Pulse (!) 59   Temp 98.4 F (36.9 C) (Oral)   Ht 5\' 9"  (1.753 m)   Wt 154 lb (69.9 kg)   SpO2 100%   BMI 22.74 kg/m    Subjective:    Patient ID: Sean Ade., male    DOB: 1965-09-07, 53 y.o.   MRN: [id number]  HPI: Sean Well. is a 53 y.o. male  Chief Complaint  Patient presents with  . Gout    Right ankle.    Right ankle pain, redness, and swelling for 2 weeks. Started back on the colcrys twice daily which seems to be helping some. Stopped the allopurinol months ago because he felt it wasn't working well. Taking tart cherry and staying away from alcohol and seafood as they are known triggers for him, has not had a flare for months. Denies fevers, chills, injury.   Relevant past medical, surgical, family and social history reviewed and updated as indicated. Interim medical history since our last visit reviewed. Allergies and medications reviewed and updated.  Review of Systems  Per HPI unless specifically indicated above     Objective:    BP (!) 161/97   Pulse (!) 59   Temp 98.4 F (36.9 C) (Oral)   Ht 5\' 9"  (1.753 m)   Wt 154 lb (69.9 kg)   SpO2 100%   BMI 22.74 kg/m   Wt Readings from Last 3 Encounters:  06/03/18 154 lb (69.9 kg)  12/24/17 159 lb (72.1 kg)  08/22/17 156 lb 8 oz (71 kg)    Physical Exam Vitals signs and nursing note reviewed.  Constitutional:      Appearance: Normal appearance.  HENT:     Head: Atraumatic.  Eyes:     Extraocular Movements: Extraocular movements intact.     Conjunctiva/sclera: Conjunctivae normal.  Neck:     Musculoskeletal: Normal range of motion and neck supple.  Cardiovascular:     Rate and Rhythm: Normal rate and regular rhythm.  Pulmonary:     Effort: Pulmonary effort is normal.     Breath sounds: Normal breath sounds.  Musculoskeletal: Normal range of motion.        General: Swelling (trace edema and erythema/warmth right ankle) and tenderness present.  Skin:    General:  Skin is warm and dry.  Neurological:     General: No focal deficit present.     Mental Status: He is oriented to person, place, and time.  Psychiatric:        Mood and Affect: Mood normal.        Thought Content: Thought content normal.        Judgment: Judgment normal.     Results for orders placed or performed during the hospital encounter of 07/23/17  CBC with Differential  Result Value Ref Range   WBC 12.1 (H) 3.8 - 10.6 K/uL   RBC 5.04 4.40 - 5.90 MIL/uL   Hemoglobin 14.2 13.0 - 18.0 g/dL   HCT 40.7 68.0 - 88.1 %   MCV 84.2 80.0 - 100.0 fL   MCH 28.2 26.0 - 34.0 pg   MCHC 33.5 32.0 - 36.0 g/dL   RDW 10.3 15.9 - 45.8 %   Platelets 350 150 - 440 K/uL   Neutrophils Relative % 81 %   Neutro Abs 9.7 (H) 1.4 - 6.5 K/uL   Lymphocytes Relative 10 %   Lymphs Abs 1.2 1.0 - 3.6 K/uL   Monocytes Relative 9 %  Monocytes Absolute 1.1 (H) 0.2 - 1.0 K/uL   Eosinophils Relative 0 %   Eosinophils Absolute 0.0 0 - 0.7 K/uL   Basophils Relative 0 %   Basophils Absolute 0.0 0 - 0.1 K/uL  Basic metabolic panel  Result Value Ref Range   Sodium 139 135 - 145 mmol/L   Potassium 4.7 3.5 - 5.1 mmol/L   Chloride 105 101 - 111 mmol/L   CO2 25 22 - 32 mmol/L   Glucose, Bld 105 (H) 65 - 99 mg/dL   BUN 16 6 - 20 mg/dL   Creatinine, Ser 9.141.03 0.61 - 1.24 mg/dL   Calcium 9.5 8.9 - 78.210.3 mg/dL   GFR calc non Af Amer >60 >60 mL/min   GFR calc Af Amer >60 >60 mL/min   Anion gap 9 5 - 15      Assessment & Plan:   Problem List Items Addressed This Visit      Other   Gout - Primary    Improving on colchicine. Will start prednisone, refill colchicine, continue avoiding triggers.   Follow up plan: Return in about 4 weeks (around 07/01/2018) for CPE.

## 2018-07-06 ENCOUNTER — Encounter: Payer: 59 | Admitting: Nurse Practitioner

## 2018-09-06 ENCOUNTER — Encounter: Payer: Self-pay | Admitting: Emergency Medicine

## 2018-09-06 ENCOUNTER — Emergency Department: Payer: 59

## 2018-09-06 ENCOUNTER — Other Ambulatory Visit: Payer: Self-pay

## 2018-09-06 ENCOUNTER — Emergency Department
Admission: EM | Admit: 2018-09-06 | Discharge: 2018-09-06 | Disposition: A | Discharge status: Home / Self Care | Payer: 59 | Attending: Emergency Medicine | Admitting: Emergency Medicine

## 2018-09-06 DIAGNOSIS — Z79899 Other long term (current) drug therapy: Secondary | ICD-10-CM | POA: Insufficient documentation

## 2018-09-06 DIAGNOSIS — M25522 Pain in left elbow: Secondary | ICD-10-CM | POA: Diagnosis not present

## 2018-09-06 DIAGNOSIS — L03114 Cellulitis of left upper limb: Secondary | ICD-10-CM

## 2018-09-06 DIAGNOSIS — M7022 Olecranon bursitis, left elbow: Secondary | ICD-10-CM | POA: Insufficient documentation

## 2018-09-06 DIAGNOSIS — I10 Essential (primary) hypertension: Secondary | ICD-10-CM | POA: Diagnosis not present

## 2018-09-06 DIAGNOSIS — Z87891 Personal history of nicotine dependence: Secondary | ICD-10-CM | POA: Diagnosis not present

## 2018-09-06 DIAGNOSIS — Y9389 Activity, other specified: Secondary | ICD-10-CM | POA: Insufficient documentation

## 2018-09-06 DIAGNOSIS — M7989 Other specified soft tissue disorders: Secondary | ICD-10-CM | POA: Diagnosis not present

## 2018-09-06 MED ORDER — OXYCODONE-ACETAMINOPHEN 5-325 MG PO TABS: 1.0000 | ORAL_TABLET | Freq: Four times a day (QID) | ORAL | 0 refills | Status: AC | PRN

## 2018-09-06 MED ORDER — SULFAMETHOXAZOLE-TRIMETHOPRIM 800-160 MG PO TABS: 1.0000 | ORAL_TABLET | Freq: Two times a day (BID) | ORAL | 0 refills | Status: AC

## 2018-09-06 MED ORDER — CEPHALEXIN 500 MG PO CAPS: 500.0000 mg | ORAL_CAPSULE | Freq: Three times a day (TID) | ORAL | 0 refills | Status: DC

## 2018-09-06 MED ORDER — SULFAMETHOXAZOLE-TRIMETHOPRIM 800-160 MG PO TABS
1.0000 | ORAL_TABLET | Freq: Once | ORAL | Status: AC
  Administered 2018-09-06: 1 via ORAL
  Filled 2018-09-06: qty 1

## 2018-09-06 MED ORDER — OXYCODONE-ACETAMINOPHEN 5-325 MG PO TABS: 1.0000 | ORAL_TABLET | Freq: Four times a day (QID) | ORAL | 0 refills | Status: DC | PRN

## 2018-09-06 MED ORDER — CEPHALEXIN 500 MG PO CAPS
500.0000 mg | ORAL_CAPSULE | Freq: Once | ORAL | Status: AC
  Administered 2018-09-06: 500 mg via ORAL
  Filled 2018-09-06: qty 1

## 2018-09-06 MED ORDER — CEPHALEXIN 500 MG PO CAPS: 500.0000 mg | ORAL_CAPSULE | Freq: Three times a day (TID) | ORAL | 0 refills | Status: AC

## 2018-09-06 MED ORDER — SULFAMETHOXAZOLE-TRIMETHOPRIM 800-160 MG PO TABS: 1.0000 | ORAL_TABLET | Freq: Two times a day (BID) | ORAL | 0 refills | Status: DC

## 2018-09-06 NOTE — ED Triage Notes (Signed)
Pt to ED via POV c/o left elbow pain and swelling. 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 Pt is now unable to bend elbow. Pt has redness and swelling in the elbow. Pt is in NAD.

## 2018-09-06 NOTE — ED Provider Notes (Signed)
[facility] Emergency Department Provider Note  ____________________________________________  Time seen: Approximately 5:53 PM  I have reviewed the triage vital signs and the nursing notes.   HISTORY  Chief Complaint Elbow Pain and Joint Swelling    HPI Sean Bonifield. is a 53 y.o. male with a history of gout hypertension hyperlipidemia who complains of left elbow pain and swelling, gradual onset over the last 2 days, worsening.  Denies chest pain shortness of breath fevers or sweats but endorses chills.  Pain is constant, severe, nonradiating, worse with elbow range of motion, no alleviating factors.  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  No immediate or sudden onset of pain.     Past Medical History:  Diagnosis Date  . Allergic rhinitis   . Dysphagia   . Gout   . History of cellulitis 11/2013 and 8/15  . Hyperlipidemia   . Hypertension      Patient Active Problem List   Diagnosis Date Noted  . Cellulitis of left hand 06/04/2017  . Infrapatellar bursitis of left knee 06/13/2015  . Hx of melanoma of skin 01/31/2015  . Hyperlipidemia   . Hypertension   . Gout   . Dysphagia      Past Surgical History:  Procedure Laterality Date  . HIATAL HERNIA REPAIR     x3  . KNEE ARTHROPLASTY    . KNEE SURGERY       Prior to Admission medications   Medication Sig Start Date End Date Taking? Authorizing Provider  amLODipine (NORVASC) 10 MG tablet Take 1 tablet (10 mg total) by mouth daily. 03/28/15   Johnson, Megan P, DO  cephALEXin (KEFLEX) 500 MG capsule Take 1 capsule (500 mg total) by mouth 3 (three) times daily for 10 days. 09/06/18 09/16/18  Sharman Cheek, MD  colchicine 0.6 MG tablet Take 1 tablet (0.6 mg total) by mouth 2 (two) times daily as needed. 06/03/18 06/03/19  Particia Nearing, PA-C  Ibuprofen 200 MG CAPS Take by mouth as needed.    [provider]  oxyCODONE-acetaminophen (PERCOCET) 5-325 MG tablet Take 1 tablet by mouth every 6 (six) hours as needed for up to 3 days for severe pain. 09/06/18 09/09/18  Sharman Cheek, MD  predniSONE (DELTASONE) 10 MG tablet Take 6 tabs day one, 5 tabs day two, 4 tabs day three, etc 06/03/18   Particia Nearing, PA-C  sulfamethoxazole-trimethoprim (BACTRIM DS) 800-160 MG tablet Take 1 tablet by mouth 2 (two) times daily for 10 days. 09/06/18 09/16/18  Sharman Cheek, MD     Allergies Patient has no known allergies.   Family History  Problem Relation Age of Onset  . Hypertension Father   . Diabetes Father   . Stroke Mother   . Hypertension Paternal Grandmother   . Myasthenia gravis Other        grandfather  . Cancer Maternal Grandmother     Social History Social History   Tobacco Use  . Smoking status: Former Smoker    Packs/day: 1.25    Years: 8.00    Pack years: 10.00    Types: Cigarettes    Last attempt to quit: 05/13/1996    Years since quitting: 22.3  . Smokeless tobacco: Never Used  Substance Use Topics  . Alcohol use: No  . Drug use: No    Review of Systems  Constitutional:   No fever positive chills.  ENT:  No sore throat. No rhinorrhea. Cardiovascular:   No chest pain or syncope. Respiratory:   No dyspnea or cough. Gastrointestinal:   Negative for abdominal pain, vomiting and diarrhea.  Musculoskeletal:   Left elbow pain and swelling as above All other systems reviewed and are negative except as documented above in ROS and HPI.  ____________________________________________   PHYSICAL EXAM:  VITAL SIGNS: ED Triage Vitals  Enc Vitals Group     BP 09/06/18 1643 (!) 163/98     Pulse Rate 09/06/18 1643 78     Resp 09/06/18 1643 16     Temp 09/06/18 1644 98.9 F (37.2 C)     Temp Source 09/06/18 1644 Oral     SpO2 09/06/18 1643 100 %     Weight 09/06/18 1643 169 lb (76.7 kg)     Height 09/06/18 1643  (1.753 m)     Head Circumference --       Peak Flow --      Pain Score 09/06/18 1643 10     Pain Loc --      Pain Edu? --      Excl. in [place]? --     Vital signs reviewed, nursing assessments reviewed.   Constitutional:   Alert and oriented. Non-toxic appearance. Eyes:   Conjunctivae are normal. EOMI. ENT      Head:   Normocephalic and atraumatic.    Cardiovascular:   RRR. Symmetric bilateral radial and DP pulses.  No murmurs. Cap refill less than 2 seconds. Respiratory:   Normal respiratory effort without tachypnea/retractions. Breath sounds are clear and equal bilaterally. No wheezes/rales/rhonchi.  Musculoskeletal: Pain limited range of motion of the left elbow.  Passive range of motion is intact and not severely painful.  There is swelling warmth tenderness and erythema over the extensor surface with a mildly enlarged olecranon bursa.  The area of erythema extends far beyond the bursa itself.  No joint effusion in the left elbow.  Joint line is nontender.  No bony point tenderness.  No laceration or other soft tissue injury.  Other extremities unremarkable.  Compartments are soft. Neurologic:   Normal speech and language.  Motor grossly intact. No acute focal neurologic deficits are appreciated.  Skin:    Skin is warm, dry and intact. No rash noted.  No wounds.  ____________________________________________    LABS (pertinent positives/negatives) (all labs ordered are listed, but only abnormal results are displayed) Labs Reviewed - No data to display ____________________________________________   EKG  ____________________________________________    RADIOLOGY  No results found.  ____________________________________________   PROCEDURES Procedures  ____________________________________________  CLINICAL IMPRESSION / ASSESSMENT AND PLAN / ED COURSE  Pertinent labs & imaging results that were available during my care of the patient were reviewed by me and considered in my medical decision making (see chart  for details).  Sean Ade. was evaluated in Emergency Department on 09/06/2018 for the symptoms described in the history of present illness. He was evaluated in the context of the global COVID-19 pandemic, which necessitated consideration that the patient might be at risk for infection with the SARS-CoV-2 virus that causes COVID-19. Institutional protocols and algorithms that pertain to the evaluation of patients at risk for COVID-19 are in a state of rapid change based on information released by regulatory bodies including the CDC and federal and state organizations. These policies and algorithms were followed during the patient's care in the ED.   Patient is nontoxic, presents with left elbow swelling and pain,  exam consistent with bursitis and cellulitis.  Will start Bactrim and Keflex.  He has been taking ibuprofen at home without significant relief so I will prescribe him a limited prescription of Percocet as well.  Doubt fracture or osteomyelitis abscess necrotizing fasciitis or septic arthritis.      ____________________________________________   FINAL CLINICAL IMPRESSION(S) / ED DIAGNOSES    Final diagnoses:  Olecranon bursitis of left elbow  Left arm cellulitis     ED Discharge Orders         Ordered    oxyCODONE-acetaminophen (PERCOCET) 5-325 MG tablet  Every 6 hours PRN,   Status:  Discontinued     09/06/18 1737    cephALEXin (KEFLEX) 500 MG capsule  3 times daily,   Status:  Discontinued     09/06/18 1737    sulfamethoxazole-trimethoprim (BACTRIM DS) 800-160 MG tablet  2 times daily,   Status:  Discontinued     09/06/18 1737    cephALEXin (KEFLEX) 500 MG capsule  3 times daily     09/06/18 1750    oxyCODONE-acetaminophen (PERCOCET) 5-325 MG tablet  Every 6 hours PRN     09/06/18 1750    sulfamethoxazole-trimethoprim (BACTRIM DS) 800-160 MG tablet  2 times daily     09/06/18 1750          Portions of this note were generated with dragon dictation software.  Dictation errors may occur despite best attempts at proofreading.   Sharman CheekStafford, Orlo Brickle, MD 09/06/18 1758

## 2019-01-06 ENCOUNTER — Other Ambulatory Visit: Payer: Self-pay | Admitting: Family Medicine

## 2019-01-06 NOTE — Telephone Encounter (Signed)
Requested medication (s) are due for refill today: no  Requested medication (s) are on the active medication list: no  Last refill:  06/03/2018  Future visit scheduled: no  Notes to clinic:  Per protocol unable to refill   Requested Prescriptions  Pending Prescriptions Disp Refills   MITIGARE 0.6 MG CAPS [Pharmacy Med Name: colchicine 0.6 mg capsule (MITIGARE)] 60 capsule 0    Sig: Take 1 capsule (0.6 mg total) by mouth Two (2) times a day.     Endocrinology:  Gout Agents Failed - 01/06/2019  7:30 AM      Failed - Uric Acid in normal range and within 360 days    Uric Acid  Date Value Ref Range Status  11/06/2016 5.3 3.7 - 8.6 mg/dL Final    Comment:               Therapeutic target for gout patients: <6.0         Failed - Cr in normal range and within 360 days    Creatinine, Ser  Date Value Ref Range Status  07/23/2017 1.03 0.61 - 1.24 mg/dL Final         Passed - Valid encounter within last 12 months    Recent Outpatient Visits          7 months ago Acute idiopathic gout of right ankle   [facility] [place], [place]   1 year ago Essential hypertension   [place], [place], [place]   1 year ago Cellulitis of left hand   [facility] [place], Jeannette How, [place]   1 year ago Idiopathic chronic gout of multiple sites without tophus   [facility] [place], [place]   1 year ago Dental infection   [place], [place], [place]

## 2019-01-06 NOTE — Telephone Encounter (Signed)
Routing to provider  

## 2019-01-07 ENCOUNTER — Other Ambulatory Visit: Payer: Self-pay | Admitting: Family Medicine

## 2019-01-07 NOTE — Telephone Encounter (Signed)
Requested medication (s) are due for refill today: yes  Requested medication (s) are on the active medication list: yes  Last refill:  06/03/18 Future visit scheduled: no  Notes to clinic:  Review for refill   Requested Prescriptions  Pending Prescriptions Disp Refills   MITIGARE 0.6 MG CAPS [Pharmacy Med Name: colchicine 0.6 mg capsule (MITIGARE)] 60 capsule 0    Sig: Take 1 capsule (0.6 mg total) by mouth Two (2) times a day.     Endocrinology:  Gout Agents Failed - 01/07/2019  7:46 AM      Failed - Uric Acid in normal range and within 360 days    Uric Acid  Date Value Ref Range Status  11/06/2016 5.3 3.7 - 8.6 mg/dL Final    Comment:               Therapeutic target for gout patients: <6.0         Failed - Cr in normal range and within 360 days    Creatinine, Ser  Date Value Ref Range Status  07/23/2017 1.03 0.61 - 1.24 mg/dL Final         Passed - Valid encounter within last 12 months    Recent Outpatient Visits          7 months ago Acute idiopathic gout of right ankle   [facility] [place], [place]   1 year ago Essential hypertension   [place], [place], [place]   1 year ago Cellulitis of left hand   [facility] [place], Jeannette How, [place]   1 year ago Idiopathic chronic gout of multiple sites without tophus   [facility] [place], [place]   1 year ago Dental infection   [place], [place], [place]

## 2019-01-07 NOTE — Telephone Encounter (Signed)
Medication Refill - Medication: colchicine 0.6 MG tablet   Has the patient contacted their pharmacy? Yes.   (Agent: If no, request that the patient contact the pharmacy for the refill.) (Agent: If yes, when and what did the pharmacy advise?)  Preferred Pharmacy (with phone number or street name):  [place], [place] [phone] (Phone) [phone] (Fax)     Agent: Please be advised that RX refills may take up to 3 business days. We ask that you follow-up with your pharmacy.

## 2019-06-01 ENCOUNTER — Other Ambulatory Visit: Payer: Self-pay

## 2019-06-01 ENCOUNTER — Emergency Department: Payer: No Typology Code available for payment source

## 2019-06-01 ENCOUNTER — Encounter: Payer: Self-pay | Admitting: Emergency Medicine

## 2019-06-01 ENCOUNTER — Emergency Department
Admission: EM | Admit: 2019-06-01 | Discharge: 2019-06-01 | Disposition: A | Discharge status: Home / Self Care | Payer: No Typology Code available for payment source | Attending: Student | Admitting: Student

## 2019-06-01 DIAGNOSIS — I10 Essential (primary) hypertension: Secondary | ICD-10-CM | POA: Diagnosis not present

## 2019-06-01 DIAGNOSIS — Z87891 Personal history of nicotine dependence: Secondary | ICD-10-CM | POA: Insufficient documentation

## 2019-06-01 DIAGNOSIS — R109 Unspecified abdominal pain: Secondary | ICD-10-CM

## 2019-06-01 DIAGNOSIS — N132 Hydronephrosis with renal and ureteral calculous obstruction: Secondary | ICD-10-CM | POA: Insufficient documentation

## 2019-06-01 DIAGNOSIS — Z79899 Other long term (current) drug therapy: Secondary | ICD-10-CM | POA: Diagnosis not present

## 2019-06-01 DIAGNOSIS — R1032 Left lower quadrant pain: Secondary | ICD-10-CM | POA: Diagnosis not present

## 2019-06-01 DIAGNOSIS — N202 Calculus of kidney with calculus of ureter: Secondary | ICD-10-CM

## 2019-06-01 LAB — BASIC METABOLIC PANEL
Anion gap: 4 — ABNORMAL LOW (ref 5–15)
BUN: 15 mg/dL (ref 6–20)
CO2: 30 mmol/L (ref 22–32)
Calcium: 9.8 mg/dL (ref 8.9–10.3)
Chloride: 107 mmol/L (ref 98–111)
Creatinine, Ser: 1.25 mg/dL — ABNORMAL HIGH (ref 0.61–1.24)
GFR calc Af Amer: >60 mL/min (ref >60)
GFR calc non Af Amer: >60 mL/min (ref >60)
Glucose, Bld: 125 mg/dL — ABNORMAL HIGH (ref 70–99)
Potassium: 3.7 mmol/L (ref 3.5–5.1)
Sodium: 141 mmol/L (ref 135–145)

## 2019-06-01 LAB — URINALYSIS, COMPLETE (UACMP) WITH MICROSCOPIC
Bacteria, UA: NONE SEEN
Bilirubin Urine: NEGATIVE
Glucose, UA: NEGATIVE mg/dL
Ketones, ur: NEGATIVE mg/dL
Leukocytes,Ua: NEGATIVE
Nitrite: NEGATIVE
Protein, ur: NEGATIVE mg/dL
Specific Gravity, Urine: 1.012 (ref 1.005–1.030)
Squamous Epithelial / LPF: NONE SEEN (ref 0–5)
pH: 6 (ref 5.0–8.0)

## 2019-06-01 LAB — CBC
HCT: 43 % (ref 39.0–52.0)
Hemoglobin: 13.9 g/dL (ref 13.0–17.0)
MCH: 28.1 pg (ref 26.0–34.0)
MCHC: 32.3 g/dL (ref 30.0–36.0)
MCV: 87 fL (ref 80.0–100.0)
Platelets: 328 10*3/uL (ref 150–400)
RBC: 4.94 MIL/uL (ref 4.22–5.81)
RDW: 13.4 % (ref 11.5–15.5)
WBC: 10.7 10*3/uL — ABNORMAL HIGH (ref 4.0–10.5)
nRBC: 0 % (ref 0.0–0.2)

## 2019-06-01 MED ORDER — ONDANSETRON 4 MG PO TBDP
4.0000 mg | ORAL_TABLET | Freq: Once | ORAL | Status: AC
  Administered 2019-06-01: 4 mg via ORAL
  Filled 2019-06-01: qty 1

## 2019-06-01 MED ORDER — OXYCODONE HCL 5 MG PO TABS: 5.0000 mg | ORAL_TABLET | Freq: Four times a day (QID) | ORAL | 0 refills | Status: AC | PRN

## 2019-06-01 MED ORDER — ONDANSETRON HCL 4 MG PO TABS: 4.0000 mg | ORAL_TABLET | Freq: Three times a day (TID) | ORAL | 0 refills | Status: AC | PRN

## 2019-06-01 MED ORDER — KETOROLAC TROMETHAMINE 30 MG/ML IJ SOLN
30.0000 mg | Freq: Once | INTRAMUSCULAR | Status: AC
  Administered 2019-06-01: 19:00:00 30 mg via INTRAMUSCULAR
  Filled 2019-06-01: qty 1

## 2019-06-01 NOTE — ED Provider Notes (Signed)
[facility] Emergency Department Provider Note  ____________________________________________   First MD Initiated Contact with Patient 06/01/19 1708     (approximate)  I have reviewed the triage vital signs and the nursing notes.  History  Chief Complaint Flank Pain    HPI Sean Delacruz. is a 54 y.o. male with history of gout, GERD, remote history of nephrolithiasis, who presents to the emergency department for intermittent left-sided flank pain.  Patient states symptoms first started on Sunday.  They are intermittent, colicky in nature.  Reports a sharp pain, located to the left flank, 6/10 in severity, radiates down towards his groin area.  No aggravating components.  Comes and goes without any identifiable triggers.  Moderately improved with rest/relaxation.  No fevers.  Has had some associated nausea and vomiting.  No noticeable hematuria, dysuria, malodorous urine.   Past Medical Hx Past Medical History:  Diagnosis Date  . Allergic rhinitis   . Dysphagia   . Gout   . History of cellulitis 11/2013 and 8/15  . Hyperlipidemia   . Hypertension     Problem List Patient Active Problem List   Diagnosis Date Noted  . Cellulitis of left hand 06/04/2017  . Infrapatellar bursitis of left knee 06/13/2015  . Hx of melanoma of skin 01/31/2015  . Hyperlipidemia   . Hypertension   . Gout   . Dysphagia     Past Surgical Hx Past Surgical History:  Procedure Laterality Date  . HIATAL HERNIA REPAIR     x3  . KNEE ARTHROPLASTY    . KNEE SURGERY      Medications Prior to Admission medications   Medication Sig Start Date End Date Taking? Authorizing Provider  amLODipine (NORVASC) 10 MG tablet Take 1 tablet (10 mg total) by mouth daily. 03/28/15   Johnson, Megan P, DO  colchicine 0.6 MG tablet Take 1 tablet (0.6 mg total) by mouth 2 (two) times daily as needed. 06/03/18 06/03/19  Particia Nearing, PA-C  Ibuprofen 200 MG CAPS Take by mouth as  needed.    [provider]  MITIGARE 0.6 MG CAPS Take 1 capsule (0.6 mg total) by mouth Two (2) times a day. 01/07/19   Particia Nearing, PA-C  MITIGARE 0.6 MG CAPS Take 1 capsule (0.6 mg total) by mouth Two (2) times a day. 01/07/19   Steele Sizer, MD  predniSONE (DELTASONE) 10 MG tablet Take 6 tabs day one, 5 tabs day two, 4 tabs day three, etc 06/03/18   Particia Nearing, PA-C    Allergies Patient has no known allergies.  Family Hx Family History  Problem Relation Age of Onset  . Hypertension Father   . Diabetes Father   . Stroke Mother   . Hypertension Paternal Grandmother   . Myasthenia gravis Other        grandfather  . Cancer Maternal Grandmother     Social Hx Social History   Tobacco Use  . Smoking status: Former Smoker    Packs/day: 1.25    Years: 8.00    Pack years: 10.00    Types: Cigarettes    Quit date: 05/13/1996    Years since quitting: 23.0  . Smokeless tobacco: Never Used  Substance Use Topics  . Alcohol use: No  . Drug use: No     Review of Systems  Constitutional: Negative for fever, chills. Eyes: Negative for visual changes. ENT: Negative for sore throat. Cardiovascular: Negative for chest pain. Respiratory: Negative for shortness of  breath. Gastrointestinal: + for nausea, vomiting, left flank pain.  Genitourinary: Negative for dysuria. Musculoskeletal: Negative for leg swelling. Skin: Negative for rash. Neurological: Negative for for headaches.   Physical Exam  Vital Signs: ED Triage Vitals [06/01/19 1647]  Enc Vitals Group     BP (!) 163/100     Pulse Rate 71     Resp 16     Temp 98.3 F (36.8 C)     Temp Source Oral     SpO2 99 %     Weight 167 lb (75.8 kg)     Height 5\' 9"  (1.753 m)     Head Circumference      Peak Flow      Pain Score 6     Pain Loc      Pain Edu?      Excl. in [place]?     Constitutional: Alert and oriented.  Head: Normocephalic. Atraumatic. Eyes: Conjunctivae clear. Sclera  anicteric. Nose: No congestion. No rhinorrhea. Mouth/Throat: Wearing mask.  Neck: No stridor.   Cardiovascular: Normal rate, regular rhythm. Extremities well perfused. Respiratory: Normal respiratory effort.  Lungs CTAB. Gastrointestinal: Soft.  Mild tenderness to the left flank, LLQ.  No rebound or guarding.  Non-distended.  Musculoskeletal: No lower extremity edema. No deformities. Neurologic:  Normal speech and language. No gross focal neurologic deficits are appreciated.  Skin: Skin is warm, dry and intact. No rash noted. Psychiatric: Mood and affect are appropriate for situation.   Radiology  CT: IMPRESSION:  1. Mild left hydronephrosis and hydroureter, secondary to a 6 mm  stone in the proximal to mid left ureter at approximate L4 level.  2. Small intrarenal stones bilaterally  3. Sigmoid colon diverticula without acute inflammatory change    Procedures  Procedure(s) performed (including critical care):  Procedures   Initial Impression / Assessment and Plan / ED Course  54 y.o. male who presents to the ED for intermittent, colicky left-sided flank pain, suggestive of nephrolithiasis.   CT imaging reveals left-sided ureterolithiasis with associated hydronephrosis.  Sigmoid diverticula without acute inflammatory changes. UA without evidence of concomitant infection.  As such, will plan for discharge with outpatient urology follow-up.  Rx for oxycodone and Zofran as needed for symptomatic control.  Patient comfortable with the plan.  Given return precautions.   Final Clinical Impression(s) / ED Diagnosis  Final diagnoses:  Flank pain  Nephroureterolithiasis       Note:  This document was prepared using Dragon voice recognition software and may include unintentional dictation errors.   Lilia Pro., MD 06/01/19 1946

## 2019-06-01 NOTE — ED Triage Notes (Signed)
FIRST NURSE NOTE- pt ambulatory for abdominal pain. NAD

## 2019-06-01 NOTE — Discharge Instructions (Signed)
Thank you for letting us take care of you in the emergency department today. Your work up has shown that you have a kidney stone.   Please continue to take any regular, prescribed medications.   New medications we have prescribed:  - Oxycodone - pain medication for pain you cannot control first with ibuprofen of Tylenol. Do not take and drive/operate heavy machinery/drink alcohol. - Zofran - for nausea, as needed  Please follow up with: - Urology doctor - to follow up on your kidney stone. Information for one is below.   Please return to the ER for any new or worsening symptoms.

## 2019-06-01 NOTE — ED Triage Notes (Signed)
Patient reports left flank pain that started yesterday. Reports he has history of kidney stones and thinks that is what this is. Patient reports pain subsided last night, however started again this morning, accompanied by N/V.

## 2019-06-08 ENCOUNTER — Telehealth: Payer: Self-pay | Admitting: Urology

## 2019-06-08 NOTE — Telephone Encounter (Signed)
Spoke with patient and he declined litho, said he was not in pain and he thinks he passed the stone. Stated he would call back if he wanted to be seen.    Sean Delacruz

## 2019-06-08 NOTE — Telephone Encounter (Signed)
-----   Message from Riki Altes, MD sent at 06/08/2019  2:03 PM EST ----- Regarding: ESWL Patient was seen in the ED last week with a 6 mm ureteral calculus.  Can work him in to see me tomorrow morning if he is interested in lithotripsy on Thursday.  If he has not passed a stone but is not having pain and does not want lithotripsy would recommend a follow-up appointment in the next few weeks unless he is already seeing another urologist.  Thanks

## 2019-12-03 ENCOUNTER — Encounter: Payer: Self-pay | Admitting: Family Medicine

## 2019-12-03 ENCOUNTER — Other Ambulatory Visit: Payer: Self-pay

## 2019-12-03 ENCOUNTER — Ambulatory Visit (INDEPENDENT_AMBULATORY_CARE_PROVIDER_SITE_OTHER): Payer: Self-pay | Admitting: Family Medicine

## 2019-12-03 VITALS — BP 125/83 | HR 79 | Temp 98.8°F | Wt 153.0 lb

## 2019-12-03 DIAGNOSIS — M1A09X0 Idiopathic chronic gout, multiple sites, without tophus (tophi): Secondary | ICD-10-CM

## 2019-12-03 MED ORDER — COLCHICINE 0.6 MG PO TABS: 0.6000 mg | ORAL_TABLET | Freq: Two times a day (BID) | ORAL | 0 refills | Status: DC | PRN

## 2019-12-03 MED ORDER — SULFAMETHOXAZOLE-TRIMETHOPRIM 800-160 MG PO TABS: 1.0000 | ORAL_TABLET | Freq: Two times a day (BID) | ORAL | 0 refills | Status: DC

## 2019-12-03 NOTE — Assessment & Plan Note (Signed)
Right thumb issue consistent with gout, left knee either significant gout flare or infectious prepatellar bursitis. Tx with bactrim, colcrys BID until resolved, ibuprofen prn. Supportive home care and strict return precautions reviewed with patient. If becoming febrile, worsening swelling or redness, pain, etc go to ER right away

## 2019-12-03 NOTE — Progress Notes (Signed)
BP 125/83   Pulse 79   Temp 98.8 F (37.1 C) (Oral)   Wt 153 lb (69.4 kg)   SpO2 94%   BMI 22.59 kg/m    Subjective:    Patient ID: Sean Ade., male    DOB: Feb 19, 1966, 54 y.o.   MRN: [id number]  HPI: Sean Teuscher. is a 54 y.o. male  Chief Complaint  Patient presents with  . Gout    right thumb and left knee x a week   Here today for right thumb pain and left knee pain that's been present and worsening x 1 week. First gout flare in over a year now. Left knee is severely painful and now having red streaking down lower left leg. Denies fever, chills, sweats, injury to knee or thumb. Long hx of gout, was previously on allopurinol but 2 years ago stopped because he didn't feel it was effective. Had been taking colchicine prn but ran out. Not currently taking anything for sxs has just been managing with diet changes.   Relevant past medical, surgical, family and social history reviewed and updated as indicated. Interim medical history since our last visit reviewed. Allergies and medications reviewed and updated.  Review of Systems  Per HPI unless specifically indicated above     Objective:    BP 125/83   Pulse 79   Temp 98.8 F (37.1 C) (Oral)   Wt 153 lb (69.4 kg)   SpO2 94%   BMI 22.59 kg/m   Wt Readings from Last 3 Encounters:  12/03/19 153 lb (69.4 kg)  06/01/19 167 lb (75.8 kg)  09/06/18 169 lb (76.7 kg)    Physical Exam Vitals and nursing note reviewed.  Constitutional:      Appearance: Normal appearance.  HENT:     Head: Atraumatic.  Eyes:     Extraocular Movements: Extraocular movements intact.     Conjunctiva/sclera: Conjunctivae normal.  Cardiovascular:     Rate and Rhythm: Normal rate and regular rhythm.  Pulmonary:     Effort: Pulmonary effort is normal.     Breath sounds: Normal breath sounds.  Musculoskeletal:        General: Swelling (left knee with prepatellar bursa erythematous, edematous and significant ttp with spreading  erythema spreading to rest of knee joint and streaking down left lower leg) and tenderness present.     Cervical back: Normal range of motion and neck supple.     Comments: Antalgic ROM   Skin:    General: Skin is warm and dry.     Findings: Erythema (also area of erythema and edema right thumb ) present.  Neurological:     General: No focal deficit present.     Mental Status: He is oriented to person, place, and time.  Psychiatric:        Mood and Affect: Mood normal.        Thought Content: Thought content normal.        Judgment: Judgment normal.     Results for orders placed or performed during the hospital encounter of 06/01/19  Urinalysis, Complete w Microscopic  Result Value Ref Range   Color, Urine YELLOW (A) YELLOW   APPearance CLEAR (A) CLEAR   Specific Gravity, Urine 1.012 1.005 - 1.030   pH 6.0 5.0 - 8.0   Glucose, UA NEGATIVE NEGATIVE mg/dL   Hgb urine dipstick SMALL (A) NEGATIVE   Bilirubin Urine NEGATIVE NEGATIVE   Ketones, ur NEGATIVE NEGATIVE mg/dL   Protein, ur  NEGATIVE NEGATIVE mg/dL   Nitrite NEGATIVE NEGATIVE   Leukocytes,Ua NEGATIVE NEGATIVE   RBC / HPF 21-50 0 - 5 RBC/hpf   WBC, UA 0-5 0 - 5 WBC/hpf   Bacteria, UA NONE SEEN NONE SEEN   Squamous Epithelial / LPF NONE SEEN 0 - 5   Mucus PRESENT   CBC  Result Value Ref Range   WBC 10.7 (H) 4.0 - 10.5 K/uL   RBC 4.94 4.22 - 5.81 MIL/uL   Hemoglobin 13.9 13.0 - 17.0 g/dL   HCT 81.1 39 - 52 %   MCV 87.0 80.0 - 100.0 fL   MCH 28.1 26.0 - 34.0 pg   MCHC 32.3 30.0 - 36.0 g/dL   RDW 91.4 78.2 - 95.6 %   Platelets 328 150 - 400 K/uL   nRBC 0.0 0.0 - 0.2 %  Basic metabolic panel  Result Value Ref Range   Sodium 141 135 - 145 mmol/L   Potassium 3.7 3.5 - 5.1 mmol/L   Chloride 107 98 - 111 mmol/L   CO2 30 22 - 32 mmol/L   Glucose, Bld 125 (H) 70 - 99 mg/dL   BUN 15 6 - 20 mg/dL   Creatinine, Ser 2.13 (H) 0.61 - 1.24 mg/dL   Calcium 9.8 8.9 - 08.6 mg/dL   GFR calc non Af Amer >60 >60 mL/min   GFR  calc Af Amer >60 >60 mL/min   Anion gap 4 (L) 5 - 15      Assessment & Plan:   Problem List Items Addressed This Visit      Other   Gout - Primary    Right thumb issue consistent with gout, left knee either significant gout flare or infectious prepatellar bursitis. Tx with bactrim, colcrys BID until resolved, ibuprofen prn. Supportive home care and strict return precautions reviewed with patient. If becoming febrile, worsening swelling or redness, pain, etc go to ER right away          Follow up plan: Return in about 1 week (around 12/10/2019) for knee pain.

## 2019-12-06 ENCOUNTER — Telehealth: Payer: Self-pay | Admitting: Family Medicine

## 2019-12-06 NOTE — Telephone Encounter (Signed)
Patient notified and verbalized understanding. 

## 2019-12-06 NOTE — Telephone Encounter (Signed)
Copied from CRM [id number]. Topic: General - Call Back - No Documentation >> Dec 06, 2019 10:43 AM Randol Kern wrote: Pt called to report that his redness and pain has decreased however swelling is still prominent. Please advise, pt declined speaking to a nurse. States that he was instructed per last Friday's visit to call and leave a message with his update.  Best contact: [phone]

## 2019-12-06 NOTE — Telephone Encounter (Signed)
Glad to hear there is improvement, complete antibiotics, use compression, elevation, ice and let us know if becoming worse and not better throughout this week

## 2019-12-10 ENCOUNTER — Ambulatory Visit: Payer: Self-pay | Admitting: Nurse Practitioner

## 2020-01-12 ENCOUNTER — Other Ambulatory Visit: Payer: Self-pay

## 2020-01-13 ENCOUNTER — Ambulatory Visit: Payer: Self-pay

## 2020-01-13 NOTE — Telephone Encounter (Signed)
Patient called and says he's waiting on his COVID test results from yesterday, he was exposed to his daughter who lives with them, she tested positive on Sunday. He says on Sunday he became hoarse. He says he had a headache x 2 days, no headache today, he had a fever yesterday 100.7, no fever today. He says he lost his taste and smell yesterday, feels a little tired, has a cough. He says the cough is not bad, but at times he constantly coughs. He says his throat is irritated and it makes him cough. I advised he will need a virtual appointment, appointment scheduled for tomorrow, 01/14/20 at 0940 with Aura Dials, FNP, care advice given, he verbalized understanding.   Reason for Disposition . [1] COVID-19 infection suspected by caller or triager AND [2] mild symptoms (cough, fever, or others) AND [3] no complications or SOB  Answer Assessment - Initial Assessment Questions 1. COVID-19 DIAGNOSIS: "Who made your Coronavirus (COVID-19) diagnosis?" "Was it confirmed by a positive lab test?" If not diagnosed by a HCP, ask "Are there lots of cases (community spread) where you live?" (See public health department website, if unsure)     Waiting on results, tested yesterday 2. COVID-19 EXPOSURE: "Was there any known exposure to COVID before the symptoms began?" CDC Definition of close contact: within 6 feet (2 meters) for a total of 15 minutes or more over a 24-hour period.      Yes, my daughter who lives with me tested positive on Sunday at [facility]-she has symptoms 3. ONSET: "When did the COVID-19 symptoms start?"      Sunday started getting hoarse 4. WORST SYMPTOM: "What is your worst symptom?" (e.g., cough, fever, shortness of breath, muscle aches)     Cough 5. COUGH: "Do you have a cough?" If Yes, ask: "How bad is the cough?"       Yes, can go hours without, then it starts 6. FEVER: "Do you have a fever?" If Yes, ask: "What is your temperature, how was it measured, and when did it start?"     Not today,  100.7 yesterday took Tylenol 7. RESPIRATORY STATUS: "Describe your breathing?" (e.g., shortness of breath, wheezing, unable to speak)       Breathing is fine 8. BETTER-SAME-WORSE: "Are you getting better, staying the same or getting worse compared to yesterday?"  If getting worse, ask, "In what way?"      Feel ok right now, but the cough is driving me crazy 9. HIGH RISK DISEASE: "Do you have any chronic medical problems?" (e.g., asthma, heart or lung disease, weak immune system, obesity, etc.)     No 10. PREGNANCY: "Is there any chance you are pregnant?" "When was your last menstrual period?"       N/A 11. OTHER SYMPTOMS: "Do you have any other symptoms?"  (e.g., chills, fatigue, headache, loss of smell or taste, muscle pain, sore throat; new loss of smell or taste especially support the diagnosis of COVID-19)       Fatigue, loss of taste, loss of smell, headache x 2 days not today, hoarse  Protocols used: CORONAVIRUS (COVID-19) DIAGNOSED OR SUSPECTED-A-AH

## 2020-01-13 NOTE — Telephone Encounter (Signed)
This encounter was created in error - please disregard.

## 2020-01-14 ENCOUNTER — Ambulatory Visit (INDEPENDENT_AMBULATORY_CARE_PROVIDER_SITE_OTHER): Payer: No Typology Code available for payment source | Admitting: Nurse Practitioner

## 2020-01-14 ENCOUNTER — Encounter: Payer: Self-pay | Admitting: Nurse Practitioner

## 2020-01-14 VITALS — Temp 100.1°F

## 2020-01-14 DIAGNOSIS — Z20822 Contact with and (suspected) exposure to covid-19: Secondary | ICD-10-CM

## 2020-01-14 MED ORDER — HYDROCOD POLST-CPM POLST ER 10-8 MG/5ML PO SUER: 5.0000 mL | Freq: Two times a day (BID) | ORAL | 0 refills | Status: DC | PRN

## 2020-01-14 MED ORDER — ALBUTEROL SULFATE HFA 108 (90 BASE) MCG/ACT IN AERS: 2.0000 | INHALATION_SPRAY | Freq: Four times a day (QID) | RESPIRATORY_TRACT | 0 refills | Status: DC | PRN

## 2020-01-14 NOTE — Progress Notes (Signed)
Temp 100.1 F (37.8 C) (Oral)    Subjective:    Patient ID: Sean Ade., male    DOB: [birth year], 54 y.o.   MRN: [id number]  HPI: Sean Delacruz. is a 54 y.o. male  Chief Complaint  Patient presents with   Covid Exposure    pt states his family at home has covid. states he will be tested today   Cough    symptoms started this past Tuesday night. has tried OTC tylenol and some left over Rx prednisone   Headache   Generalized Body Aches   Fever     This visit was completed via telephone due to the restrictions of the COVID-19 pandemic. All issues as above were discussed and addressed but no physical exam was performed. If it was felt that the patient should be evaluated in the office, they were directed there. The patient verbally consented to this visit. Patient was unable to complete an audio/visual visit due to Lack of equipment. Due to the catastrophic nature of the COVID-19 pandemic, this visit was done through audio contact only.  Location of the patient: home  Location of the provider: work  Those involved with this call:   Provider: Aura Dials, DNP  CMA: Wilhemena Durie, CMA  Front Desk/Registration: Adela Ports   Time spent on call: 20 minutes on the phone discussing health concerns. 15 minutes total spent in review of patient's record and preparation of their chart.   I verified patient identity using two factors (patient name and date of birth). Patient consents verbally to being seen via telemedicine visit today.    COVID EXPOSURE Has an exposure to Covid via daughter on Saturday 01/08/20.  He started with symptoms on 01/11/20, fever.  Not currently vaccinated.  Reports he took some leftover Prednisone Wednesday and Tylenol.  Does endorse loss of taste and smell.  Is getting tested today in [place] at 1800.   Fever: yes Cough: yes Shortness of breath: no Wheezing: no Chest pain: no Chest tightness: no Chest congestion:  no Nasal congestion: no Runny nose: no Post nasal drip: no Sneezing: no Sore throat: yes Swollen glands: no Sinus pressure: yes Headache: yes Face pain: no Toothache: no Ear pain: none Ear pressure: none Eyes red/itching:no Eye drainage/crusting: no  Vomiting: no Rash: no Fatigue: yes Sick contacts: yes Strep contacts: no  Context: fluctuating Recurrent sinusitis: no Relief with OTC cold/cough medications: yes  Treatments attempted: Tylenol and OTC medications  Relevant past medical, surgical, family and social history reviewed and updated as indicated. Interim medical history since our last visit reviewed. Allergies and medications reviewed and updated.  Review of Systems  Constitutional: Positive for fatigue and fever. Negative for activity change and diaphoresis.  HENT: Positive for sore throat. Negative for congestion, ear discharge, ear pain, postnasal drip, rhinorrhea, sinus pressure and sinus pain.   Respiratory: Positive for cough. Negative for chest tightness, shortness of breath and wheezing.   Cardiovascular: Negative for chest pain, palpitations and leg swelling.  Gastrointestinal: Negative.   Endocrine: Negative for cold intolerance, heat intolerance, polydipsia, polyphagia and polyuria.  Musculoskeletal: Negative for myalgias.  Neurological: Positive for headaches. Negative for dizziness, syncope and weakness.  Psychiatric/Behavioral: Negative.     Per HPI unless specifically indicated above     Objective:    Temp 100.1 F (37.8 C) (Oral)   Wt Readings from Last 3 Encounters:  12/03/19 153 lb (69.4 kg)  06/01/19 167 lb (75.8 kg)  09/06/18 169 lb (  76.7 kg)    Physical Exam   Unable to perform due to telephone visit only.  Results for orders placed or performed during the hospital encounter of 06/01/19  Urinalysis, Complete w Microscopic  Result Value Ref Range   Color, Urine YELLOW (A) YELLOW   APPearance CLEAR (A) CLEAR   Specific Gravity,  Urine 1.012 1.005 - 1.030   pH 6.0 5.0 - 8.0   Glucose, UA NEGATIVE NEGATIVE mg/dL   Hgb urine dipstick SMALL (A) NEGATIVE   Bilirubin Urine NEGATIVE NEGATIVE   Ketones, ur NEGATIVE NEGATIVE mg/dL   Protein, ur NEGATIVE NEGATIVE mg/dL   Nitrite NEGATIVE NEGATIVE   Leukocytes,Ua NEGATIVE NEGATIVE   RBC / HPF 21-50 0 - 5 RBC/hpf   WBC, UA 0-5 0 - 5 WBC/hpf   Bacteria, UA NONE SEEN NONE SEEN   Squamous Epithelial / LPF NONE SEEN 0 - 5   Mucus PRESENT   CBC  Result Value Ref Range   WBC 10.7 (H) 4.0 - 10.5 K/uL   RBC 4.94 4.22 - 5.81 MIL/uL   Hemoglobin 13.9 13.0 - 17.0 g/dL   HCT 59.5 39 - 52 %   MCV 87.0 80.0 - 100.0 fL   MCH 28.1 26.0 - 34.0 pg   MCHC 32.3 30.0 - 36.0 g/dL   RDW 63.8 75.6 - 43.3 %   Platelets 328 150 - 400 K/uL   nRBC 0.0 0.0 - 0.2 %  Basic metabolic panel  Result Value Ref Range   Sodium 141 135 - 145 mmol/L   Potassium 3.7 3.5 - 5.1 mmol/L   Chloride 107 98 - 111 mmol/L   CO2 30 22 - 32 mmol/L   Glucose, Bld 125 (H) 70 - 99 mg/dL   BUN 15 6 - 20 mg/dL   Creatinine, Ser 2.95 (H) 0.61 - 1.24 mg/dL   Calcium 9.8 8.9 - 18.8 mg/dL   GFR calc non Af Amer >60 >60 mL/min   GFR calc Af Amer >60 >60 mL/min   Anion gap 4 (L) 5 - 15      Assessment & Plan:   Problem List Items Addressed This Visit      Other   Close exposure to COVID-19 virus - Primary    Acute symptoms at this time, suspect he will return with positive result.  Is getting tested this evening and alerted him to notify provider of result.  Recommend he self quarantine at this time for a total of 10 days.  Discussed at length with him current guideline treatments, sent in Tussionex and Albuterol for use as needed.  Unsure he would qualify for MAB based on current health + BMI <25.  Discussed with him.  Recommend he continue OTC medications for symptom relief + ensure plenty of rest and fluid intake.  Plan to follow-up in 10 days.         I discussed the assessment and treatment plan with the  patient. The patient was provided an opportunity to ask questions and all were answered. The patient agreed with the plan and demonstrated an understanding of the instructions.   The patient was advised to call back or seek an in-person evaluation if the symptoms worsen or if the condition fails to improve as anticipated.   I provided 21+ minutes of time during this encounter.  Follow up plan: Return in about 10 days (around 01/24/2020) for covid f/u.

## 2020-01-14 NOTE — Patient Instructions (Signed)
COVID-19 COVID-19 is a respiratory infection that is caused by a virus called severe acute respiratory syndrome coronavirus 2 (SARS-CoV-2). The disease is also known as coronavirus disease or novel coronavirus. In some people, the virus may not cause any symptoms. In others, it may cause a serious infection. The infection can get worse quickly and can lead to complications, such as:  Pneumonia, or infection of the lungs.  Acute respiratory distress syndrome or ARDS. This is a condition in which fluid build-up in the lungs prevents the lungs from filling with air and passing oxygen into the blood.  Acute respiratory failure. This is a condition in which there is not enough oxygen passing from the lungs to the body or when carbon dioxide is not passing from the lungs out of the body.  Sepsis or septic shock. This is a serious bodily reaction to an infection.  Blood clotting problems.  Secondary infections due to bacteria or fungus.  Organ failure. This is when your body's organs stop working. The virus that causes COVID-19 is contagious. This means that it can spread from person to person through droplets from coughs and sneezes (respiratory secretions). What are the causes? This illness is caused by a virus. You may catch the virus by:  Breathing in droplets from an infected person. Droplets can be spread by a person breathing, speaking, singing, coughing, or sneezing.  Touching something, like a table or a doorknob, that was exposed to the virus (contaminated) and then touching your mouth, nose, or eyes. What increases the risk? Risk for infection You are more likely to be infected with this virus if you:  Are within 6 feet (2 meters) of a person with COVID-19.  Provide care for or live with a person who is infected with COVID-19.  Spend time in crowded indoor spaces or live in shared housing. Risk for serious illness You are more likely to become seriously ill from the virus if you:   Are 50 years of age or older. The higher your age, the more you are at risk for serious illness.  Live in a nursing home or long-term care facility.  Have cancer.  Have a long-term (chronic) disease such as: ? Chronic lung disease, including chronic obstructive pulmonary disease or asthma. ? A long-term disease that lowers your body's ability to fight infection (immunocompromised). ? Heart disease, including heart failure, a condition in which the arteries that lead to the heart become narrow or blocked (coronary artery disease), a disease which makes the heart muscle thick, weak, or stiff (cardiomyopathy). ? Diabetes. ? Chronic kidney disease. ? Sickle cell disease, a condition in which red blood cells have an abnormal "sickle" shape. ? Liver disease.  Are obese. What are the signs or symptoms? Symptoms of this condition can range from mild to severe. Symptoms may appear any time from 2 to 14 days after being exposed to the virus. They include:  A fever or chills.  A cough.  Difficulty breathing.  Headaches, body aches, or muscle aches.  Runny or stuffy (congested) nose.  A sore throat.  New loss of taste or smell. Some people may also have stomach problems, such as nausea, vomiting, or diarrhea. Other people may not have any symptoms of COVID-19. How is this diagnosed? This condition may be diagnosed based on:  Your signs and symptoms, especially if: ? You live in an area with a COVID-19 outbreak. ? You recently traveled to or from an area where the virus is common. ? You   provide care for or live with a person who was diagnosed with COVID-19. ? You were exposed to a person who was diagnosed with COVID-19.  A physical exam.  Lab tests, which may include: ? Taking a sample of fluid from the back of your nose and throat (nasopharyngeal fluid), your nose, or your throat using a swab. ? A sample of mucus from your lungs (sputum). ? Blood tests.  Imaging tests, which  may include, X-rays, CT scan, or ultrasound. How is this treated? At present, there is no medicine to treat COVID-19. Medicines that treat other diseases are being used on a trial basis to see if they are effective against COVID-19. Your health care provider will talk with you about ways to treat your symptoms. For most people, the infection is mild and can be managed at home with rest, fluids, and over-the-counter medicines. Treatment for a serious infection usually takes places in a hospital intensive care unit (ICU). It may include one or more of the following treatments. These treatments are given until your symptoms improve.  Receiving fluids and medicines through an IV.  Supplemental oxygen. Extra oxygen is given through a tube in the nose, a face mask, or a hood.  Positioning you to lie on your stomach (prone position). This makes it easier for oxygen to get into the lungs.  Continuous positive airway pressure (CPAP) or bi-level positive airway pressure (BPAP) machine. This treatment uses mild air pressure to keep the airways open. A tube that is connected to a motor delivers oxygen to the body.  Ventilator. This treatment moves air into and out of the lungs by using a tube that is placed in your windpipe.  Tracheostomy. This is a procedure to create a hole in the neck so that a breathing tube can be inserted.  Extracorporeal membrane oxygenation (ECMO). This procedure gives the lungs a chance to recover by taking over the functions of the heart and lungs. It supplies oxygen to the body and removes carbon dioxide. Follow these instructions at home: Lifestyle  If you are sick, stay home except to get medical care. Your health care provider will tell you how long to stay home. Call your health care provider before you go for medical care.  Rest at home as told by your health care provider.  Do not use any products that contain nicotine or tobacco, such as cigarettes, e-cigarettes, and  chewing tobacco. If you need help quitting, ask your health care provider.  Return to your normal activities as told by your health care provider. Ask your health care provider what activities are safe for you. General instructions  Take over-the-counter and prescription medicines only as told by your health care provider.  Drink enough fluid to keep your urine pale yellow.  Keep all follow-up visits as told by your health care provider. This is important. How is this prevented?  There is no vaccine to help prevent COVID-19 infection. However, there are steps you can take to protect yourself and others from this virus. To protect yourself:   Do not travel to areas where COVID-19 is a risk. The areas where COVID-19 is reported change often. To identify high-risk areas and travel restrictions, check the CDC travel website: wwwnc.cdc.gov/travel/notices  If you live in, or must travel to, an area where COVID-19 is a risk, take precautions to avoid infection. ? Stay away from people who are sick. ? Wash your hands often with soap and water for 20 seconds. If soap and water   are not available, use an alcohol-based hand sanitizer. ? Avoid touching your mouth, face, eyes, or nose. ? Avoid going out in public, follow guidance from your state and local health authorities. ? If you must go out in public, wear a cloth face covering or face mask. Make sure your mask covers your nose and mouth. ? Avoid crowded indoor spaces. Stay at least 6 feet (2 meters) away from others. ? Disinfect objects and surfaces that are frequently touched every day. This may include:  Counters and tables.  Doorknobs and light switches.  Sinks and faucets.  Electronics, such as phones, remote controls, keyboards, computers, and tablets. To protect others: If you have symptoms of COVID-19, take steps to prevent the virus from spreading to others.  If you think you have a COVID-19 infection, contact your health care  provider right away. Tell your health care team that you think you may have a COVID-19 infection.  Stay home. Leave your house only to seek medical care. Do not use public transport.  Do not travel while you are sick.  Wash your hands often with soap and water for 20 seconds. If soap and water are not available, use alcohol-based hand sanitizer.  Stay away from other members of your household. Let healthy household members care for children and pets, if possible. If you have to care for children or pets, wash your hands often and wear a mask. If possible, stay in your own room, separate from others. Use a different bathroom.  Make sure that all people in your household wash their hands well and often.  Cough or sneeze into a tissue or your sleeve or elbow. Do not cough or sneeze into your hand or into the air.  Wear a cloth face covering or face mask. Make sure your mask covers your nose and mouth. Where to find more information  Centers for Disease Control and Prevention: www.cdc.gov/coronavirus/2019-ncov/index.html  World Health Organization: www.who.int/health-topics/coronavirus Contact a health care provider if:  You live in or have traveled to an area where COVID-19 is a risk and you have symptoms of the infection.  You have had contact with someone who has COVID-19 and you have symptoms of the infection. Get help right away if:  You have trouble breathing.  You have pain or pressure in your chest.  You have confusion.  You have bluish lips and fingernails.  You have difficulty waking from sleep.  You have symptoms that get worse. These symptoms may represent a serious problem that is an emergency. Do not wait to see if the symptoms will go away. Get medical help right away. Call your local emergency services (911 in the [place] Do not drive yourself to the hospital. Let the emergency medical personnel know if you think you have COVID-19. Summary  COVID-19 is a  respiratory infection that is caused by a virus. It is also known as coronavirus disease or novel coronavirus. It can cause serious infections, such as pneumonia, acute respiratory distress syndrome, acute respiratory failure, or sepsis.  The virus that causes COVID-19 is contagious. This means that it can spread from person to person through droplets from breathing, speaking, singing, coughing, or sneezing.  You are more likely to develop a serious illness if you are 50 years of age or older, have a weak immune system, live in a nursing home, or have chronic disease.  There is no medicine to treat COVID-19. Your health care provider will talk with you about ways to treat your symptoms.    Take steps to protect yourself and others from infection. Wash your hands often and disinfect objects and surfaces that are frequently touched every day. Stay away from people who are sick and wear a mask if you are sick. This information is not intended to replace advice given to you by your health care provider. Make sure you discuss any questions you have with your health care provider. Document Revised: 02/26/2019 Document Reviewed: 06/04/2018 Elsevier Patient Education  2020 [employer]  

## 2020-01-14 NOTE — Assessment & Plan Note (Signed)
Acute symptoms at this time, suspect he will return with positive result.  Is getting tested this evening and alerted him to notify provider of result.  Recommend he self quarantine at this time for a total of 10 days.  Discussed at length with him current guideline treatments, sent in Tussionex and Albuterol for use as needed.  Unsure he would qualify for MAB based on current health + BMI <25.  Discussed with him.  Recommend he continue OTC medications for symptom relief + ensure plenty of rest and fluid intake.  Plan to follow-up in 10 days.

## 2020-01-18 ENCOUNTER — Ambulatory Visit: Payer: Self-pay

## 2020-01-18 ENCOUNTER — Other Ambulatory Visit: Payer: Self-pay | Admitting: Nurse Practitioner

## 2020-01-18 DIAGNOSIS — U071 COVID-19: Secondary | ICD-10-CM | POA: Insufficient documentation

## 2020-01-18 NOTE — Telephone Encounter (Signed)
Spoke to wife and patient, have ordered imaging + scheduled him for MAB infusion in morning.  Sent directions via MyChart for infusion.

## 2020-01-18 NOTE — Progress Notes (Signed)
  I connected by phone with Sean Delacruz. on 01/18/2020 at 3:11 PM to discuss the potential use of an new treatment for mild to moderate COVID-19 viral infection in non-hospitalized patients.  Symptom onset 01/11/20 with positive testing.  Not vaccinated.  This patient is a 54 y.o. male that meets the FDA criteria for Emergency Use Authorization of bamlanivimab:  Has a (+) direct SARS-CoV-2 viral test result  Has mild or moderate COVID-19   Is ? 54 years of age and weighs ? 40 kg  Is NOT hospitalized due to COVID-19  Is NOT requiring oxygen therapy or requiring an increase in baseline oxygen flow rate due to COVID-19  Is within 10 days of symptom onset  Has at least one of the high risk factor(s) for progression to severe COVID-19 and/or hospitalization as defined in EUA.  Specific high risk criteria : Cardiovascular disease or hypertension  Reviewed patient chronic problem list, which is currently managed by their PCP.  I have spoken and communicated the following to the patient or parent/caregiver:  1. FDA has authorized the emergency use of bamlanivimab for the treatment of mild to moderate COVID-19 in adults and pediatric patients with positive results of direct SARS-CoV-2 viral testing who are 68 years of age and older weighing at least 40 kg, and who are at high risk for progressing to severe COVID-19 and/or hospitalization.  2. The significant known and potential risks and benefits of bamlanivimab, and the extent to which such potential risks and benefits are unknown.  3. Information on available alternative treatments and the risks and benefits of those alternatives, including clinical trials.  4. Patients treated with bamlanivimab should continue to self-isolate and use infection control measures (e.g., wear mask, isolate, social distance, avoid sharing personal items, clean and disinfect "high touch" surfaces, and frequent handwashing) according to CDC guidelines.    5. The patient or parent/caregiver has the option to accept or refuse bamlanivimab.  After reviewing this information with the patient, The patient agreed to proceed with receiving casirivimab\imdevimab infusion and will be provided a copy of the Fact sheet prior to receiving the infusion.  [name] [name] 01/18/2020 3:11 PM

## 2020-01-18 NOTE — Telephone Encounter (Signed)
Refer to previous note.

## 2020-01-18 NOTE — Telephone Encounter (Signed)
Pt c/o SOB with activity and  dizziness (that started yesterday  And stated he  had to hold onto objects to maintain balance.). Pt tested positive on Sunday for Covid 19.    Pt stated he walked outside to his yard and felt weak.Pt denies SOB at rest and stated the dizziness went away after he sat down. Pt stated he is eating and drinking and has no signs or sx of dehydration. Pt stated he is coughing up yellow phlegm and his chest hurts when he coughs Pt c/o  right arm soreness and nagging mild right ear pain that started yesterday.  Care advice given and pt advised to go to [facility] for evaluation and to call first and inform them of covid positive. Advised to call 911 for worsening difficulty breathing or dizziness. Pt verbalized understanding  Reason for Disposition . MILD difficulty breathing (e.g., minimal/no SOB at rest, SOB with walking, pulse <100)  Answer Assessment - Initial Assessment Questions 1. COVID-19 DIAGNOSIS: "Who made your Coronavirus (COVID-19) diagnosis?" "Was it confirmed by a positive lab test?" If not diagnosed by a HCP, ask "Are there lots of cases (community spread) where you live?" (See public health department website, if unsure)     Community drive through yes-yes 2. COVID-19 EXPOSURE: "Was there any known exposure to COVID before the symptoms began?" CDC Definition of close contact: within 6 feet (2 meters) for a total of 15 minutes or more over a 24-hour period.      Yes daughter 3. ONSET: "When did the COVID-19 symptoms start?"     Tuesday 4. WORST SYMPTOM: "What is your worst symptom?" (e.g., cough, fever, shortness of breath, muscle aches)     SOB and dizziness  5. COUGH: "Do you have a cough?" If Yes, ask: "How bad is the cough?"       Constant nagging cough- can coughing till her vomits 6. FEVER: "Do you have a fever?" If Yes, ask: "What is your temperature, how was it measured, and when did it start?"     No fever or aches or chills 7. RESPIRATORY STATUS:  "Describe your breathing?" (e.g., shortness of breath, wheezing, unable to speak)      SOB with activity, productive cough with yellow mucous. Taking Tylenol and prescription for cough with sleep 8. BETTER-SAME-WORSE: "Are you getting better, staying the same or getting worse compared to yesterday?"  If getting worse, ask, "In what way?"     Worse- SOB and cough and dizziness 9. HIGH RISK DISEASE: "Do you have any chronic medical problems?" (e.g., asthma, heart or lung disease, weak immune system, obesity, etc.)     no 10. PREGNANCY: "Is there any chance you are pregnant?" "When was your last menstrual period?"       n/a 11. OTHER SYMPTOMS: "Do you have any other symptoms?"  (e.g., chills, fatigue, headache, loss of smell or taste, muscle pain, sore throat; new loss of smell or taste especially support the diagnosis of COVID-19)      Fatigue, right ear nagging pain and right arm soreness,  Protocols used: CORONAVIRUS (COVID-19) DIAGNOSED OR SUSPECTED-A-AH

## 2020-01-18 NOTE — Telephone Encounter (Addendum)
Pt.'s wife calling back. Reports pt. Does not want to go to ED or UC as instructed earlier. Wife reports he had a visit 01/14/20. Reports pt.'s chest is sore from coughing. No fever. She has called several UC in their area and "some of them don't want to see COVID patients." Asking if chest xray can be ordered at [facility] in [place]. States she checked and they will see COVID 19 patients. Please advise.

## 2020-01-19 ENCOUNTER — Other Ambulatory Visit (HOSPITAL_COMMUNITY): Payer: Self-pay

## 2020-01-19 ENCOUNTER — Ambulatory Visit (HOSPITAL_COMMUNITY)
Admission: RE | Admit: 2020-01-19 | Discharge: 2020-01-19 | Disposition: A | Discharge status: Home / Self Care | Payer: No Typology Code available for payment source | Source: Ambulatory Visit | Attending: Pulmonary Disease | Admitting: Pulmonary Disease

## 2020-01-19 DIAGNOSIS — U071 COVID-19: Secondary | ICD-10-CM | POA: Insufficient documentation

## 2020-01-19 MED ORDER — SODIUM CHLORIDE 0.9 % IV SOLN: INTRAVENOUS | Status: DC | PRN

## 2020-01-19 MED ORDER — DIPHENHYDRAMINE HCL 50 MG/ML IJ SOLN: 50.0000 mg | Freq: Once | INTRAMUSCULAR | Status: DC | PRN

## 2020-01-19 MED ORDER — METHYLPREDNISOLONE SODIUM SUCC 125 MG IJ SOLR: 125.0000 mg | Freq: Once | INTRAMUSCULAR | Status: DC | PRN

## 2020-01-19 MED ORDER — FAMOTIDINE IN NACL 20-0.9 MG/50ML-% IV SOLN: 20.0000 mg | Freq: Once | INTRAVENOUS | Status: DC | PRN

## 2020-01-19 MED ORDER — EPINEPHRINE 0.3 MG/0.3ML IJ SOAJ: 0.3000 mg | Freq: Once | INTRAMUSCULAR | Status: DC | PRN

## 2020-01-19 MED ORDER — ALBUTEROL SULFATE HFA 108 (90 BASE) MCG/ACT IN AERS: 2.0000 | INHALATION_SPRAY | Freq: Once | RESPIRATORY_TRACT | Status: DC | PRN

## 2020-01-19 MED ORDER — SODIUM CHLORIDE 0.9 % IV SOLN
1200.0000 mg | Freq: Once | INTRAVENOUS | Status: AC
  Administered 2020-01-19: 1200 mg via INTRAVENOUS
  Filled 2020-01-19: qty 10

## 2020-01-19 NOTE — Discharge Instructions (Signed)

## 2020-01-19 NOTE — Progress Notes (Signed)
  Diagnosis: COVID-19  Physician:Dr. Patrick Wright  Procedure: Covid Infusion Clinic Med: casirivimab\imdevimab infusion - Provided patient with casirivimab\imdevimab fact sheet for patients, parents and caregivers prior to infusion.  Complications: No immediate complications noted.  Discharge: Discharged home   Sean Delacruz 01/19/2020   

## 2020-01-24 ENCOUNTER — Other Ambulatory Visit: Payer: Self-pay

## 2020-01-24 ENCOUNTER — Emergency Department: Payer: No Typology Code available for payment source

## 2020-01-24 ENCOUNTER — Emergency Department
Admission: EM | Admit: 2020-01-24 | Discharge: 2020-01-25 | Disposition: A | Discharge status: Home / Self Care | Payer: No Typology Code available for payment source | Attending: Emergency Medicine | Admitting: Emergency Medicine

## 2020-01-24 ENCOUNTER — Encounter: Payer: Self-pay | Admitting: Emergency Medicine

## 2020-01-24 ENCOUNTER — Ambulatory Visit: Payer: Self-pay | Admitting: Family Medicine

## 2020-01-24 DIAGNOSIS — R0602 Shortness of breath: Secondary | ICD-10-CM | POA: Diagnosis present

## 2020-01-24 DIAGNOSIS — Z87891 Personal history of nicotine dependence: Secondary | ICD-10-CM | POA: Diagnosis not present

## 2020-01-24 DIAGNOSIS — N23 Unspecified renal colic: Secondary | ICD-10-CM | POA: Diagnosis not present

## 2020-01-24 DIAGNOSIS — U071 COVID-19: Secondary | ICD-10-CM

## 2020-01-24 DIAGNOSIS — I10 Essential (primary) hypertension: Secondary | ICD-10-CM | POA: Insufficient documentation

## 2020-01-24 DIAGNOSIS — K5730 Diverticulosis of large intestine without perforation or abscess without bleeding: Secondary | ICD-10-CM | POA: Insufficient documentation

## 2020-01-24 DIAGNOSIS — Z79899 Other long term (current) drug therapy: Secondary | ICD-10-CM | POA: Insufficient documentation

## 2020-01-24 LAB — CBC
HCT: 43.6 % (ref 39.0–52.0)
Hemoglobin: 15 g/dL (ref 13.0–17.0)
MCH: 28.3 pg (ref 26.0–34.0)
MCHC: 34.4 g/dL (ref 30.0–36.0)
MCV: 82.3 fL (ref 80.0–100.0)
Platelets: 578 10*3/uL — ABNORMAL HIGH (ref 150–400)
RBC: 5.3 MIL/uL (ref 4.22–5.81)
RDW: 14 % (ref 11.5–15.5)
WBC: 7.7 10*3/uL (ref 4.0–10.5)
nRBC: 0 % (ref 0.0–0.2)

## 2020-01-24 LAB — HEPATIC FUNCTION PANEL
ALT: 48 U/L — ABNORMAL HIGH (ref 0–44)
AST: 36 U/L (ref 15–41)
Albumin: 2.7 g/dL — ABNORMAL LOW (ref 3.5–5.0)
Alkaline Phosphatase: 155 U/L — ABNORMAL HIGH (ref 38–126)
Bilirubin, Direct: 0.2 mg/dL (ref 0.0–0.2)
Indirect Bilirubin: 0.7 mg/dL (ref 0.3–0.9)
Total Bilirubin: 0.9 mg/dL (ref 0.3–1.2)
Total Protein: 6.8 g/dL (ref 6.5–8.1)

## 2020-01-24 LAB — BASIC METABOLIC PANEL
Anion gap: 9 (ref 5–15)
BUN: 10 mg/dL (ref 6–20)
CO2: 27 mmol/L (ref 22–32)
Calcium: 9.3 mg/dL (ref 8.9–10.3)
Chloride: 102 mmol/L (ref 98–111)
Creatinine, Ser: 1.1 mg/dL (ref 0.61–1.24)
GFR calc Af Amer: >60 mL/min (ref >60)
GFR calc non Af Amer: >60 mL/min (ref >60)
Glucose, Bld: 91 mg/dL (ref 70–99)
Potassium: 3.9 mmol/L (ref 3.5–5.1)
Sodium: 138 mmol/L (ref 135–145)

## 2020-01-24 LAB — TROPONIN I (HIGH SENSITIVITY)
Troponin I (High Sensitivity): 7 ng/L (ref <18)
Troponin I (High Sensitivity): 6 ng/L (ref <18)

## 2020-01-24 LAB — FIBRIN DERIVATIVES D-DIMER (ARMC ONLY): Fibrin derivatives D-dimer (ARMC): 1416.04 ng/mL (FEU) — ABNORMAL HIGH (ref 0.00–499.00)

## 2020-01-24 MED ORDER — SODIUM CHLORIDE 0.9 % IV BOLUS
1000.0000 mL | Freq: Once | INTRAVENOUS | Status: AC
  Administered 2020-01-24: 1000 mL via INTRAVENOUS

## 2020-01-24 MED ORDER — IOHEXOL 300 MG/ML  SOLN: 100.0000 mL | Freq: Once | INTRAMUSCULAR | Status: DC | PRN

## 2020-01-24 MED ORDER — ONDANSETRON HCL 4 MG/2ML IJ SOLN
4.0000 mg | Freq: Once | INTRAMUSCULAR | Status: AC
  Administered 2020-01-24: 4 mg via INTRAVENOUS
  Filled 2020-01-24: qty 2

## 2020-01-24 MED ORDER — IOHEXOL 9 MG/ML PO SOLN
1000.0000 mL | Freq: Once | ORAL | Status: AC | PRN
  Administered 2020-01-24: 1000 mL via ORAL

## 2020-01-24 NOTE — ED Triage Notes (Signed)
Patient presents to the ED with shortness of breath.  Patient was diagnosed with Covid19 on Sept. 1st.  Patient received an antibody infusion but states he hasn't had a lot of improvement.  Patient states he has been vomiting every time he eats for the past two weeks.  Patient states he hasn't had a fever since the infusion but difficulty eating still.  Patient reports shortness of breath, cough and ear pain.  Patient also reports chest pain with deep breathing.

## 2020-01-24 NOTE — ED Provider Notes (Signed)
[facility] Emergency Department Provider Note   ____________________________________________   First MD Initiated Contact with Patient 01/24/20 2211     (approximate)  I have reviewed the triage vital signs and the nursing notes.   HISTORY  Chief Complaint Shortness of Breath    HPI Sean AdeLawrence E Stinson Jr. is a 54 y.o. male who reports he was diagnosed with Covid in the beginning of September.  He did get an antibody infusion.  He says since then he has not had any more fever but his shortness of breath is gradually getting worse.  He gets short of breath when he walks.  He also says he has a earache and he has been having nausea and vomiting and cannot keep anything down especially for the last few days.  He has had right lower quadrant pain for the last 3 days.  He says his urine has been dark as well.  The abdominal pain is worse with palpation.  Is fairly constant dull achy pain.        Past Medical History:  Diagnosis Date  . Allergic rhinitis   . Dysphagia   . Gout   . History of cellulitis 11/2013 and 8/15  . Hyperlipidemia   . Hypertension     Patient Active Problem List   Diagnosis Date Noted  . Lab test positive for detection of COVID-19 virus 01/18/2020  . Close exposure to COVID-19 virus 01/14/2020  . Cellulitis of left hand 06/04/2017  . Infrapatellar bursitis of left knee 06/13/2015  . Hx of melanoma of skin 01/31/2015  . Hyperlipidemia   . Hypertension   . Gout   . Dysphagia     Past Surgical History:  Procedure Laterality Date  . HIATAL HERNIA REPAIR     x3  . KNEE ARTHROPLASTY    . KNEE SURGERY      Prior to Admission medications   Medication Sig Start Date End Date Taking? Authorizing Provider  albuterol (VENTOLIN HFA) 108 (90 Base) MCG/ACT inhaler Inhale 2 puffs into the lungs every 6 (six) hours as needed for wheezing or shortness of breath. 01/14/20   Cannady, Corrie DandyJolene T, NP  amLODipine (NORVASC) 10 MG tablet Take 1  tablet (10 mg total) by mouth daily. 03/28/15   Olevia PerchesJohnson, Megan P, DO  chlorpheniramine-HYDROcodone (TUSSIONEX PENNKINETIC ER) 10-8 MG/5ML SUER Take 5 mLs by mouth every 12 (twelve) hours as needed for cough. 01/14/20   Cannady, Corrie DandyJolene T, NP  colchicine 0.6 MG tablet Take 1 tablet (0.6 mg total) by mouth 2 (two) times daily as needed. 12/03/19 12/02/20  Particia NearingLane, Rachel Elizabeth, PA-C  Ibuprofen 200 MG CAPS Take by mouth as needed.    [provider]    Allergies Patient has no known allergies.  Family History  Problem Relation Age of Onset  . Hypertension Father   . Diabetes Father   . Stroke Mother   . Hypertension Paternal Grandmother   . Myasthenia gravis Other        grandfather  . Cancer Maternal Grandmother     Social History Social History   Tobacco Use  . Smoking status: Former Smoker    Packs/day: 1.25    Years: 8.00    Pack years: 10.00    Types: Cigarettes    Quit date: 05/13/1996    Years since quitting: 23.7  . Smokeless tobacco: Never Used  Vaping Use  . Vaping Use: Never used  Substance Use Topics  . Alcohol use: No  . Drug  use: No    Review of Systems  Constitutional: No fever/chills Eyes: No visual changes. ENT: No sore throat. Cardiovascular: Denies chest pain. Respiratory: hortness of breath. Gastrointestinal: abdominal pain.  nausea, vomiting.  No diarrhea.  No constipation. Genitourinary: Negative for dysuria. Musculoskeletal: Negative for back pain. Skin: Negative for rash. Neurological: Negative for headaches, focal weakness  ____________________________________________   PHYSICAL EXAM:  VITAL SIGNS: ED Triage Vitals  Enc Vitals Group     BP 01/24/20 1554 129/88     Pulse Rate 01/24/20 1554 66     Resp 01/24/20 1625 20     Temp 01/24/20 1554 (!) 97.5 F (36.4 C)     Temp Source 01/24/20 1554 Oral     SpO2 01/24/20 1554 93 %     Weight 01/24/20 1627 150 lb (68 kg)     Height 01/24/20 1627 5\' 9"  (1.753 m)     Head Circumference  --      Peak Flow --      Pain Score 01/24/20 1636 10     Pain Loc --      Pain Edu? --      Excl. in GC? --    Constitutional: Alert and oriented. Well appearing and in no acute distress. Eyes: Conjunctivae are normal.  Head: Atraumatic. Nose: No congestion/rhinnorhea. Mouth/Throat: Mucous membranes are moist.  Oropharynx non-erythematous. Neck: No stridor. ardiovascular: Normal rate, regular rhythm. Grossly normal heart sounds.  Good peripheral circulation. Respiratory: Normal respiratory effort.  No retractions. Lungs CTAB. Gastrointestinal: Soft and nontender except for in the right lower quadrant where there is tenderness to palpation.. No distention. No abdominal bruits. No CVA tenderness. }Musculoskeletal: No lower extremity tenderness nor edema.   Neurologic:  Normal speech and language. No gross focal neurologic deficits are appreciated. No gait instability. Skin:  Skin is warm, dry and intact. No rash noted.   ____________________________________________   LABS (all labs ordered are listed, but only abnormal results are displayed)  Labs Reviewed  CBC - Abnormal; Notable for the following components:      Result Value   Platelets 578 (*)    All other components within normal limits  FIBRIN DERIVATIVES D-DIMER ([facility] ONLY) - Abnormal; Notable for the following components:   Fibrin derivatives D-dimer ([facility]) 1,416.04 (*)    All other components within normal limits  HEPATIC FUNCTION PANEL - Abnormal; Notable for the following components:   Albumin 2.7 (*)    ALT 48 (*)    Alkaline Phosphatase 155 (*)    All other components within normal limits  BASIC METABOLIC PANEL  URINALYSIS, COMPLETE (UACMP) WITH MICROSCOPIC  TROPONIN I (HIGH SENSITIVITY)  TROPONIN I (HIGH SENSITIVITY)  TROPONIN I (HIGH SENSITIVITY)  TROPONIN I (HIGH SENSITIVITY)   ____________________________________________  EKG  EKG read interpreted by me shows normal sinus rhythm rate of 66 normal  axis.  There are flipped T's in lead III and F which are new from last year.  Additionally there may be a small amount of ST elevation in 1 and L but less than a millimeter. ____________________________________________  RADIOLOGY    Official radiology report(s): Patient's chest x-ray shows bilateral multifocal pneumonia read by radiology reviewed by me.  Patient CT also shows bilateral multifocal pneumonia but no PE I also reviewed those films and I reviewed his abdominal CT which shows a left-sided kidney stone.  Radiology reviews the film or reads the film inside the appendix looks normal. DG Chest 2 View  Result Date: 01/24/2020 CLINICAL DATA:  Chest pain. EXAM: CHEST - 2 VIEW COMPARISON:  August 29, 2014. FINDINGS: The heart size and mediastinal contours are within normal limits. No pneumothorax or pleural effusion is noted. Multiple ill-defined airspace opacities are noted throughout both lungs most consistent with multifocal pneumonia due to COVID-19. The visualized skeletal structures are unremarkable. IMPRESSION: Bilateral multifocal pneumonia. Electronically Signed   By: Lupita Raider M.D.   On: 01/24/2020 17:01   CT Angio Chest PE W and/or Wo Contrast  Result Date: 01/25/2020 CLINICAL DATA:  Shortness of breath.  COVID positive. EXAM: CT ANGIOGRAPHY CHEST WITH CONTRAST TECHNIQUE: Multidetector CT imaging of the chest was performed using the standard protocol during bolus administration of intravenous contrast. Multiplanar CT image reconstructions and MIPs were obtained to evaluate the vascular anatomy. CONTRAST:  57mL OMNIPAQUE IOHEXOL 350 MG/ML SOLN COMPARISON:  None. FINDINGS: Cardiovascular: Satisfactory opacification of the pulmonary arteries to the segmental level. No evidence of pulmonary embolism. Normal heart size with moderate severity coronary artery calcification. No pericardial effusion. Mediastinum/Nodes: No enlarged mediastinal, hilar, or axillary lymph nodes. Thyroid gland,  trachea, and esophagus demonstrate no significant findings. Lungs/Pleura: Marked severity multifocal infiltrates are seen within the bilateral lower lobes with mild to moderate severity posterior bilateral upper lobe multifocal infiltrates. There is no evidence of a pleural effusion or pneumothorax. Upper Abdomen: A moderate to large hiatal hernia is seen. Multiple surgical clips are seen within the left upper quadrant. Musculoskeletal: No chest wall abnormality. No acute or significant osseous findings. Review of the MIP images confirms the above findings. IMPRESSION: 1. No CT evidence of pulmonary embolism. 2. Marked severity bilateral multifocal infiltrates. 3. Moderate to large hiatal hernia. Electronically Signed   By: Aram Candela M.D.   On: 01/25/2020 00:46   CT ABDOMEN PELVIS W CONTRAST  Result Date: 01/25/2020 CLINICAL DATA:  Right lower quadrant abdominal pain. EXAM: CT ABDOMEN AND PELVIS WITH CONTRAST TECHNIQUE: Multidetector CT imaging of the abdomen and pelvis was performed using the standard protocol following bolus administration of intravenous contrast. CONTRAST:  23mL OMNIPAQUE IOHEXOL 350 MG/ML SOLN COMPARISON:  June 01, 2019 FINDINGS: Lower chest: Marked severity multifocal infiltrates are seen within the bilateral lung bases. Hepatobiliary: No focal liver abnormality is seen. No gallstones, gallbladder wall thickening, or biliary dilatation. Pancreas: Unremarkable. No pancreatic ductal dilatation or surrounding inflammatory changes. Spleen: Normal in size without focal abnormality. Adrenals/Urinary Tract: Adrenal glands are unremarkable. Kidneys are normal in size, without focal lesions. A 4 mm obstructing renal stone is seen within the distal left ureter, near the left UVJ, with mild left-sided hydronephrosis and hydroureter. Mild diffuse urinary bladder wall thickening is seen. Stomach/Bowel: There is a moderate-sized hiatal hernia. Adjacent surgical clips are noted, posteriorly.  Appendix appears normal. No evidence of bowel wall thickening, distention, or inflammatory changes. Noninflamed diverticula are seen within the sigmoid colon. Radiopaque surgical clips are seen within the left upper quadrant. Vascular/Lymphatic: No significant vascular findings are present. No enlarged abdominal or pelvic lymph nodes. Reproductive: The prostate gland is mildly enlarged. Other: No abdominal wall hernia or abnormality. No abdominopelvic ascites. Musculoskeletal: No acute or significant osseous findings. IMPRESSION: 1. 4 mm obstructing renal stone within the distal left ureter, near the left UVJ, with mild left-sided hydronephrosis and hydroureter. 2. Marked severity multifocal infiltrates within the bilateral lung bases. 3. Moderate-sized hiatal hernia. 4. Sigmoid diverticulosis. Electronically Signed   By: Aram Candela M.D.   On: 01/25/2020 00:44    ____________________________________________   PROCEDURES  Procedure(s) performed (including Critical Care):  Procedures   ____________________________________________   INITIAL IMPRESSION / ASSESSMENT AND PLAN / ED COURSE    Patient with a left-sided kidney stone which is obstructing.  We will get a urinalysis and patient will be ambulated to see if he is hypoxic when he ambulates.  I am signing the patient out to the oncoming provider.          ____________________________________________   FINAL CLINICAL IMPRESSION(S) / ED DIAGNOSES  Final diagnoses:  Renal colic on left side     ED Discharge Orders    None      *Please note:  Sean Delacruz. was evaluated in Emergency Department on 01/25/2020 for the symptoms described in the history of present illness. He was evaluated in the context of the global COVID-19 pandemic, which necessitated consideration that the patient might be at risk for infection with the SARS-CoV-2 virus that causes COVID-19. Institutional protocols and algorithms that pertain to the  evaluation of patients at risk for COVID-19 are in a state of rapid change based on information released by regulatory bodies including the CDC and federal and state organizations. These policies and algorithms were followed during the patient's care in the ED.  Some ED evaluations and interventions may be delayed as a result of limited staffing during and the pandemic.*   Note:  This document was prepared using Dragon voice recognition software and may include unintentional dictation errors.    Arnaldo Natal, MD 01/25/20 [phone]

## 2020-01-24 NOTE — ED Notes (Signed)
Rainbow sent to the lab.  

## 2020-01-24 NOTE — Telephone Encounter (Signed)
Noted, agree with this plan so can obtain imaging and fluids.  Will need follow-up post ER, virtual.

## 2020-01-24 NOTE — Telephone Encounter (Signed)
Pt's wife called on his behalf to report worsening symptoms since Saturday.  Hx of COVID positive 9/2 and had Infusion on 9/8.  Was doing well per wife, until Saturday.  C/o dizziness, headache and right ear ache today.  Intermittent cough with green mucus.  Afebrile since Thurs.  Intermittent vomiting or having dry heaves x 3 days.  Taking fluids in small amts.  C/o worsening in acid reflux.  C/o soreness in chest from coughing and vomiting.  Wife reported pt. gets short of breath with activity of walking short distance in house.  Reported he tires easily.  Urine dark yellow, and decreased in amt.  Denied diarrhea at this time; has diarrhea last week.    Called Post COVID care clinic.  Advised that they would not be able to do any IV hydration.  Advised he would need to be treated at the ER.  Advised pt's wife that he would need to go to the ER due to current symptoms; poss. dehydration.  Verb. Understanding.  Will route triage note to PCP.     Reason for Disposition  [1] Drinking very little AND [2] dehydration suspected (e.g., no urine > 12 hours, very dry mouth, very lightheaded)  Answer Assessment - Initial Assessment Questions 1. COVID-19 ONSET: "When did the symptoms of COVID-19 first start?"  8/31-9/1      2. DIAGNOSIS CONFIRMATION: "How were you diagnosed?" (e.g., COVID-19 oral or nasal viral test; COVID-19 antibody test; doctor visit)     Tested positive 9/2 3. MAIN SYMPTOM:  "What is your main concern or symptom right now?" (e.g., breathing difficulty, cough, fatigue. loss of smell)    Continued dizziness, headache, and right ear pain  4. SYMPTOM ONSET: "When did the Sx's start?"     8/31-9/1; symptoms have worsened since Saturday 5. BETTER-SAME-WORSE: "Are you getting better, staying the same, or getting worse over the last 1 to 2 weeks?"     worse 6. RECENT MEDICAL VISIT: "Have you been seen by a healthcare provider (doctor, NP, PA) for these persisting COVID-19 symptoms?" If Yes,  ask: "When were you seen?" (e.g., date)     Had COVID Infusion 9/8 7. COUGH: "Do you have a cough?" If Yes, ask: "How bad is the cough?"       Yes; intermittent cough and productive of small amt. Green mucus 8. FEVER: "Do you have a fever?" If Yes, ask: "What is your temperature, how was it measured, and when did it start?"     Denied fever 9. BREATHING DIFFICULTY: "Are you having any trouble breathing?" If Yes, ask: "How bad is your breathing?" (e.g., mild, moderate, severe)    - MILD: No SOB at rest, mild SOB with walking, speaks normally in sentences, can lay down, no retractions, pulse < 100.    - MODERATE: SOB at rest, SOB with minimal exertion and prefers to sit, cannot lie down flat, speaks in phrases, mild retractions, audible wheezing, pulse 100-120.    - SEVERE: Very SOB at rest, speaks in single words, struggling to breathe, sitting hunched forward, retractions, pulse > 120       Mild-moderate with activity; tires easily 10. HIGH RISK DISEASE: "Do you have any chronic medical problems?" (e.g., asthma, heart or lung disease, weak immune system, obesity, etc.)       Denied the above 11. PREGNANCY: "Is there any chance you are pregnant?" "When was your last menstrual period?"       n/a 12. OTHER SYMPTOMS: "Do you have any  other symptoms?"  (e.g., fatigue, headache, muscle pain, weakness)       HA, dizziness, fatigue, SOB with activity, right earache, vomiting and dry heaves.  Protocols used: CORONAVIRUS (COVID-19) PERSISTING SYMPTOMS FOLLOW-UP CALL-A-AH

## 2020-01-25 LAB — URINALYSIS, COMPLETE (UACMP) WITH MICROSCOPIC
Bacteria, UA: NONE SEEN
Bilirubin Urine: NEGATIVE
Glucose, UA: NEGATIVE mg/dL
Ketones, ur: 5 mg/dL — AB
Leukocytes,Ua: NEGATIVE
Nitrite: NEGATIVE
Protein, ur: NEGATIVE mg/dL
Specific Gravity, Urine: 1.041 — ABNORMAL HIGH (ref 1.005–1.030)
Squamous Epithelial / HPF: NONE SEEN (ref 0–5)
pH: 7 (ref 5.0–8.0)

## 2020-01-25 LAB — TROPONIN I (HIGH SENSITIVITY): Troponin I (High Sensitivity): 7 ng/L (ref <18)

## 2020-01-25 MED ORDER — TAMSULOSIN HCL 0.4 MG PO CAPS: 0.4000 mg | ORAL_CAPSULE | Freq: Every day | ORAL | 0 refills | Status: AC

## 2020-01-25 MED ORDER — KETOROLAC TROMETHAMINE 30 MG/ML IJ SOLN
15.0000 mg | Freq: Once | INTRAMUSCULAR | Status: AC
  Administered 2020-01-25: 15 mg via INTRAVENOUS
  Filled 2020-01-25: qty 1

## 2020-01-25 MED ORDER — IBUPROFEN 600 MG PO TABS: 600.0000 mg | ORAL_TABLET | Freq: Four times a day (QID) | ORAL | 0 refills | Status: DC | PRN

## 2020-01-25 MED ORDER — ALUM & MAG HYDROXIDE-SIMETH 200-200-20 MG/5ML PO SUSP
30.0000 mL | Freq: Once | ORAL | Status: AC
  Administered 2020-01-25: 30 mL via ORAL
  Filled 2020-01-25: qty 30

## 2020-01-25 MED ORDER — OXYCODONE-ACETAMINOPHEN 5-325 MG PO TABS
1.0000 | ORAL_TABLET | Freq: Once | ORAL | Status: AC
  Administered 2020-01-25: 1 via ORAL
  Filled 2020-01-25: qty 1

## 2020-01-25 MED ORDER — ONDANSETRON 4 MG PO TBDP: 4.0000 mg | ORAL_TABLET | Freq: Three times a day (TID) | ORAL | 0 refills | Status: DC | PRN

## 2020-01-25 MED ORDER — IOHEXOL 350 MG/ML SOLN
75.0000 mL | Freq: Once | INTRAVENOUS | Status: AC | PRN
  Administered 2020-01-25: 75 mL via INTRAVENOUS

## 2020-01-25 NOTE — Discharge Instructions (Addendum)
To help boost your immune system against COVID-19, please take:  - Vitamin D3 4,000 IU/day - Vitamin C 500-1,000?mg twice a day - Quercetin 250?mg twice a day - Zinc 100?mg/day - Melatonin 10?mg before bedtime (causes drowsiness) - Aspirin 325?mg/day (unless contraindicated) - Pulse Oximeter Monitoring of oxygen saturation is recommended - check your oxygen 3 times a day if less than 90% return to the ER  These medications are all over-the-counter and do not need a prescription.

## 2020-01-25 NOTE — ED Notes (Signed)
Ambulated patient around room. Pt maintained O2 sat of 96% on room air after sustained walking for 4 minutes. Pt returned to bed and resting comfortably

## 2020-01-25 NOTE — ED Provider Notes (Signed)
Accepted care of this patient from Dr. Darnelle Catalan at 1:30AM. Patient here with SOB in the setting of + covid infection. Found to have a ureteral stone. Pending UA and ambulatory sats.  Patient ambulated with no difficulty and kept his sats above 95%.  Has normal work of breathing.  UA negative for UTI.  Pain is well controlled with oral medications.  Will discharge home with ibuprofen, Flomax, Zofran and follow-up with PCP.  Discussed quarantine and my standard return precautions   Sean Delacruz, [place], MD 01/25/20 [phone]

## 2020-01-27 ENCOUNTER — Encounter: Payer: Self-pay | Admitting: Unknown Physician Specialty

## 2020-01-27 ENCOUNTER — Telehealth: Payer: No Typology Code available for payment source | Admitting: Nurse Practitioner

## 2020-01-27 ENCOUNTER — Telehealth (INDEPENDENT_AMBULATORY_CARE_PROVIDER_SITE_OTHER): Payer: No Typology Code available for payment source | Admitting: Unknown Physician Specialty

## 2020-01-27 VITALS — Wt 158.0 lb

## 2020-01-27 DIAGNOSIS — J129 Viral pneumonia, unspecified: Secondary | ICD-10-CM

## 2020-01-27 DIAGNOSIS — R0602 Shortness of breath: Secondary | ICD-10-CM | POA: Diagnosis not present

## 2020-01-27 MED ORDER — DOXYCYCLINE HYCLATE 100 MG PO TABS: 100.0000 mg | ORAL_TABLET | Freq: Two times a day (BID) | ORAL | 0 refills | Status: DC

## 2020-01-27 MED ORDER — PREDNISONE 20 MG PO TABS: 40.0000 mg | ORAL_TABLET | Freq: Every day | ORAL | 0 refills | Status: DC

## 2020-01-27 NOTE — Progress Notes (Addendum)
Wt 158 lb (71.7 kg)   BMI 23.33 kg/m    Subjective:    Patient ID: Sean Ade., male    DOB: 12-12-1965, 54 y.o.   MRN: [id number]  HPI: Sean Balandran. is a 54 y.o. male  Chief Complaint  Patient presents with  . Covid Exposure    follow up   This visit was completed via telephone due to the restrictions of the COVID-19 pandemic. All issues as above were discussed and addressed but no physical exam was performed. If it was felt that the patient should be evaluated in the office, they were directed there. The patient verbally consented to this visit. Patient was unable to complete an audio/visual visit due to Technical difficulties,Lack of internet. . Location of the patient: home . Location of the provider: work . Those involved with this call:  . Provider: Mardene Celeste, DNP . CMA: Elton Sin, CMA . Front Desk/Registration: Harriet Pho  . Time spent on call: 10 minutes on the phone discussing health concerns. 15 minutes total spent in review of patient's record and preparation of their chart.  I verified patient identity using two factors (patient name and date of birth). Patient consents verbally to being seen via telemedicine visit today.   Pt with Covid 19 symptoms since August 30th.  Received the mab infusion at 9 days of sxs.  Felt better and no fever since.  Worsening symptoms 5 days later with severe vomiting.  Went to the ER for hydration and diagnosed with Covid pneumonia and a small kidney stone.    Today, pt states he feels OK but chest is tight and he has a cough.  He is able to keep food down at this time and denies nausea and vomiting.  OTC cough meds cause him to feel sick.  Coughing green sputum.  SOB with activity.  Stays tired a lot.    Relevant past medical, surgical, family and social history reviewed and updated as indicated. Interim medical history since our last visit reviewed. Allergies and medications reviewed and updated.  Review of  Systems  Constitutional: Positive for fatigue. Negative for appetite change, chills and fever.  Respiratory: Positive for cough and shortness of breath.   Cardiovascular: Negative.   Gastrointestinal: Negative.   Genitourinary: Negative.   Psychiatric/Behavioral: Negative.     Per HPI unless specifically indicated above     Objective:    Wt 158 lb (71.7 kg)   BMI 23.33 kg/m   Wt Readings from Last 3 Encounters:  01/27/20 158 lb (71.7 kg)  01/24/20 150 lb (68 kg)  12/03/19 153 lb (69.4 kg)    Physical Exam Pulmonary:     Comments: Can hear a cough Neurological:     Mental Status: He is alert and oriented to person, place, and time.    ER notes reviewed consistent with pt's narrative and noted patchy infiltrates on x-ray consistent with Covid pneumonia.      Assessment & Plan:   Problem List Items Addressed This Visit    None    Visit Diagnoses    Viral pneumonia    -  Primary   Will rx prednisone and inhaler.  R/o secondary bacterial with Doxycycline 100 mg BID.     Relevant Medications   doxycycline (VIBRA-TABS) 100 MG tablet   Shortness of breath       Albuterol prn.  By patient's description not taking it correctly.  Directed to you tube video.  Follow up plan: Return in about 1 week (around 02/03/2020).

## 2020-01-28 ENCOUNTER — Telehealth: Payer: No Typology Code available for payment source | Admitting: Family Medicine

## 2020-02-03 ENCOUNTER — Telehealth (INDEPENDENT_AMBULATORY_CARE_PROVIDER_SITE_OTHER): Payer: No Typology Code available for payment source | Admitting: Family Medicine

## 2020-02-03 ENCOUNTER — Encounter: Payer: Self-pay | Admitting: Family Medicine

## 2020-02-03 VITALS — Wt 155.0 lb

## 2020-02-03 DIAGNOSIS — J129 Viral pneumonia, unspecified: Secondary | ICD-10-CM | POA: Diagnosis not present

## 2020-02-03 NOTE — Progress Notes (Signed)
Wt 155 lb (70.3 kg)   BMI 22.89 kg/m    Subjective:    Patient ID: Sean Ade., male    DOB: April 07, 1966, 54 y.o.   MRN: [id number]  HPI: Sean Delacruz. is a 54 y.o. male  Chief Complaint  Patient presents with  . Covid Exposure    release note to go back to work   Feeling much better. He did well with the prednisone and the antibiotic. Still feeling a little weak and tired. No fever. No chills. Generally feeing a lot better. No other concerns or complaints. 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   Relevant past medical, surgical, family and social history reviewed and updated as indicated. Interim medical history since our last visit reviewed. Allergies and medications reviewed and updated.  Review of Systems  Constitutional: Positive for fatigue. Negative for activity change, appetite change, chills, diaphoresis, fever and unexpected weight change.  HENT: Negative.   Respiratory: Negative.   Cardiovascular: Negative.   Musculoskeletal: Negative.   Neurological: Negative.   Psychiatric/Behavioral: Negative.     Per HPI unless specifically indicated above     Objective:    Wt 155 lb (70.3 kg)   BMI 22.89 kg/m   Wt Readings from Last 3 Encounters:  02/03/20 155 lb (70.3 kg)  01/27/20 158 lb (71.7 kg)  01/24/20 150 lb (68 kg)    Physical Exam Vitals and nursing note reviewed.  Pulmonary:     Effort: Pulmonary effort is normal. No respiratory distress.     Comments: Speaking in full sentences Neurological:     Mental Status: He is alert.  Psychiatric:        Mood and Affect: Mood normal.        Behavior: Behavior normal.        Thought Content: Thought content normal.        Judgment: Judgment normal.     Results for orders placed or performed during the hospital encounter of 01/24/20  Basic metabolic panel  Result Value Ref Range   Sodium 138 135 - 145 mmol/L   Potassium 3.9 3.5 - 5.1  mmol/L   Chloride 102 98 - 111 mmol/L   CO2 27 22 - 32 mmol/L   Glucose, Bld 91 70 - 99 mg/dL   BUN 10 6 - 20 mg/dL   Creatinine, Ser 3.24 0.61 - 1.24 mg/dL   Calcium 9.3 8.9 - 40.1 mg/dL   GFR calc non Af Amer >60 >60 mL/min   GFR calc Af Amer >60 >60 mL/min   Anion gap 9 5 - 15  CBC  Result Value Ref Range   WBC 7.7 4.0 - 10.5 K/uL   RBC 5.30 4.22 - 5.81 MIL/uL   Hemoglobin 15.0 13.0 - 17.0 g/dL   HCT 02.7 39 - 52 %   MCV 82.3 80.0 - 100.0 fL   MCH 28.3 26.0 - 34.0 pg   MCHC 34.4 30.0 - 36.0 g/dL   RDW 25.3 66.4 - 40.3 %   Platelets 578 (H) 150 - 400 K/uL   nRBC 0.0 0.0 - 0.2 %  Fibrin derivatives D-Dimer  Result Value Ref Range   Fibrin derivatives D-dimer ([facility]) 1,416.04 (H) 0.00 - 499.00 ng/mL (FEU)  Hepatic function panel  Result Value Ref Range   Total Protein 6.8 6.5 - 8.1 g/dL   Albumin 2.7 (L) 3.5 - 5.0 g/dL   AST 36 15 -  41 U/L   ALT 48 (H) 0 - 44 U/L   Alkaline Phosphatase 155 (H) 38 - 126 U/L   Total Bilirubin 0.9 0.3 - 1.2 mg/dL   Bilirubin, Direct 0.2 0.0 - 0.2 mg/dL   Indirect Bilirubin 0.7 0.3 - 0.9 mg/dL  Urinalysis, Complete w Microscopic Urine, Clean Catch  Result Value Ref Range   Color, Urine YELLOW (A) YELLOW   APPearance CLEAR (A) CLEAR   Specific Gravity, Urine 1.041 (H) 1.005 - 1.030   pH 7.0 5.0 - 8.0   Glucose, UA NEGATIVE NEGATIVE mg/dL   Hgb urine dipstick SMALL (A) NEGATIVE   Bilirubin Urine NEGATIVE NEGATIVE   Ketones, ur 5 (A) NEGATIVE mg/dL   Protein, ur NEGATIVE NEGATIVE mg/dL   Nitrite NEGATIVE NEGATIVE   Leukocytes,Ua NEGATIVE NEGATIVE   RBC / HPF 0-5 0 - 5 RBC/hpf   WBC, UA 0-5 0 - 5 WBC/hpf   Bacteria, UA NONE SEEN NONE SEEN   Squamous Epithelial / LPF NONE SEEN 0 - 5   Mucus PRESENT   Troponin I (High Sensitivity)  Result Value Ref Range   Troponin I (High Sensitivity) 7 <18 ng/L  Troponin I (High Sensitivity)  Result Value Ref Range   Troponin I (High Sensitivity) 6 <18 ng/L  Troponin I (High Sensitivity)  Result  Value Ref Range   Troponin I (High Sensitivity) 7 <18 ng/L      Assessment & Plan:   Problem List Items Addressed This Visit    None    Visit Diagnoses    Viral pneumonia    -  Primary   Resolved. Feeling better. Continue rest as able. OK to return to work on Monday. Call with any concerns.        Follow up plan: Return if symptoms worsen or fail to improve.   . This visit was completed via telephone due to the restrictions of the COVID-19 pandemic. All issues as above were discussed and addressed but no physical exam was performed. If it was felt that the patient should be evaluated in the office, they were directed there. The patient verbally consented to this visit. Patient was unable to complete an audio/visual visit due to Lack of equipment. Due to the catastrophic nature of the COVID-19 pandemic, this visit was done through audio contact only. . Location of the patient: home . Location of the provider: work . Those involved with this call:  . Provider: Olevia Perches, DO . CMA: Elton Sin, CMA . Front Desk/Registration: Adela Ports  . Time spent on call: 12 minutes with patient face to face via video conference. More than 50% of this time was spent in counseling and coordination of care. 15 minutes total spent in review of patient's record and preparation of their chart.

## 2020-05-13 DIAGNOSIS — G459 Transient cerebral ischemic attack, unspecified: Secondary | ICD-10-CM

## 2020-05-13 HISTORY — DX: Transient cerebral ischemic attack, unspecified: G45.9

## 2020-05-22 ENCOUNTER — Telehealth: Payer: Self-pay

## 2020-05-22 NOTE — Telephone Encounter (Signed)
This can be in person appt if they would rather have. Likely will need exam and labs.

## 2020-05-22 NOTE — Telephone Encounter (Signed)
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 Scheduled as virtual but Pt's wife wanted to ask if they could come in office?

## 2020-05-22 NOTE — Telephone Encounter (Signed)
Called Sean Delacruz advised of Dr Madelaine Etienne message

## 2020-05-24 ENCOUNTER — Ambulatory Visit: Payer: No Typology Code available for payment source | Admitting: Family Medicine

## 2020-05-31 ENCOUNTER — Other Ambulatory Visit: Payer: Self-pay

## 2020-05-31 ENCOUNTER — Encounter: Payer: Self-pay | Admitting: Family Medicine

## 2020-05-31 ENCOUNTER — Ambulatory Visit: Payer: No Typology Code available for payment source | Admitting: Family Medicine

## 2020-05-31 ENCOUNTER — Ambulatory Visit (INDEPENDENT_AMBULATORY_CARE_PROVIDER_SITE_OTHER): Payer: No Typology Code available for payment source | Admitting: Family Medicine

## 2020-05-31 VITALS — BP 150/88 | HR 66 | Temp 98.2°F | Wt 152.0 lb

## 2020-05-31 DIAGNOSIS — G441 Vascular headache, not elsewhere classified: Secondary | ICD-10-CM

## 2020-05-31 DIAGNOSIS — I10 Essential (primary) hypertension: Secondary | ICD-10-CM | POA: Diagnosis not present

## 2020-05-31 DIAGNOSIS — R519 Headache, unspecified: Secondary | ICD-10-CM | POA: Insufficient documentation

## 2020-05-31 DIAGNOSIS — Z23 Encounter for immunization: Secondary | ICD-10-CM | POA: Diagnosis not present

## 2020-05-31 MED ORDER — LOSARTAN POTASSIUM 25 MG PO TABS: 25.0000 mg | ORAL_TABLET | Freq: Every day | ORAL | 0 refills | Status: DC

## 2020-05-31 NOTE — Assessment & Plan Note (Signed)
Uncontrolled, has been out of meds. Contributing to headache. Will start losartan. Obtain BMP. F/u in 2 weeks.

## 2020-05-31 NOTE — Progress Notes (Signed)
   SUBJECTIVE:   CHIEF COMPLAINT / HPI:   Patient Active Problem List   Diagnosis Date Noted  . Headache 05/31/2020  . Lab test positive for detection of COVID-19 virus 01/18/2020  . Close exposure to COVID-19 virus 01/14/2020  . Cellulitis of left hand 06/04/2017  . Infrapatellar bursitis of left knee 06/13/2015  . Hx of melanoma of skin 01/31/2015  . Hyperlipidemia   . Hypertension   . Gout   . Dysphagia    HEADACHE - 2 weeks duration. - At one point last week, L hand went numb for about 10 minutes, slef resolved. This morning R hand went numb with cramping this morning went away after 10 minutes, at that time had difficulty formulating words, self-resolved. - has been out of amlodipine for a few months. - FH of stroke, mom with 3 prior strokes (60s), grandfather. - no sick contacts. Previously had COVID in 01/2020.  Duration: 2 weeks Severity: moderate Quality: throbbing Frequency: constant but fluctuating Location: frontal, back of neck Radiation: no Alleviating factors: none Aggravating factors: bending down, lifting Headache status at time of visit: current headache Treatments attempted: Treatments attempted: ibuprofen   Aura: yes Nausea:  no Vomiting: no Photophobia:  no Phonophobia:  yes Effect on social functioning:  no Numbers of missed days of school/work each month: 0 Confusion:  no Gait disturbance/ataxia:  no Behavioral changes:  no Fevers:  no   OBJECTIVE:   BP (!) 150/88   Pulse 66   Temp 98.2 F (36.8 C)   Wt 152 lb (68.9 kg)   SpO2 97%   BMI 22.45 kg/m   Gen: well appearing, in NAD Card: RRR, no murmur MSK: Full ROM, strength 5/5 to U/LE bilaterally, deep reflexes intact, normal gait.  No edema.  Neuro: Alert and oriented, speech normal.  Optic field normal. PERRL, Extraocular movements intact.  Intact symmetric sensation to light touch of face and extremities bilaterally.  Hearing grossly intact bilaterally.  Tongue protrudes normally  with no deviation.  Shoulder shrug, smile symmetric. Finger to nose normal.  ASSESSMENT/PLAN:   Hypertension Uncontrolled, has been out of meds. Contributing to headache. Will start losartan. Obtain BMP. F/u in 2 weeks.   Headache Concern for hypertensive urgency, possible TIA given self-resolved hand symptoms and word finding difficulties. No findings on exam to concern for current stroke, neuro exam wnl. Will work to control BP, losartan Rx sent. Will obtain risk stratification labs. F/u in 2 weeks, may require med titration at that visit. Strict emergency precautions discussed.     Caro Laroche, DO

## 2020-05-31 NOTE — Assessment & Plan Note (Signed)
Concern for hypertensive urgency, possible TIA given self-resolved hand symptoms and word finding difficulties. No findings on exam to concern for current stroke, neuro exam wnl. Will work to control BP, losartan Rx sent. Will obtain risk stratification labs. F/u in 2 weeks, may require med titration at that visit. Strict emergency precautions discussed.

## 2020-05-31 NOTE — Patient Instructions (Signed)
It was great to see you!  Our plans for today:  - We are starting a new medication for your blood pressure.  - Come back in 2 weeks for follow up. - If you develop hand numbness or difficulty speaking that doesn't resolve in a few minutes, go to the Emergency Department.   We are checking some labs today, we will release these results to your MyChart.  Take care and seek immediate care sooner if you develop any concerns.   Dr. Linwood Dibbles

## 2020-06-01 ENCOUNTER — Other Ambulatory Visit: Payer: Self-pay | Admitting: Family Medicine

## 2020-06-01 LAB — LIPID PANEL
Chol/HDL Ratio: 4.5 ratio (ref 0.0–5.0)
Cholesterol, Total: 204 mg/dL — ABNORMAL HIGH (ref 100–199)
HDL: 45 mg/dL (ref >39)
LDL Chol Calc (NIH): 137 mg/dL — ABNORMAL HIGH (ref 0–99)
Triglycerides: 120 mg/dL (ref 0–149)
VLDL Cholesterol Cal: 22 mg/dL (ref 5–40)

## 2020-06-01 LAB — BASIC METABOLIC PANEL
BUN/Creatinine Ratio: 10 (ref 9–20)
BUN: 13 mg/dL (ref 6–24)
CO2: 25 mmol/L (ref 20–29)
Calcium: 9.8 mg/dL (ref 8.7–10.2)
Chloride: 105 mmol/L (ref 96–106)
Creatinine, Ser: 1.28 mg/dL — ABNORMAL HIGH (ref 0.76–1.27)
GFR calc Af Amer: 73 mL/min/{1.73_m2} (ref >59)
GFR calc non Af Amer: 63 mL/min/{1.73_m2} (ref >59)
Glucose: 125 mg/dL — ABNORMAL HIGH (ref 65–99)
Potassium: 4.2 mmol/L (ref 3.5–5.2)
Sodium: 142 mmol/L (ref 134–144)

## 2020-06-01 MED ORDER — ROSUVASTATIN CALCIUM 5 MG PO TABS: 5.0000 mg | ORAL_TABLET | Freq: Every day | ORAL | 3 refills | Status: DC

## 2020-06-08 ENCOUNTER — Ambulatory Visit: Payer: No Typology Code available for payment source | Admitting: Unknown Physician Specialty

## 2020-06-16 ENCOUNTER — Telehealth: Payer: Self-pay

## 2020-06-16 ENCOUNTER — Ambulatory Visit: Payer: No Typology Code available for payment source | Admitting: Nurse Practitioner

## 2020-06-16 ENCOUNTER — Other Ambulatory Visit: Payer: Self-pay

## 2020-06-16 ENCOUNTER — Encounter: Payer: Self-pay | Admitting: Nurse Practitioner

## 2020-06-16 VITALS — BP 118/82 | HR 59 | Temp 98.4°F | Wt 159.0 lb

## 2020-06-16 DIAGNOSIS — W009XXA Unspecified fall due to ice and snow, initial encounter: Secondary | ICD-10-CM | POA: Diagnosis not present

## 2020-06-16 DIAGNOSIS — H6121 Impacted cerumen, right ear: Secondary | ICD-10-CM | POA: Diagnosis not present

## 2020-06-16 DIAGNOSIS — I10 Essential (primary) hypertension: Secondary | ICD-10-CM

## 2020-06-16 DIAGNOSIS — E785 Hyperlipidemia, unspecified: Secondary | ICD-10-CM

## 2020-06-16 MED ORDER — LOSARTAN POTASSIUM 25 MG PO TABS: 25.0000 mg | ORAL_TABLET | Freq: Every day | ORAL | 0 refills | Status: DC

## 2020-06-16 NOTE — Progress Notes (Signed)
Subjective:    Patient ID: Sean Ade., male    DOB: 1965/08/01, 55 y.o.   MRN: [id number]  Chief Complaint  Patient presents with  . Hypertension  . Headache    Patient states headaches are better they seem to be gone   . Fall    Patient states he slipped and fell on ice about two weeks ago. Hit his head and hurt his back. Lower right side of back is still sore as well as his right shoulder.  . Gout    Patient states his right ankle is hurting believes it is a gout flare up     HPI Patient is in today for follow-up on headache, hypertension, and a falling on ice.   HYPERTENSION / HYPERLIPIDEMIA  He was started on losartan and crestor last visit 05/31/20.  Satisfied with current treatment? yes Duration of hypertension: Had off an on BP monitoring frequency: not checking--getting new machine BP medication side effects: no Cholesterol medication side effects: no Cholesterol supplements: none Medication compliance: excellent compliance Aspirin: no Recent stressors: no Recurrent headaches: no Visual changes: no Palpitations: no Dyspnea: no Chest pain: no Lower extremity edema: no Dizzy/lightheaded: no  HEADACHE  Has resolved since starting the losartan. Denies any more numbness and no more difficulty finding words.   FALL  Slipped and fell on the ice 2 weeks ago. He hit his head and right side. Did not go to ER or urgent care. Noticed a scrape on his head. Thought he was going to be sore for a few days. Still having pain in his right side, shoulder and back. Currently 4/10. Hurts more with movement. He soaked it in warm water, used heat, and taking ibuprofen. Helps some but pain is still there. Denies numbness and tingling. He has no bruising noted. Currently has no pain, bruising, or swelling where he hit his head.   Past Medical History:  Diagnosis Date  . Allergic rhinitis   . Dysphagia   . Gout   . History of cellulitis 11/2013 and 8/15  .  Hyperlipidemia   . Hypertension     Past Surgical History:  Procedure Laterality Date  . HIATAL HERNIA REPAIR     x3  . KNEE ARTHROPLASTY    . KNEE SURGERY      Family History  Problem Relation Age of Onset  . Hypertension Father   . Diabetes Father   . Stroke Mother   . Hypertension Paternal Grandmother   . Myasthenia gravis Other        grandfather  . Cancer Maternal Grandmother     Social History   Socioeconomic History  . Marital status: Married    Spouse name: Not on file  . Number of children: Not on file  . Years of education: Not on file  . Highest education level: Not on file  Occupational History  . Not on file  Tobacco Use  . Smoking status: Former Smoker    Packs/day: 1.25    Years: 8.00    Pack years: 10.00    Types: Cigarettes    Quit date: 05/13/1996    Years since quitting: 24.1  . Smokeless tobacco: Never Used  Vaping Use  . Vaping Use: Never used  Substance and Sexual Activity  . Alcohol use: No  . Drug use: No  . Sexual activity: Never    Birth control/protection: None  Other Topics Concern  . Not on file  Social History Narrative  . Not  on file   Social Determinants of Health   Financial Resource Strain: Not on file  Food Insecurity: Not on file  Transportation Needs: Not on file  Physical Activity: Not on file  Stress: Not on file  Social Connections: Not on file  Intimate Partner Violence: Not on file    Outpatient Medications Prior to Visit  Medication Sig Dispense Refill  . colchicine 0.6 MG tablet Take 1 tablet (0.6 mg total) by mouth 2 (two) times daily as needed. 60 tablet 0  . ibuprofen (ADVIL) 600 MG tablet Take 1 tablet (600 mg total) by mouth every 6 (six) hours as needed. 20 tablet 0  . rosuvastatin (CRESTOR) 5 MG tablet Take 1 tablet (5 mg total) by mouth daily. 90 tablet 3  . losartan (COZAAR) 25 MG tablet Take 1 tablet (25 mg total) by mouth daily. 90 tablet 0   No facility-administered medications prior to  visit.    No Known Allergies  Review of Systems  Constitutional: Negative for fatigue and fever.  HENT: Positive for ear pain (feeling like right ear is popping frequently).   Eyes: Negative.   Respiratory: Negative.  Negative for shortness of breath.   Cardiovascular: Negative.  Negative for chest pain.  Gastrointestinal: Negative.   Endocrine: Negative.   Genitourinary: Negative.   Skin: Negative.   Neurological: Negative for dizziness, syncope, speech difficulty, light-headedness and headaches.  Psychiatric/Behavioral: Negative.        Objective:    Physical Exam Vitals and nursing note reviewed.  Constitutional:      Appearance: He is well-developed.  HENT:     Head: Normocephalic and atraumatic. No raccoon eyes, abrasion, contusion or laceration.     Comments: No bruising, abrasions noted from fall    Right Ear: Hearing, ear canal and external ear normal. There is impacted cerumen.     Left Ear: Hearing, tympanic membrane, ear canal and external ear normal.  Neck:     Vascular: No carotid bruit.  Cardiovascular:     Rate and Rhythm: Normal rate and regular rhythm.  Pulmonary:     Effort: Pulmonary effort is normal.     Breath sounds: Normal breath sounds.  Abdominal:     Palpations: Abdomen is soft.  Musculoskeletal:        General: No swelling, tenderness or deformity. Normal range of motion.     Cervical back: Normal range of motion.  Skin:    General: Skin is warm and dry.  Neurological:     General: No focal deficit present.     Mental Status: He is alert and oriented to person, place, and time.     Cranial Nerves: No cranial nerve deficit.     Sensory: No sensory deficit.     Motor: No weakness.     Coordination: Coordination normal.     Gait: Gait normal.  Psychiatric:        Mood and Affect: Mood normal.        Behavior: Behavior normal.     BP 118/82 (BP Location: Left Arm, Patient Position: Sitting)   Pulse (!) 59   Temp 98.4 F (36.9 C)    Wt 159 lb (72.1 kg)   SpO2 99%   BMI 23.48 kg/m  Wt Readings from Last 3 Encounters:  06/16/20 159 lb (72.1 kg)  05/31/20 152 lb (68.9 kg)  02/03/20 155 lb (70.3 kg)    Lab Results  Component Value Date   WBC 7.7 01/24/2020   HGB 15.0 01/24/2020  HCT 43.6 01/24/2020   MCV 82.3 01/24/2020   PLT 578 (H) 01/24/2020   Lab Results  Component Value Date   NA 142 05/31/2020   K 4.2 05/31/2020   CO2 25 05/31/2020   GLUCOSE 125 (H) 05/31/2020   BUN 13 05/31/2020   CREATININE 1.28 (H) 05/31/2020   BILITOT 0.9 01/24/2020   ALKPHOS 155 (H) 01/24/2020   AST 36 01/24/2020   ALT 48 (H) 01/24/2020   PROT 6.8 01/24/2020   ALBUMIN 2.7 (L) 01/24/2020   CALCIUM 9.8 05/31/2020   ANIONGAP 9 01/24/2020   Lab Results  Component Value Date   CHOL 204 (H) 05/31/2020   Lab Results  Component Value Date   HDL 45 05/31/2020   Lab Results  Component Value Date   LDLCALC 137 (H) 05/31/2020   Lab Results  Component Value Date   TRIG 120 05/31/2020   Lab Results  Component Value Date   CHOLHDL 4.5 05/31/2020      Assessment & Plan:   Problem List Items Addressed This Visit      Cardiovascular and Mediastinum   Hypertension - Primary    Blood pressure at goal today. His headaches have resolved since starting the medication. He is in the process of getting a new BP monitor for home. No side effects from losartan. Continue current dose, medication refill sent for 90 days. Check BMP today. Will follow-up in 6 months.       Relevant Medications   losartan (COZAAR) 25 MG tablet   Other Relevant Orders   Basic Metabolic Panel (BMET)     Other   Hyperlipidemia    Tolerating crestor with no side effects. Will follow-up in 6 months.       Relevant Medications   losartan (COZAAR) 25 MG tablet    Other Visit Diagnoses    Fall due to ice or snow, initial encounter       Slipped on ice 2 weeks ago. Will order x-ray of right ribs. Continue ice and heat as needed. Can use over the  counter lidocaine patch to help with pain.    Relevant Orders   DG Ribs Unilateral Right   Impacted cerumen of right ear       Use debrox OTC for 10-15 minutes in right ear for several days. May use bulb syringe with warm water to rinse out ear if needed.       Meds ordered this encounter  Medications  . losartan (COZAAR) 25 MG tablet    Sig: Take 1 tablet (25 mg total) by mouth daily.    Dispense:  90 tablet    Refill:  0   Follow-up in 6 months, or sooner if needed.   Gerre Scull, NP

## 2020-06-16 NOTE — Assessment & Plan Note (Signed)
Blood pressure at goal today. His headaches have resolved since starting the medication. He is in the process of getting a new BP monitor for home. No side effects from losartan. Continue current dose, medication refill sent for 90 days. Check BMP today. Will follow-up in 6 months.

## 2020-06-16 NOTE — Assessment & Plan Note (Signed)
Tolerating crestor with no side effects. Will follow-up in 6 months.

## 2020-06-16 NOTE — Patient Instructions (Signed)
It was great to see you!  You have an order to get an x-ray of your ribs and can go to the [facility] office Monday-Thursday without an appointment. Continue to use ice or heat as needed. You can also use a lidoderm patch over the counter to help with the pain.   Your blood pressure looks great! We will check your labs today and let you know the results in a couple days.   You have a build-up of ear wax in your right ear. You can get some debrox over the counter, put a few drops in your ear and let it sit for 10-15 minutes for a few days.   Let's follow-up in 6 months, sooner if you have concerns.   Take care,  Rodman Pickle, NP

## 2020-06-17 LAB — BASIC METABOLIC PANEL
BUN/Creatinine Ratio: 12 (ref 9–20)
BUN: 13 mg/dL (ref 6–24)
CO2: 24 mmol/L (ref 20–29)
Calcium: 9.6 mg/dL (ref 8.7–10.2)
Chloride: 104 mmol/L (ref 96–106)
Creatinine, Ser: 1.13 mg/dL (ref 0.76–1.27)
GFR calc Af Amer: 85 mL/min/{1.73_m2} (ref >59)
GFR calc non Af Amer: 74 mL/min/{1.73_m2} (ref >59)
Glucose: 65 mg/dL (ref 65–99)
Potassium: 4.6 mmol/L (ref 3.5–5.2)
Sodium: 140 mmol/L (ref 134–144)

## 2020-06-22 NOTE — Telephone Encounter (Signed)
error 

## 2020-06-22 NOTE — Progress Notes (Signed)
I saw and evaluated the above patient on 06/16/20.  The case was discussed on rounds with Lauren McElwee, DNP. I personally reviewed the HPI, PH, FH, SH, ROS and medications. I repeated pertinent portions of the examination and reviewed the relevant imaging and laboratory data. I agree with the findings, assessment and plan as documented.   

## 2020-10-01 ENCOUNTER — Encounter: Payer: Self-pay | Admitting: Emergency Medicine

## 2020-10-01 ENCOUNTER — Emergency Department
Admission: EM | Admit: 2020-10-01 | Discharge: 2020-10-01 | Disposition: A | Discharge status: Home / Self Care | Payer: No Typology Code available for payment source | Attending: Emergency Medicine | Admitting: Emergency Medicine

## 2020-10-01 ENCOUNTER — Emergency Department: Payer: No Typology Code available for payment source

## 2020-10-01 ENCOUNTER — Other Ambulatory Visit: Payer: Self-pay

## 2020-10-01 DIAGNOSIS — M10042 Idiopathic gout, left hand: Secondary | ICD-10-CM | POA: Diagnosis not present

## 2020-10-01 DIAGNOSIS — Z79899 Other long term (current) drug therapy: Secondary | ICD-10-CM | POA: Diagnosis not present

## 2020-10-01 DIAGNOSIS — Z8616 Personal history of COVID-19: Secondary | ICD-10-CM | POA: Diagnosis not present

## 2020-10-01 DIAGNOSIS — Z96659 Presence of unspecified artificial knee joint: Secondary | ICD-10-CM | POA: Insufficient documentation

## 2020-10-01 DIAGNOSIS — Z23 Encounter for immunization: Secondary | ICD-10-CM | POA: Diagnosis not present

## 2020-10-01 DIAGNOSIS — I10 Essential (primary) hypertension: Secondary | ICD-10-CM | POA: Insufficient documentation

## 2020-10-01 DIAGNOSIS — L03012 Cellulitis of left finger: Secondary | ICD-10-CM

## 2020-10-01 DIAGNOSIS — M79642 Pain in left hand: Secondary | ICD-10-CM | POA: Diagnosis present

## 2020-10-01 DIAGNOSIS — Z87891 Personal history of nicotine dependence: Secondary | ICD-10-CM | POA: Insufficient documentation

## 2020-10-01 LAB — CBC WITH DIFFERENTIAL/PLATELET
Abs Immature Granulocytes: 0.03 10*3/uL (ref 0.00–0.07)
Basophils Absolute: 0.1 10*3/uL (ref 0.0–0.1)
Basophils Relative: 1 %
Eosinophils Absolute: 0.1 10*3/uL (ref 0.0–0.5)
Eosinophils Relative: 1 %
HCT: 41.7 % (ref 39.0–52.0)
Hemoglobin: 14.2 g/dL (ref 13.0–17.0)
Immature Granulocytes: 0 %
Lymphocytes Relative: 19 %
Lymphs Abs: 1.9 10*3/uL (ref 0.7–4.0)
MCH: 29.3 pg (ref 26.0–34.0)
MCHC: 34.1 g/dL (ref 30.0–36.0)
MCV: 86 fL (ref 80.0–100.0)
Monocytes Absolute: 0.9 10*3/uL (ref 0.1–1.0)
Monocytes Relative: 8 %
Neutro Abs: 7.1 10*3/uL (ref 1.7–7.7)
Neutrophils Relative %: 71 %
Platelets: 242 10*3/uL (ref 150–400)
RBC: 4.85 MIL/uL (ref 4.22–5.81)
RDW: 12.6 % (ref 11.5–15.5)
WBC: 10.1 10*3/uL (ref 4.0–10.5)
nRBC: 0 % (ref 0.0–0.2)

## 2020-10-01 LAB — URIC ACID: Uric Acid, Serum: 9.3 mg/dL — ABNORMAL HIGH (ref 3.7–8.6)

## 2020-10-01 MED ORDER — CEFAZOLIN SODIUM-DEXTROSE 1-4 GM/50ML-% IV SOLN
1.0000 g | Freq: Once | INTRAVENOUS | Status: DC
  Filled 2020-10-01: qty 50

## 2020-10-01 MED ORDER — DEXAMETHASONE SODIUM PHOSPHATE 10 MG/ML IJ SOLN
10.0000 mg | Freq: Once | INTRAMUSCULAR | Status: AC
  Administered 2020-10-01: 10 mg via INTRAVENOUS
  Filled 2020-10-01: qty 1

## 2020-10-01 MED ORDER — SODIUM CHLORIDE 0.9 % IV SOLN
1.0000 g | Freq: Once | INTRAVENOUS | Status: AC
  Administered 2020-10-01: 1 g via INTRAVENOUS
  Filled 2020-10-01: qty 10

## 2020-10-01 MED ORDER — TETANUS-DIPHTH-ACELL PERTUSSIS 5-2.5-18.5 LF-MCG/0.5 IM SUSY
0.5000 mL | PREFILLED_SYRINGE | Freq: Once | INTRAMUSCULAR | Status: AC
  Administered 2020-10-01: 0.5 mL via INTRAMUSCULAR
  Filled 2020-10-01: qty 0.5

## 2020-10-01 MED ORDER — HYDROCODONE-ACETAMINOPHEN 5-325 MG PO TABS: 1.0000 | ORAL_TABLET | Freq: Three times a day (TID) | ORAL | 0 refills | Status: AC | PRN

## 2020-10-01 MED ORDER — CEPHALEXIN 500 MG PO CAPS: 500.0000 mg | ORAL_CAPSULE | Freq: Four times a day (QID) | ORAL | 0 refills | Status: AC

## 2020-10-01 NOTE — ED Triage Notes (Signed)
Pt reports Thursday his left hand middle finger was red above the knuckle. Pt reports redness went away but then he woke up this am and his finger was red and swollen. Pt denies being bit by something or injuries of any kind to that finger.

## 2020-10-01 NOTE — Discharge Instructions (Signed)
Your x-ray and labs were normal except for elevated uric acid at 9.3.  Take the antibiotic as directed, and the pain medicine as needed.  Continue with daily gout medicine.  You may follow-up with your primary provider or orthopedics for ongoing symptom management.  Return to the ED if necessary.

## 2020-10-01 NOTE — ED Notes (Signed)
X-ray at bedside

## 2020-10-01 NOTE — ED Provider Notes (Signed)
[facility] Emergency Department Provider Note   ____________________________________________   Event Date/Time   First MD Initiated Contact with Patient 10/01/20 [phone]     (approximate)  I have reviewed the triage vital signs and the nursing notes.   HISTORY  Chief Complaint Hand Pain  HPI Sean Delacruz. is a 55 y.o. male with the below medical history, presents himself to the ED for evaluation of swelling and redness to the left hand middle finger.  Patient describes on Thursday, he initially noted redness about the knuckle.  He reports that redness self resolved, but when he woke again this morning, is noted to think it was again red and swollen.  He denies any local trauma to the finger, he denies any insect bite or wounds to the hand.  He denies any interim fever, chills, sweats.   Past Medical History:  Diagnosis Date  . Allergic rhinitis   . Dysphagia   . Gout   . History of cellulitis 11/2013 and 8/15  . Hyperlipidemia   . Hypertension     Patient Active Problem List   Diagnosis Date Noted  . Headache 05/31/2020  . Lab test positive for detection of COVID-19 virus 01/18/2020  . Close exposure to COVID-19 virus 01/14/2020  . Cellulitis of left hand 06/04/2017  . Infrapatellar bursitis of left knee 06/13/2015  . Hx of melanoma of skin 01/31/2015  . Hyperlipidemia   . Hypertension   . Gout   . Dysphagia     Past Surgical History:  Procedure Laterality Date  . HIATAL HERNIA REPAIR     x3  . KNEE ARTHROPLASTY    . KNEE SURGERY      Prior to Admission medications   Medication Sig Start Date End Date Taking? Authorizing Provider  cephALEXin (KEFLEX) 500 MG capsule Take 1 capsule (500 mg total) by mouth 4 (four) times daily for 7 days. 10/01/20 10/08/20 Yes Parisa Pinela, Charlesetta Ivory, PA-C  HYDROcodone-acetaminophen (NORCO) 5-325 MG tablet Take 1 tablet by mouth 3 (three) times daily as needed for up to 3 days. 10/01/20 10/04/20 Yes  Dmitry Macomber, Charlesetta Ivory, PA-C  colchicine 0.6 MG tablet Take 1 tablet (0.6 mg total) by mouth 2 (two) times daily as needed. 12/03/19 12/02/20  Particia Nearing, PA-C  ibuprofen (ADVIL) 600 MG tablet Take 1 tablet (600 mg total) by mouth every 6 (six) hours as needed. 01/25/20   Nita Sickle, MD  losartan (COZAAR) 25 MG tablet Take 1 tablet (25 mg total) by mouth daily. 06/16/20   McElwee, Lauren A, NP  rosuvastatin (CRESTOR) 5 MG tablet Take 1 tablet (5 mg total) by mouth daily. 06/01/20   Caro Laroche, DO    Allergies Patient has no known allergies.  Family History  Problem Relation Age of Onset  . Hypertension Father   . Diabetes Father   . Stroke Mother   . Hypertension Paternal Grandmother   . Myasthenia gravis Other        grandfather  . Cancer Maternal Grandmother     Social History Social History   Tobacco Use  . Smoking status: Former Smoker    Packs/day: 1.25    Years: 8.00    Pack years: 10.00    Types: Cigarettes    Quit date: 05/13/1996    Years since quitting: 24.4  . Smokeless tobacco: Never Used  Vaping Use  . Vaping Use: Never used  Substance Use Topics  . Alcohol use: No  . Drug  use: No    Review of Systems  Constitutional: No fever/chills Eyes: No visual changes. ENT: No sore throat. Cardiovascular: Denies chest pain. Respiratory: Denies shortness of breath. Gastrointestinal: No abdominal pain.  No nausea, no vomiting.  No diarrhea.  No constipation. Genitourinary: Negative for dysuria. Musculoskeletal: Negative for back pain.  Left hand middle finger pain and swelling as above. Skin: Negative for rash. Neurological: Negative for headaches, focal weakness or numbness. ____________________________________________   PHYSICAL EXAM:  VITAL SIGNS: ED Triage Vitals  Enc Vitals Group     BP 10/01/20 1424 (!) 160/95     Pulse Rate 10/01/20 1424 78     Resp 10/01/20 1424 18     Temp 10/01/20 1424 98.3 F (36.8 C)     Temp Source  10/01/20 1424 Oral     SpO2 10/01/20 1424 97 %     Weight 10/01/20 1421 172 lb (78 kg)     Height 10/01/20 1421 5\' 9"  (1.753 m)     Head Circumference --      Peak Flow --      Pain Score 10/01/20 1421 10     Pain Loc --      Pain Edu? --      Excl. in GC? --     Constitutional: Alert and oriented. Well appearing and in no acute distress. Eyes: Conjunctivae are normal. PERRL. EOMI. Head: Atraumatic. Neck: No stridor.   Cardiovascular: Normal rate, regular rhythm. Grossly normal heart sounds.  Good peripheral circulation. Respiratory: Normal respiratory effort.  No retractions. Lungs CTAB. Musculoskeletal: Left hand without obvious deformity or dislocation.  Patient is noted to have significant soft tissue swelling from the dorsal PIP to DIP exclusively.  There is some joint deformity appreciated, but no obvious wound or fluctuance.  Patient is able demonstrate normal flexion and extension range. No pain with passive extension on exam.  no lower extremity tenderness nor edema.  No joint effusions. Neurologic:  Normal speech and language. No gross focal neurologic deficits are appreciated. No gait instability. Skin:  Skin is warm, dry and intact. No rash noted. Psychiatric: Mood and affect are normal. Speech and behavior are normal.  ____________________________________________   LABS (all labs ordered are listed, but only abnormal results are displayed)  Labs Reviewed  URIC ACID - Abnormal; Notable for the following components:      Result Value   Uric Acid, Serum 9.3 (*)    All other components within normal limits  CBC WITH DIFFERENTIAL/PLATELET   ____________________________________________  EKG   ____________________________________________  RADIOLOGY I, 10/03/20, personally viewed and evaluated these images (plain radiographs) as part of my medical decision making, as well as reviewing the written report by the radiologist.  ED MD interpretation:  Agree  with report  Official radiology report(s): DG Finger Middle Left  Result Date: 10/01/2020 CLINICAL DATA:  Left middle finger pain and swelling. EXAM: LEFT MIDDLE FINGER 2+V COMPARISON:  None. FINDINGS: There is no evidence of fracture or dislocation. There is no evidence of arthropathy or other focal bone abnormality. Soft tissues are unremarkable. IMPRESSION: Negative. Electronically Signed   By: 10/03/2020 M.D.   On: 10/01/2020 15:54  ____________________________________________   PROCEDURES  Procedure(s) performed (including Critical Care):  Procedures  Ancef 1 g IVPB Decadron 10 mg IVP Tdap 0.5 mL IM ____________________________________________   INITIAL IMPRESSION / ASSESSMENT AND PLAN / ED COURSE  As part of my medical decision making, I reviewed the following data within the electronic medical  record:  Radiograph reviewed as above and Notes from prior ED visits  DDX: cellulitis, gout flare, flexor tenosynovitis   Patient ED evaluation of spontaneous redness and swelling to the left dorsal middle finger without preceding injury or trauma.  Patient was evaluated for his complaints.  The DDx included a possible cellulitis as well as a gout flare given his history.  Patient x-rays negative for any acute fracture, dislocation, or bony erosion.  His labs are otherwise reassuring with exception of an elevated uric acid.  His clinical picture likely represents an acute gouty arthritis, but he will be treated empirically with cephalexin at this time for possible nonpurulent cellulitis.  Patient was given strict return precautions and will continue to dose his home colchicine.  Return precautions have been reviewed. ____________________________________________   FINAL CLINICAL IMPRESSION(S) / ED DIAGNOSES  Final diagnoses:  Acute idiopathic gout of left hand  Cellulitis of finger of left hand     ED Discharge Orders         Ordered    HYDROcodone-acetaminophen (NORCO) 5-325  MG tablet  3 times daily PRN        10/01/20 1700    cephALEXin (KEFLEX) 500 MG capsule  4 times daily        10/01/20 1700          *Please note:  Sean Kall. was evaluated in Emergency Department on 10/02/2020 for the symptoms described in the history of present illness. He was evaluated in the context of the global COVID-19 pandemic, which necessitated consideration that the patient might be at risk for infection with the SARS-CoV-2 virus that causes COVID-19. Institutional protocols and algorithms that pertain to the evaluation of patients at risk for COVID-19 are in a state of rapid change based on information released by regulatory bodies including the CDC and federal and state organizations. These policies and algorithms were followed during the patient's care in the ED.  Some ED evaluations and interventions may be delayed as a result of limited staffing during and the pandemic.*   Note:  This document was prepared using Dragon voice recognition software and may include unintentional dictation errors.    Lissa Hoard, PA-C 10/02/20 0024    Phineas Semen, MD 10/09/20 [phone]

## 2020-10-01 NOTE — ED Notes (Signed)
Pt given remote.  

## 2020-10-01 NOTE — ED Notes (Signed)
Pt has been provided with discharge instructions. Pt denies any questions or concerns at this time. Pt verbalizes understanding for follow up care and d/c.  VSS.  Pt left department with all belongings.  

## 2020-11-09 ENCOUNTER — Emergency Department: Payer: No Typology Code available for payment source

## 2020-11-09 ENCOUNTER — Other Ambulatory Visit: Payer: Self-pay

## 2020-11-09 ENCOUNTER — Emergency Department
Admission: EM | Admit: 2020-11-09 | Discharge: 2020-11-09 | Disposition: A | Discharge status: Home / Self Care | Payer: No Typology Code available for payment source | Attending: Emergency Medicine | Admitting: Emergency Medicine

## 2020-11-09 ENCOUNTER — Encounter: Payer: Self-pay | Admitting: *Deleted

## 2020-11-09 DIAGNOSIS — Z5321 Procedure and treatment not carried out due to patient leaving prior to being seen by health care provider: Secondary | ICD-10-CM | POA: Insufficient documentation

## 2020-11-09 DIAGNOSIS — R519 Headache, unspecified: Secondary | ICD-10-CM | POA: Insufficient documentation

## 2020-11-09 DIAGNOSIS — R202 Paresthesia of skin: Secondary | ICD-10-CM | POA: Diagnosis present

## 2020-11-09 LAB — BASIC METABOLIC PANEL
Anion gap: 6 (ref 5–15)
BUN: 14 mg/dL (ref 6–20)
CO2: 26 mmol/L (ref 22–32)
Calcium: 9.2 mg/dL (ref 8.9–10.3)
Chloride: 106 mmol/L (ref 98–111)
Creatinine, Ser: 1.34 mg/dL — ABNORMAL HIGH (ref 0.61–1.24)
GFR, Estimated: >60 mL/min (ref >60)
Glucose, Bld: 119 mg/dL — ABNORMAL HIGH (ref 70–99)
Potassium: 4.3 mmol/L (ref 3.5–5.1)
Sodium: 138 mmol/L (ref 135–145)

## 2020-11-09 LAB — CBC
HCT: 43.5 % (ref 39.0–52.0)
Hemoglobin: 15 g/dL (ref 13.0–17.0)
MCH: 29.5 pg (ref 26.0–34.0)
MCHC: 34.5 g/dL (ref 30.0–36.0)
MCV: 85.5 fL (ref 80.0–100.0)
Platelets: 277 10*3/uL (ref 150–400)
RBC: 5.09 MIL/uL (ref 4.22–5.81)
RDW: 12.6 % (ref 11.5–15.5)
WBC: 7.5 10*3/uL (ref 4.0–10.5)
nRBC: 0 % (ref 0.0–0.2)

## 2020-11-09 LAB — TROPONIN I (HIGH SENSITIVITY): Troponin I (High Sensitivity): 7 ng/L (ref <18)

## 2020-11-09 NOTE — ED Triage Notes (Signed)
Pt ambulatory to triage.  Pt had right arm numbness at 2030 tonight and tongue and lip numbness also. No numbess now.  Pt has pain in back of head.  Took motrin without relief.  No n/v/  no chest pain or sob.  Pt alert speech clear.

## 2020-11-10 ENCOUNTER — Ambulatory Visit: Payer: Self-pay | Admitting: *Deleted

## 2020-11-10 NOTE — Telephone Encounter (Signed)
FYI pt scheduled 7/5

## 2020-11-10 NOTE — Telephone Encounter (Signed)
Patient states his wife saw CT scan on MyChart- and was concerned regarding the finding: IMPRESSION: 1. No acute intracranial abnormality. 2. Small area of chronic appearing left periventricular white matter low attenuation which may represent a small amount of chronic white matter ischemia. Correlation with nonemergent MRI is recommended.  Advised if acute finding- he would not have been released- please discuss with PCP at appointment on Tuesday- she may want to order further testing. Patient states he will do that. Reason for Disposition  [1] Caller requesting NON-URGENT health information AND [2] PCP's office is the best resource  Answer Assessment - Initial Assessment Questions 1. REASON FOR CALL or QUESTION: "What is your reason for calling today?" or "How can I best help you?" or "What question do you have that I can help answer?"     Concerned regarding CT scan result and need for follow up/treatment  Protocols used: Information Only Call - No Triage-A-AH

## 2020-11-10 NOTE — Telephone Encounter (Signed)
Summary: Lab results   Patient was seen in the emergency department on 11/09/2020 for hand and arm numbness. Patient viewed lab results and does not understand the results. Patient states he feels fine. Scheduled hospital follow up with PCP on 11/14/2020 but would like to speak with nurse today regarding results.    Attempted to call patient - left message to call office

## 2020-11-14 ENCOUNTER — Encounter: Payer: Self-pay | Admitting: Internal Medicine

## 2020-11-14 ENCOUNTER — Other Ambulatory Visit: Payer: Self-pay

## 2020-11-14 ENCOUNTER — Ambulatory Visit: Payer: No Typology Code available for payment source | Admitting: Internal Medicine

## 2020-11-14 VITALS — BP 128/85 | HR 57 | Temp 98.7°F | Ht 67.76 in | Wt 156.0 lb

## 2020-11-14 DIAGNOSIS — R930 Abnormal findings on diagnostic imaging of skull and head, not elsewhere classified: Secondary | ICD-10-CM

## 2020-11-14 DIAGNOSIS — M109 Gout, unspecified: Secondary | ICD-10-CM

## 2020-11-14 DIAGNOSIS — R200 Anesthesia of skin: Secondary | ICD-10-CM

## 2020-11-14 DIAGNOSIS — R202 Paresthesia of skin: Secondary | ICD-10-CM | POA: Diagnosis not present

## 2020-11-14 MED ORDER — ASPIRIN 81 MG PO TBEC: 81.0000 mg | DELAYED_RELEASE_TABLET | Freq: Every day | ORAL | 12 refills | Status: DC

## 2020-11-14 NOTE — Progress Notes (Signed)
BP 128/85   Pulse (!) 57   Temp 98.7 F (37.1 C) (Oral)   Ht 5' 7.76" (1.721 m)   Wt 156 lb (70.8 kg)   SpO2 98%   BMI 23.89 kg/m    Subjective:    Patient ID: Sean Ade., male    DOB: 17-Jul-1965, 55 y.o.   MRN: [id number]  Chief Complaint  Patient presents with  . Hospitalization Follow-up    Right arm and hand numbness. Has since resolved  . Hypertension  . Gout    HPI: Sean Delacruz. is a 55 y.o. male  Pt is here for a fu , he was in the ER last Thursday he says he had right arm numbness and then the whole arm went numb and felt " weird " by the time he got to the ER the feeling was gone. He said the feeling lasted x 20 mins. At the hospital   Pt has a ho Htn.  Has had GOUT for years now is on colchicine,   Hypertension This is a chronic (is on losartan 25 mg daily) problem.   Chief Complaint  Patient presents with  . Hospitalization Follow-up    Right arm and hand numbness. Has since resolved  . Hypertension  . Gout    Relevant past medical, surgical, family and social history reviewed and updated as indicated. Interim medical history since our last visit reviewed. Allergies and medications reviewed and updated.  Review of Systems  Per HPI unless specifically indicated above     Objective:    BP 128/85   Pulse (!) 57   Temp 98.7 F (37.1 C) (Oral)   Ht 5' 7.76" (1.721 m)   Wt 156 lb (70.8 kg)   SpO2 98%   BMI 23.89 kg/m   Wt Readings from Last 3 Encounters:  11/14/20 156 lb (70.8 kg)  11/09/20 163 lb (73.9 kg)  10/01/20 172 lb (78 kg)    Physical Exam Vitals and nursing note reviewed.  Constitutional:      General: He is not in acute distress.    Appearance: Normal appearance. He is not ill-appearing or diaphoretic.  HENT:     Head: Normocephalic and atraumatic.     Right Ear: Tympanic membrane and external ear normal. There is no impacted cerumen.     Left Ear: External ear normal.     Nose: No congestion or rhinorrhea.      Mouth/Throat:     Pharynx: No oropharyngeal exudate or posterior oropharyngeal erythema.  Eyes:     Conjunctiva/sclera: Conjunctivae normal.     Pupils: Pupils are equal, round, and reactive to light.  Cardiovascular:     Rate and Rhythm: Normal rate and regular rhythm.     Heart sounds: No murmur heard.   No friction rub. No gallop.  Pulmonary:     Effort: No respiratory distress.     Breath sounds: No stridor. No wheezing or rhonchi.  Chest:     Chest wall: No tenderness.  Abdominal:     General: Abdomen is flat. Bowel sounds are normal.     Palpations: There is no mass.     Tenderness: There is no abdominal tenderness.  Musculoskeletal:     Cervical back: Normal range of motion and neck supple. No rigidity or tenderness.     Left lower leg: No edema.  Neurological:     Mental Status: He is alert.     Cranial Nerves: No cranial nerve deficit.  Sensory: No sensory deficit.     Motor: No weakness.     Coordination: Coordination normal.     Gait: Gait normal.  Psychiatric:        Mood and Affect: Mood normal.        Behavior: Behavior normal.        Thought Content: Thought content normal.   Results for orders placed or performed during the hospital encounter of 11/09/20  Basic metabolic panel  Result Value Ref Range   Sodium 138 135 - 145 mmol/L   Potassium 4.3 3.5 - 5.1 mmol/L   Chloride 106 98 - 111 mmol/L   CO2 26 22 - 32 mmol/L   Glucose, Bld 119 (H) 70 - 99 mg/dL   BUN 14 6 - 20 mg/dL   Creatinine, Ser 3.22 (H) 0.61 - 1.24 mg/dL   Calcium 9.2 8.9 - 02.5 mg/dL   GFR, Estimated >42 >70 mL/min   Anion gap 6 5 - 15  CBC  Result Value Ref Range   WBC 7.5 4.0 - 10.5 K/uL   RBC 5.09 4.22 - 5.81 MIL/uL   Hemoglobin 15.0 13.0 - 17.0 g/dL   HCT 62.3 76.2 - 83.1 %   MCV 85.5 80.0 - 100.0 fL   MCH 29.5 26.0 - 34.0 pg   MCHC 34.5 30.0 - 36.0 g/dL   RDW 51.7 61.6 - 07.3 %   Platelets 277 150 - 400 K/uL   nRBC 0.0 0.0 - 0.2 %  Troponin I (High Sensitivity)   Result Value Ref Range   Troponin I (High Sensitivity) 7 <18 ng/L        Current Outpatient Medications:  .  colchicine 0.6 MG tablet, Take 1 tablet (0.6 mg total) by mouth 2 (two) times daily as needed., Disp: 60 tablet, Rfl: 0 .  ibuprofen (ADVIL) 600 MG tablet, Take 1 tablet (600 mg total) by mouth every 6 (six) hours as needed., Disp: 20 tablet, Rfl: 0 .  losartan (COZAAR) 25 MG tablet, Take 1 tablet (25 mg total) by mouth daily., Disp: 90 tablet, Rfl: 0 .  rosuvastatin (CRESTOR) 5 MG tablet, Take 1 tablet (5 mg total) by mouth daily., Disp: 90 tablet, Rfl: 3    Assessment & Plan:  TIA :  Will start pt on 81 mg asa Will refer to neurology  Will get MRI head  Fu with me x 1 week  ? Needs US carotids/ ECHO    CT head :  1. No acute intracranial abnormality. 2. Small area of chronic appearing left periventricular white matter low attenuation which may represent a small amount of chronic white matter ischemia. Correlation with nonemergent MRI is recommended  EXAM: CHEST - 2 VIEW   FINDINGS: The heart size and mediastinal contours are within normal limits. Both lungs are clear. Radiopaque surgical clips are seen within the left upper quadrant. There is mild dextroscoliosis of the mid to lower thoracic spine.   IMPRESSION: No active cardiopulmonary disease.   GOUT : not acutely inflammed , last one was 6 months ago. will check URIC acid levels today - Consider uloric for prevention of such given Cr high   Elevated Creatinine :  Recheck labs today    Problem List Items Addressed This Visit   None    Orders Placed This Encounter  Procedures  . MR Brain W Wo Contrast  . Uric acid  . COMPLETE METABOLIC PANEL WITH GFR  . Comp. Metabolic Panel (12)  . Ambulatory referral to Neurology  No orders of the defined types were placed in this encounter.    Follow up plan: No follow-ups on file.

## 2020-11-14 NOTE — Addendum Note (Signed)
Addended byMortimer Fries on: 11/14/2020 11:30 AM   Modules accepted: Orders

## 2020-11-14 NOTE — Patient Instructions (Signed)
Magnetic Resonance Imaging Magnetic resonance imaging (MRI) is a painless test that produces detailed images of organs and tissues inside the body without using X-rays. During an MRI, strong magnets and radio waves work together to form images. MRI images may provide more details about a medical condition than X-rays, CT scans, andultrasounds can provide. For a standard MRI, you will lie on a table that slides into a tunnel. In an open MRI, the tunnel will be open at the sides. In some cases, dye (contrast material) may be injected into your bloodstream to make the MRI images even clearer. Tell a health care provider about: Any allergies you have. All medicines you are taking, including vitamins, herbs, eye drops, creams, and over-the-counter medicines. Any surgeries you have had. Any medical conditions you have. Any metal you may have in your body. The magnets used in an MRI can cause metal objects in your body to move. Metal can also make it difficult to get clear images. Objects that may contain metal include: Any joint replacement (prosthesis), such as an artificial knee or hip. An implanted defibrillator, pacemaker, or neurostimulator. A metallic ear implant (cochlear implant). An artificial heart valve. A metallic object in the eye. Metal splinters. Bullet fragments. A port for delivering insulin or chemotherapy. Any tattoos you have. Some of the darker inks can cause problems with testing. Whether you are using a birth control implant such as an intrauterine device (IUD). Whether you are pregnant, may be pregnant, or are breastfeeding. Any fear of cramped spaces (claustrophobia). If this is a problem, it usually can be managed with medicines given prior to the MRI. What are the risks? Generally, this is a safe test. However, problems may occur, such as: If you have metal in your body and it is close to the area being tested, it may be hard to get high-quality images. If you are  pregnant, you should avoid MRI tests during the first three months of pregnancy. An MRI may affect an unborn baby. If dye is used: You may need to stop breastfeeding until the dye leaves your body naturally, if this applies. There is a risk of an allergic reaction to the dye. You can take medicines to prevent this reaction or to treat it if you have allergy symptoms. The dye can cause damage to your kidneys. Drinking plenty of water before and after the procedure can help prevent this problem. What happens before the procedure? You will be asked to remove all metal, including: A watch, jewelry (including jewelry in piercings), and other metal objects. Hearing aids. Dentures. An underwire bra. Makeup. Some makeup contains small amounts of metal. Braces and fillings are normally not a problem. If you are breastfeeding, ask your health care provider if you need to pump before your test. You may need to stop breastfeeding temporarily if dye will be used. What happens during the procedure?  You may be given earplugs or headphones to listen to music. The MRI machine can be noisy. You will lie flat on your back on a long table. If dye will be used, an IV will be inserted into one of your veins. Dye will be injected into your IV and travel through your bloodstream. The table will slide into a tunnel that has magnets inside. When you are inside the tunnel, you will still be able to talk to your health care provider. You will be asked to lie very still while images are taken. Your health care provider will tell you when you  can move. You may have to wait a few minutes to make sure that the images produced during the test are clear. When all images are produced, the table will slide out of the tunnel. The procedure can last from 30 minutes to over an hour. The procedure may vary among health care providers and hospitals. What can I expect after the procedure? You may be taken to a recovery area if  sedation medicines were used. Your blood pressure, heart rate, breathing rate, and blood oxygen level will be monitored until you leave the hospital or clinic. If dye was used: It will leave your body through your urine within a day. You may be told to drink plenty of fluids to help flush the dye out of your system. Do not breastfeed your child until your health care provider says that this is safe. Follow these instructions at home: You may return to your normal activities right away, or as told by your health care provider. It is up to you to get your test results. Ask your health care provider, or the department that is doing the test, when your results will be ready. Keep all follow-up visits. This is important. Talk with your health care provider about what your test results mean. Summary Magnetic resonance imaging (MRI) is a painless test that produces detailed pictures of the inside of your body without using X-rays. Strong magnets and radio waves work together to form very detailed and clear images. In some cases, dye (contrast material) may be injected into your body to make MRI images even clearer. Before your MRI, be sure to tell your health care provider about any metal you may have in your body. Talk with your health care provider about what your test results mean. This information is not intended to replace advice given to you by your health care provider. Make sure you discuss any questions you have with your healthcare provider. Document Revised: 09/01/2019 Document Reviewed: 09/01/2019 Elsevier Patient Education  2022 [employer].

## 2020-11-15 ENCOUNTER — Encounter: Payer: Self-pay | Admitting: Internal Medicine

## 2020-11-15 ENCOUNTER — Telehealth (INDEPENDENT_AMBULATORY_CARE_PROVIDER_SITE_OTHER): Payer: No Typology Code available for payment source | Admitting: Internal Medicine

## 2020-11-15 ENCOUNTER — Telehealth: Payer: Self-pay

## 2020-11-15 DIAGNOSIS — M109 Gout, unspecified: Secondary | ICD-10-CM | POA: Diagnosis not present

## 2020-11-15 LAB — COMP. METABOLIC PANEL (14)
ALT: 13 IU/L (ref 0–44)
AST: 15 IU/L (ref 0–40)
Albumin/Globulin Ratio: 1.4 (ref 1.2–2.2)
Albumin: 4.2 g/dL (ref 3.8–4.9)
Alkaline Phosphatase: 92 IU/L (ref 44–121)
BUN/Creatinine Ratio: 8 — ABNORMAL LOW (ref 9–20)
BUN: 10 mg/dL (ref 6–24)
Bilirubin Total: 0.3 mg/dL (ref 0.0–1.2)
CO2: 24 mmol/L (ref 20–29)
Calcium: 9.4 mg/dL (ref 8.7–10.2)
Chloride: 105 mmol/L (ref 96–106)
Creatinine, Ser: 1.31 mg/dL — ABNORMAL HIGH (ref 0.76–1.27)
Globulin, Total: 2.9 g/dL (ref 1.5–4.5)
Glucose: 76 mg/dL (ref 65–99)
Potassium: 4 mmol/L (ref 3.5–5.2)
Sodium: 142 mmol/L (ref 134–144)
Total Protein: 7.1 g/dL (ref 6.0–8.5)
eGFR: 65 mL/min/{1.73_m2} (ref >59)

## 2020-11-15 LAB — URIC ACID: Uric Acid: 9.7 mg/dL — ABNORMAL HIGH (ref 3.8–8.4)

## 2020-11-15 MED ORDER — METHYLPREDNISOLONE 4 MG PO TBPK: ORAL_TABLET | ORAL | 0 refills | Status: DC

## 2020-11-15 NOTE — Progress Notes (Signed)
There were no vitals taken for this visit.   Subjective:    Patient ID: Sean Delacruz., male    DOB: 07-Aug-1965, 55 y.o.   MRN: [id number]  No chief complaint on file.   HPI: Sean Delacruz. is a 55 y.o. male   This visit was completed via telephone due to the restrictions of the COVID-19 pandemic. All issues as above were discussed and addressed but no physical exam was performed. If it was felt that the patient should be evaluated in the office, they were directed there. The patient verbally consented to this visit. Patient was unable to complete an audio/visual visit due to Technical difficulties. Due to the catastrophic nature of the COVID-19 pandemic, this visit was done through audio contact only. Location of the patient: home Location of the provider: work Those involved with this call:  Provider: Charlynne Cousins, MD CMA: Frazier Butt, [place] Desk/Registration: Roe Rutherford. Time spent on call: 10 minutes on the phone discussing health concerns. 10 minutes total spent in review of patient's record and preparation of their chart.    Arthritis Presents for initial (pt says he had eaten cashews and this caused gout in the past, had a flare up - uric acid levels at 9.7 - half a bag) visit. The disease course has been worsening. He complains of pain and joint swelling. (can walk on it, if he tries to bend it he notices resistance has colchicine at home to restart it.) Affected locations include the left knee. Associated symptoms include pain at night.   No chief complaint on file.   Relevant past medical, surgical, family and social history reviewed and updated as indicated. Interim medical history since our last visit reviewed. Allergies and medications reviewed and updated.  Review of Systems  Musculoskeletal:  Positive for arthralgias, arthritis and joint swelling. Negative for back pain, gait problem, myalgias, neck pain and neck stiffness.   Per HPI unless  specifically indicated above     Objective:    There were no vitals taken for this visit.  Wt Readings from Last 3 Encounters:  11/14/20 156 lb (70.8 kg)  11/09/20 163 lb (73.9 kg)  10/01/20 172 lb (78 kg)    Physical Exam  Unable to peform sec to virtual visit.   Results for orders placed or performed in visit on 11/14/20  Uric acid  Result Value Ref Range   Uric Acid 9.7 (H) 3.8 - 8.4 mg/dL  Comp. Metabolic Panel (14)  Result Value Ref Range   Glucose 76 65 - 99 mg/dL   BUN 10 6 - 24 mg/dL   Creatinine, Ser 1.31 (H) 0.76 - 1.27 mg/dL   eGFR 65 >59 mL/min/1.73   Interpretation: Comment    BUN/Creatinine Ratio 8 (L) 9 - 20   Sodium 142 134 - 144 mmol/L   Potassium 4.0 3.5 - 5.2 mmol/L   Chloride 105 96 - 106 mmol/L   CO2 24 20 - 29 mmol/L   Calcium 9.4 8.7 - 10.2 mg/dL   Total Protein 7.1 6.0 - 8.5 g/dL   Albumin 4.2 3.8 - 4.9 g/dL   Globulin, Total 2.9 1.5 - 4.5 g/dL   Albumin/Globulin Ratio 1.4 1.2 - 2.2   Bilirubin Total 0.3 0.0 - 1.2 mg/dL   Alkaline Phosphatase 92 44 - 121 IU/L   AST 15 0 - 40 IU/L   ALT 13 0 - 44 IU/L        Current Outpatient Medications:  aspirin 81 MG EC tablet, Take 1 tablet (81 mg total) by mouth daily. Swallow whole., Disp: 30 tablet, Rfl: 12   colchicine 0.6 MG tablet, Take 1 tablet (0.6 mg total) by mouth 2 (two) times daily as needed., Disp: 60 tablet, Rfl: 0   ibuprofen (ADVIL) 600 MG tablet, Take 1 tablet (600 mg total) by mouth every 6 (six) hours as needed., Disp: 20 tablet, Rfl: 0   losartan (COZAAR) 25 MG tablet, Take 1 tablet (25 mg total) by mouth daily., Disp: 90 tablet, Rfl: 0   rosuvastatin (CRESTOR) 5 MG tablet, Take 1 tablet (5 mg total) by mouth daily., Disp: 90 tablet, Rfl: 3    Assessment & Plan:  Left knee pain:  The nature of gout is fully explained, including dietary relationship, acute and interval phase and treatment of both. Long term complications such as kidney stones, tophi and arthritis are discussed.  Avoidance of alcohol recommended  Indications for the use of allopurinol for prophylaxis and the use of colchicine to prevent or treat flare-ups is also discussed. Proper use of  for acute attacks discussed, and its side effects. Call if further attacks occur, or this one does not resolve promptly.  Cr high at 1.31 , start pt on medrol dose pak  Will need to fu with me x 2 weeks with recheck of labs.   Meds ordered this encounter  Medications   methylPREDNISolone (MEDROL DOSEPAK) 4 MG TBPK tablet    Sig: Use as directed    Dispense:  1 each    Refill:  0     Problem List Items Addressed This Visit   None   No orders of the defined types were placed in this encounter.    No orders of the defined types were placed in this encounter.    Follow up plan: No follow-ups on file.

## 2020-11-15 NOTE — Telephone Encounter (Signed)
Called pt to collect co pay no answer left vm  

## 2020-11-22 ENCOUNTER — Encounter: Payer: Self-pay | Admitting: Internal Medicine

## 2020-11-22 ENCOUNTER — Ambulatory Visit (INDEPENDENT_AMBULATORY_CARE_PROVIDER_SITE_OTHER): Payer: No Typology Code available for payment source | Admitting: Internal Medicine

## 2020-11-22 ENCOUNTER — Other Ambulatory Visit: Payer: Self-pay

## 2020-11-22 VITALS — BP 139/87 | HR 60 | Temp 98.8°F | Ht 67.76 in | Wt 156.4 lb

## 2020-11-22 DIAGNOSIS — G459 Transient cerebral ischemic attack, unspecified: Secondary | ICD-10-CM

## 2020-11-22 DIAGNOSIS — M109 Gout, unspecified: Secondary | ICD-10-CM | POA: Diagnosis not present

## 2020-11-22 NOTE — Patient Instructions (Addendum)
Echocardiogram An echocardiogram is a test that uses sound waves (ultrasound) to produce images of the heart. Images from an echocardiogram can provide important information about: Heart size and shape. The size and thickness and movement of your heart's walls. Heart muscle function and strength. Heart valve function or if you have stenosis. Stenosis is when the heart valves are too narrow. If blood is flowing backward through the heart valves (regurgitation). A tumor or infectious growth around the heart valves. Areas of heart muscle that are not working well because of poor blood flow or injury from a heart attack. Aneurysm detection. An aneurysm is a weak or damaged part of an artery wall. The wall bulges out from the normal force of blood pumping through the body. Tell a health care provider about: Any allergies you have. All medicines you are taking, including vitamins, herbs, eye drops, creams, and over-the-counter medicines. Any blood disorders you have. Any surgeries you have had. Any medical conditions you have. Whether you are pregnant or may be pregnant. What are the risks? Generally, this is a safe test. However, problems may occur, including an allergic reaction to dye (contrast) that may be used during the test. What happens before the test? No specific preparation is needed. You may eat and drink normally. What happens during the test?  You will take off your clothes from the waist up and put on a hospital gown. Electrodes or electrocardiogram (ECG)patches may be placed on your chest. The electrodes or patches are then connected to a device that monitors your heart rate and rhythm. You will lie down on a table for an ultrasound exam. A gel will be applied to your chest to help sound waves pass through your skin. A handheld device, called a transducer, will be pressed against your chest and moved over your heart. The transducer produces sound waves that travel to your heart  and bounce back (or "echo" back) to the transducer. These sound waves will be captured in real-time and changed into images of your heart that can be viewed on a video monitor. The images will be recorded on a computer and reviewed by your health care provider. You may be asked to change positions or hold your breath for a short time. This makes it easier to get different views or better views of your heart. In some cases, you may receive contrast through an IV in one of your veins. This can improve the quality of the pictures from your heart. The procedure may vary among health care providers and hospitals. What can I expect after the test? You may return to your normal, everyday life, including diet, activities, andmedicines, unless your health care provider tells you not to do that. Follow these instructions at home: It is up to you to get the results of your test. Ask your health care provider, or the department that is doing the test, when your results will be ready. Keep all follow-up visits. This is important. Summary An echocardiogram is a test that uses sound waves (ultrasound) to produce images of the heart. Images from an echocardiogram can provide important information about the size and shape of your heart, heart muscle function, heart valve function, and other possible heart problems. You do not need to do anything to prepare before this test. You may eat and drink normally. After the echocardiogram is completed, you may return to your normal, everyday life, unless your health care provider tells you not to do that. This information is not intended  to replace advice given to you by your health care provider. Make sure you discuss any questions you have with your healthcare provider. Document Revised: 12/21/2019 Document Reviewed: 12/21/2019 Elsevier Patient Education  2022 [employer] Transient Ischemic Attack A transient ischemic attack (TIA) causes the same symptoms as a stroke,  but the symptoms go away quickly. A TIA happens when blood flow to the brain is blocked. Having a TIA means you may be at risk for a stroke. A TIA is a Architectural technologist. What are the causes? A TIA is caused by a blocked artery in the head or neck. This means the brain does not get the blood supply it needs. A blockage can be caused by: Fatty buildup in an artery in the head or neck. A blood clot. A tear in an artery. Irritation and swelling (inflammation) of an artery. Sometimes the cause is not known. What increases the risk? Certain things may make you more likely to have a TIA. Some of these are things that you can change, such as: Being very overweight. Using products that have nicotine or tobacco. Taking birth control pills. Not being active. Drinking too much alcohol. Using drugs. Health conditions that may increase your risk include: High blood pressure. High cholesterol. Diabetes. Heart disease. A heartbeat that is not regular (atrial fibrillation). Sickle cell disease. Sleep problems (sleep apnea). Long-term diseases that cause irritation and swelling. Problems with blood clotting. Other risk factors include: Being over the age of 10. Being male. Having a family history of stroke. Having had blood clots, stroke, TIA, or heart attack in the past. Having a history of high blood pressure when pregnant (preeclampsia). Very bad headaches (migraines). What are the signs or symptoms? The symptoms of a TIA are like those of a stroke. They can include: Weakness or loss of feeling in your face, arm, or leg. This often happens on one side of your body. Trouble walking. Trouble moving your arms or legs. Trouble talking or understanding what people are saying. Problems with how you see. Feeling dizzy. Feeling confused. Loss of balance or coordination. Feeling like you may vomit (nausea) or you vomit. Having a very bad headache. If you can, note what time you started to have  symptoms. Tell your doctor. How is this treated? The goal of treatment is to lower the risk for a stroke. This may include: Changes to diet and lifestyle, such as getting regular exercise and stopping smoking. Taking medicines to: Thin the blood. Lower blood pressure. Lower cholesterol. Treating other health conditions, such as diabetes. If testing shows that an artery in your brain is narrow, your doctor may recommend a procedure to: Take the blockage out of your artery (carotid endarterectomy). Open or widen an artery in your neck (carotid angioplasty and stenting). Follow these instructions at home: Medicines Take over-the-counter and prescription medicines only as told by your doctor. If you were told to take aspirin or another medicine to thin your blood, take it exactly as told by your doctor. Taking too much of the medicine can cause bleeding. Taking too little of the medicine may not work to treat the problem. Eating and drinking  Eat 5 or more servings of fruits and vegetables each day. Follow instructions from your doctor about your diet. You may need to follow a certain diet to help lower your risk of a stroke. You may need to: Eat a diet that is low in fat and salt. Eat foods with a lot of fiber. Limit carbohydrates and sugar.  If you drink alcohol: Limit how much you have to: 0-1 drink a day for women who are not pregnant. 0-2 drinks a day for men. Know how much alcohol is in a drink. In the [place] one drink equals one 12 oz bottle of beer ( ), one 5 oz glass of wine ( ), or one 1 oz glass of hard liquor (26mL).  General instructions Keep a healthy weight. Try to get at least 30 minutes of exercise on most days. Get treatment if you have sleep problems. Do not smoke or use any products that contain nicotine or tobacco. If you need help quitting, ask your doctor. Do not use drugs. Keep all follow-up visits. Where to find more information American Stroke  Association: www.stroke.org Get help right away if: You have chest pain. You have a heartbeat that is not regular. You have any signs of a stroke. "BE FAST" is an easy way to remember the main warning signs: B - Balance. Dizziness, sudden trouble walking, or loss of balance. E - Eyes. Trouble seeing or a change in how you see. F - Face. Sudden weakness or loss of feeling of the face. The face or eyelid may droop on one side. A - Arms. Weakness or loss of feeling in an arm. This happens all of a sudden and most often on one side of the body. S - Speech. Sudden trouble speaking, slurred speech, or trouble understanding what people say. T - Time. Time to call emergency services. Write down what time symptoms started. You have other signs of a stroke, such as: A sudden, very bad headache with no known cause. Feeling like you may vomit. Vomiting. A seizure. These symptoms may be an emergency. Get help right away. Call your local emergency services (911 in the [place] Do not wait to see if the symptoms will go away. Do not drive yourself to the hospital. Summary A transient ischemic attack (TIA) happens when an artery in the head or neck is blocked. This causes the same symptoms as a stroke. The symptoms go away quickly. A TIA is a medical emergency. Get help right away, even if your symptoms go away. Having a TIA means that you may be at risk for a stroke. Taking medicines and making diet and lifestyle changes can help to prevent a stroke. This information is not intended to replace advice given to you by your health care provider. Make sure you discuss any questions you have with your healthcare provider. Document Revised: 11/23/2019 Document Reviewed: 11/23/2019 Elsevier Patient Education  2022 [employer].

## 2020-11-22 NOTE — Progress Notes (Signed)
Ht 5' 7.76" (1.721 m)   Wt 156 lb 6.4 oz (70.9 kg)   BMI 23.95 kg/m    Subjective:    Patient ID: Sean Ade., male    DOB: Sep 22, 1965, 55 y.o.   MRN: [id number]  Chief Complaint  Patient presents with   Numbness    Patient states that the numbness and tingling has resolved.  Patient has not had MRI, scheduled for 7/19    HPI: Sean Ducat. is a 55 y.o. male  Cr much improved since last viist MRI next week, no more neurological episodes.  No dizzines, no weakenss, no droooping of eyes or drooling from angle of mouth. Labs wnl. Expect uric acid.   Knee Pain  Incident onset: was on medrol dose pak feels better uric acid at 9.7 is on colchicine for such acute attacks, get this 2-3 times a year as long as he watches what he eats.   Chief Complaint  Patient presents with   Numbness    Patient states that the numbness and tingling has resolved.  Patient has not had MRI, scheduled for 7/19    Relevant past medical, surgical, family and social history reviewed and updated as indicated. Interim medical history since our last visit reviewed. Allergies and medications reviewed and updated.  Review of Systems  Per HPI unless specifically indicated above     Objective:    Ht 5' 7.76" (1.721 m)   Wt 156 lb 6.4 oz (70.9 kg)   BMI 23.95 kg/m   Wt Readings from Last 3 Encounters:  11/22/20 156 lb 6.4 oz (70.9 kg)  11/14/20 156 lb (70.8 kg)  11/09/20 163 lb (73.9 kg)    Physical Exam  Results for orders placed or performed in visit on 11/14/20  Uric acid  Result Value Ref Range   Uric Acid 9.7 (H) 3.8 - 8.4 mg/dL  Comp. Metabolic Panel (14)  Result Value Ref Range   Glucose 76 65 - 99 mg/dL   BUN 10 6 - 24 mg/dL   Creatinine, Ser 0.62 (H) 0.76 - 1.27 mg/dL   eGFR 65 >60 MD/QBB/5.89   Interpretation: Comment    BUN/Creatinine Ratio 8 (L) 9 - 20   Sodium 142 134 - 144 mmol/L   Potassium 4.0 3.5 - 5.2 mmol/L   Chloride 105 96 - 106 mmol/L   CO2 24 20 -  29 mmol/L   Calcium 9.4 8.7 - 10.2 mg/dL   Total Protein 7.1 6.0 - 8.5 g/dL   Albumin 4.2 3.8 - 4.9 g/dL   Globulin, Total 2.9 1.5 - 4.5 g/dL   Albumin/Globulin Ratio 1.4 1.2 - 2.2   Bilirubin Total 0.3 0.0 - 1.2 mg/dL   Alkaline Phosphatase 92 44 - 121 IU/L   AST 15 0 - 40 IU/L   ALT 13 0 - 44 IU/L        Current Outpatient Medications:    aspirin 81 MG EC tablet, Take 1 tablet (81 mg total) by mouth daily. Swallow whole., Disp: 30 tablet, Rfl: 12   colchicine 0.6 MG tablet, Take 1 tablet (0.6 mg total) by mouth 2 (two) times daily as needed., Disp: 60 tablet, Rfl: 0   losartan (COZAAR) 25 MG tablet, Take 1 tablet (25 mg total) by mouth daily., Disp: 90 tablet, Rfl: 0   methylPREDNISolone (MEDROL DOSEPAK) 4 MG TBPK tablet, Use as directed, Disp: 1 each, Rfl: 0   rosuvastatin (CRESTOR) 5 MG tablet, Take 1 tablet (5 mg total) by  mouth daily., Disp: 90 tablet, Rfl: 3    Assessment & Plan:  TIA no new sympotmsto see neurology asap MRI in July 19th  Is on asa 81 mg siince last visit.  Crestor as well   Check US carotids / ECHO  Rtc with above  2. Gout :  not acutely inflammed  Start pt on allopurinol   Problem List Items Addressed This Visit   None    Orders Placed This Encounter  Procedures   US Carotid Duplex Bilateral   ECHOCARDIOGRAM COMPLETE     No orders of the defined types were placed in this encounter.    Follow up plan: No follow-ups on file.

## 2020-11-28 ENCOUNTER — Other Ambulatory Visit: Payer: Self-pay

## 2020-11-28 ENCOUNTER — Ambulatory Visit
Admission: RE | Admit: 2020-11-28 | Discharge: 2020-11-28 | Disposition: A | Discharge status: Home / Self Care | Payer: No Typology Code available for payment source | Source: Ambulatory Visit | Attending: Internal Medicine | Admitting: Internal Medicine

## 2020-11-28 DIAGNOSIS — R930 Abnormal findings on diagnostic imaging of skull and head, not elsewhere classified: Secondary | ICD-10-CM

## 2020-11-28 DIAGNOSIS — R202 Paresthesia of skin: Secondary | ICD-10-CM | POA: Insufficient documentation

## 2020-11-28 DIAGNOSIS — R200 Anesthesia of skin: Secondary | ICD-10-CM | POA: Diagnosis present

## 2020-11-28 MED ORDER — GADOBUTROL 1 MMOL/ML IV SOLN
7.0000 mL | Freq: Once | INTRAVENOUS | Status: AC | PRN
  Administered 2020-11-28: 7 mL via INTRAVENOUS

## 2020-11-29 ENCOUNTER — Encounter: Payer: Self-pay | Admitting: Internal Medicine

## 2020-11-29 ENCOUNTER — Ambulatory Visit (INDEPENDENT_AMBULATORY_CARE_PROVIDER_SITE_OTHER): Payer: No Typology Code available for payment source | Admitting: Internal Medicine

## 2020-11-29 VITALS — BP 130/80 | HR 66 | Temp 98.9°F | Ht 67.76 in | Wt 152.4 lb

## 2020-11-29 DIAGNOSIS — I639 Cerebral infarction, unspecified: Secondary | ICD-10-CM | POA: Diagnosis not present

## 2020-11-29 MED ORDER — ROSUVASTATIN CALCIUM 40 MG PO TABS: 40.0000 mg | ORAL_TABLET | Freq: Every day | ORAL | 5 refills | Status: DC

## 2020-11-29 NOTE — Progress Notes (Signed)
BP 130/80   Pulse 66   Temp 98.9 F (37.2 C) (Oral)   Ht 5' 7.76" (1.721 m)   Wt 152 lb 6.4 oz (69.1 kg)   SpO2 98%   BMI 23.34 kg/m    Subjective:    Patient ID: Sean Delacruz., male    DOB: 08-25-1965, 55 y.o.   MRN: [id number]  Chief Complaint  Patient presents with   Transient Ischemic Attack    F/u with Dr. Gwynneth Albright phone call this morning.     HPI: Ben Habermann. is a 55 y.o. male  Mri - Feels well, obtained results of the MRI brain which shows new infarcts. Had tingling numbness in the 2 -3 weeks Rt arm and rt hand upto the cubital fossa. Lasted for a few mins. 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 Denies Palpitations, no dizziness, no headahces or chest pain.   Neurologic Problem The patient's pertinent negatives include no altered mental status, clumsiness, focal sensory loss, focal weakness, loss of balance, memory loss, near-syncope or slurred speech. This is a new problem.   Chief Complaint  Patient presents with   Transient Ischemic Attack    F/u with Dr. Gwynneth Albright phone call this morning.     Relevant past medical, surgical, family and social history reviewed and updated as indicated. Interim medical history since our last visit reviewed. Allergies and medications reviewed and updated.  Review of Systems  Cardiovascular:  Negative for near-syncope.  Neurological:  Negative for focal weakness and loss of balance.  Psychiatric/Behavioral:  Negative for memory loss.    Per HPI unless specifically indicated above     Objective:    BP 130/80   Pulse 66   Temp 98.9 F (37.2 C) (Oral)   Ht 5' 7.76" (1.721 m)   Wt 152 lb 6.4 oz (69.1 kg)   SpO2 98%   BMI 23.34 kg/m   Wt Readings from Last 3 Encounters:  11/29/20 152 lb 6.4 oz (69.1 kg)  11/22/20 156 lb 6.4 oz (70.9 kg)  11/14/20 156 lb (70.8 kg)    Physical Exam Vitals and nursing note reviewed.  Constitutional:      General: He is not in acute  distress.    Appearance: Normal appearance. He is not ill-appearing or diaphoretic.  HENT:     Head: Normocephalic and atraumatic.     Right Ear: Tympanic membrane and external ear normal. There is no impacted cerumen.     Left Ear: External ear normal.     Nose: No congestion or rhinorrhea.     Mouth/Throat:     Pharynx: No oropharyngeal exudate or posterior oropharyngeal erythema.  Eyes:     Conjunctiva/sclera: Conjunctivae normal.     Pupils: Pupils are equal, round, and reactive to light.  Cardiovascular:     Rate and Rhythm: Normal rate and regular rhythm.     Heart sounds: No murmur heard.   No friction rub. No gallop.  Pulmonary:     Effort: No respiratory distress.     Breath sounds: No stridor. No wheezing or rhonchi.  Chest:     Chest wall: No tenderness.  Abdominal:     General: Abdomen is flat. Bowel sounds are normal.     Palpations: Abdomen is soft. There is no mass.     Tenderness: There is no abdominal tenderness.  Musculoskeletal:     Cervical back: Normal range of motion and neck supple. No  rigidity or tenderness.     Left lower leg: No edema.  Skin:    General: Skin is warm and dry.  Neurological:     Mental Status: He is alert.    Results for orders placed or performed in visit on 11/14/20  Uric acid  Result Value Ref Range   Uric Acid 9.7 (H) 3.8 - 8.4 mg/dL  Comp. Metabolic Panel (14)  Result Value Ref Range   Glucose 76 65 - 99 mg/dL   BUN 10 6 - 24 mg/dL   Creatinine, Ser 1.31 (H) 0.76 - 1.27 mg/dL   eGFR 65 >59 mL/min/1.73   Interpretation: Comment    BUN/Creatinine Ratio 8 (L) 9 - 20   Sodium 142 134 - 144 mmol/L   Potassium 4.0 3.5 - 5.2 mmol/L   Chloride 105 96 - 106 mmol/L   CO2 24 20 - 29 mmol/L   Calcium 9.4 8.7 - 10.2 mg/dL   Total Protein 7.1 6.0 - 8.5 g/dL   Albumin 4.2 3.8 - 4.9 g/dL   Globulin, Total 2.9 1.5 - 4.5 g/dL   Albumin/Globulin Ratio 1.4 1.2 - 2.2   Bilirubin Total 0.3 0.0 - 1.2 mg/dL   Alkaline Phosphatase 92 44 -  121 IU/L   AST 15 0 - 40 IU/L   ALT 13 0 - 44 IU/L        Current Outpatient Medications:    aspirin 81 MG EC tablet, Take 1 tablet (81 mg total) by mouth daily. Swallow whole., Disp: 30 tablet, Rfl: 12   colchicine 0.6 MG tablet, Take 1 tablet (0.6 mg total) by mouth 2 (two) times daily as needed., Disp: 60 tablet, Rfl: 0   losartan (COZAAR) 25 MG tablet, Take 1 tablet (25 mg total) by mouth daily., Disp: 90 tablet, Rfl: 0   rosuvastatin (CRESTOR) 5 MG tablet, Take 1 tablet (5 mg total) by mouth daily., Disp: 90 tablet, Rfl: 3    Assessment & Plan:   Lacunar infarct : Stable, is on ASA 81 mg  Is on crestor 5 mg optimize dose to 40 mg sec to new onset of Infarct per MRI  US / ECHO neuro referrals please.  Htn:  strict compliance needed with meds, is on losartan 25 mg. Consider increaseing dose as his Continue current meds.  Medication compliance emphasised. pt advised to keep Bp logs. Pt verbalised understanding of the same. Pt to have a low salt diet . Exercise to reach a goal of at least 150 mins a week.  lifestyle modifications explained and pt understands importance of the above.   Problem List Items Addressed This Visit   None    Orders Placed This Encounter  Procedures   Lipid panel     Meds ordered this encounter  Medications   rosuvastatin (CRESTOR) 40 MG tablet    Sig: Take 1 tablet (40 mg total) by mouth daily.    Dispense:  30 tablet    Refill:  5     Follow up plan: No follow-ups on file.

## 2020-11-30 NOTE — Addendum Note (Signed)
Addended byLoura Pardon on: 11/30/2020 01:40 PM   Modules accepted: Orders

## 2020-12-11 ENCOUNTER — Other Ambulatory Visit: Payer: Self-pay | Admitting: Internal Medicine

## 2020-12-11 MED ORDER — LOSARTAN POTASSIUM 25 MG PO TABS: 25.0000 mg | ORAL_TABLET | Freq: Every day | ORAL | 0 refills | Status: DC

## 2020-12-11 NOTE — Telephone Encounter (Signed)
Requested Prescriptions  Pending Prescriptions Disp Refills  . losartan (COZAAR) 25 MG tablet 90 tablet 0    Sig: Take 1 tablet (25 mg total) by mouth daily.     Cardiovascular:  Angiotensin Receptor Blockers Failed - 12/11/2020  2:46 PM      Failed - Cr in normal range and within 180 days    Creatinine, Ser  Date Value Ref Range Status  11/14/2020 1.31 (H) 0.76 - 1.27 mg/dL Final         Passed - K in normal range and within 180 days    Potassium  Date Value Ref Range Status  11/14/2020 4.0 3.5 - 5.2 mmol/L Final         Passed - Patient is not pregnant      Passed - Last BP in normal range    BP Readings from Last 1 Encounters:  11/29/20 130/80         Passed - Valid encounter within last 6 months    Recent Outpatient Visits          1 week ago Cerebrovascular accident (CVA), unspecified mechanism (HCC)   [facility] Vigg, Avanti, MD   2 weeks ago TIA (transient ischemic attack)   [facility] Vigg, Avanti, MD   3 weeks ago Gout, unspecified cause, unspecified chronicity, unspecified site   [facility] Vigg, Avanti, MD   3 weeks ago Abnormal CT of the head   [facility] Vigg, Avanti, MD   5 months ago Primary hypertension   [facility] McElwee, Jake Church, NP      Future Appointments            In 3 weeks Vigg, Avanti, MD [facility]

## 2020-12-11 NOTE — Telephone Encounter (Signed)
Pt called in to request a refill for his losartan (COZAAR) 25 MG tablet    Pharmacy: [id number] - [place], [place] - [address]  [address], [place] [address]

## 2020-12-14 ENCOUNTER — Ambulatory Visit: Admission: RE | Admit: 2020-12-14 | Payer: No Typology Code available for payment source | Source: Ambulatory Visit

## 2020-12-15 ENCOUNTER — Ambulatory Visit: Payer: No Typology Code available for payment source | Admitting: Internal Medicine

## 2020-12-18 ENCOUNTER — Encounter: Payer: Self-pay | Admitting: Internal Medicine

## 2020-12-18 ENCOUNTER — Other Ambulatory Visit: Payer: Self-pay

## 2020-12-18 ENCOUNTER — Ambulatory Visit: Payer: No Typology Code available for payment source | Admitting: Internal Medicine

## 2020-12-18 VITALS — BP 144/89 | HR 61 | Temp 97.9°F | Ht 67.72 in | Wt 160.0 lb

## 2020-12-18 DIAGNOSIS — M25421 Effusion, right elbow: Secondary | ICD-10-CM | POA: Insufficient documentation

## 2020-12-18 DIAGNOSIS — I639 Cerebral infarction, unspecified: Secondary | ICD-10-CM

## 2020-12-18 DIAGNOSIS — R7989 Other specified abnormal findings of blood chemistry: Secondary | ICD-10-CM | POA: Insufficient documentation

## 2020-12-18 DIAGNOSIS — I6930 Unspecified sequelae of cerebral infarction: Secondary | ICD-10-CM | POA: Insufficient documentation

## 2020-12-18 DIAGNOSIS — Z8673 Personal history of transient ischemic attack (TIA), and cerebral infarction without residual deficits: Secondary | ICD-10-CM | POA: Insufficient documentation

## 2020-12-18 MED ORDER — COLCHICINE 0.6 MG PO TABS: 0.6000 mg | ORAL_TABLET | Freq: Every day | ORAL | 3 refills | Status: DC

## 2020-12-18 NOTE — Progress Notes (Signed)
There were no vitals taken for this visit.   Subjective:    Patient ID: Sean Delacruz., male    DOB: 10-03-1965, 55 y.o.   MRN: [id number]  No chief complaint on file.   HPI: Sean Delacruz. is a 55 y.o. male  Had a TIA asymptomatic, MRI abnl shows chronic lacunar infarcts, is on ASA and lipitor   Arthritis Presents for follow-up (Gout in right elbow better :) visit. He reports no pain, stiffness, joint swelling or joint warmth. His pain is at a severity of 7/10. Pertinent negatives include no diarrhea, dry eyes, dry mouth, dysuria, fatigue, pain at night, pain while resting, rash, Raynaud's syndrome or uveitis.   No chief complaint on file.   Relevant past medical, surgical, family and social history reviewed and updated as indicated. Interim medical history since our last visit reviewed. Allergies and medications reviewed and updated.  Review of Systems  Constitutional:  Negative for fatigue.  Gastrointestinal:  Negative for diarrhea.  Genitourinary:  Negative for dysuria.  Musculoskeletal:  Positive for arthritis. Negative for joint swelling and stiffness.  Skin:  Negative for rash.   Per HPI unless specifically indicated above     Objective:    BP (!) 144/89   Pulse 61   Temp 97.9 F (36.6 C) (Oral)   Ht 5' 7.72" (1.72 m)   Wt 160 lb (72.6 kg)   SpO2 97%   BMI 24.53 kg/m   Wt Readings from Last 3 Encounters:  11/29/20 152 lb 6.4 oz (69.1 kg)  11/22/20 156 lb 6.4 oz (70.9 kg)  11/14/20 156 lb (70.8 kg)    Physical Exam Vitals and nursing note reviewed.  Constitutional:      General: He is not in acute distress.    Appearance: Normal appearance. He is not ill-appearing or diaphoretic.  HENT:     Head: Normocephalic and atraumatic.     Right Ear: Tympanic membrane and external ear normal. There is no impacted cerumen.     Left Ear: External ear normal.     Nose: No congestion or rhinorrhea.     Mouth/Throat:     Pharynx: No oropharyngeal  exudate or posterior oropharyngeal erythema.  Eyes:     Conjunctiva/sclera: Conjunctivae normal.     Pupils: Pupils are equal, round, and reactive to light.  Cardiovascular:     Rate and Rhythm: Normal rate and regular rhythm.     Heart sounds: No murmur heard.   No friction rub. No gallop.  Pulmonary:     Effort: No respiratory distress.     Breath sounds: No stridor. No wheezing or rhonchi.  Chest:     Chest wall: No tenderness.  Abdominal:     General: Abdomen is flat. Bowel sounds are normal.     Palpations: Abdomen is soft. There is no mass.     Tenderness: There is no abdominal tenderness.  Musculoskeletal:     Cervical back: Normal range of motion and neck supple. No rigidity or tenderness.     Left lower leg: No edema.  Skin:    General: Skin is warm and dry.  Neurological:     Mental Status: He is alert.     Cranial Nerves: No cranial nerve deficit.     Sensory: No sensory deficit.     Motor: No weakness.     Coordination: Coordination normal.     Gait: Gait normal.     Deep Tendon Reflexes: Reflexes normal.  Psychiatric:  Thought Content: Thought content normal.    Results for orders placed or performed in visit on 11/14/20  Uric acid  Result Value Ref Range   Uric Acid 9.7 (H) 3.8 - 8.4 mg/dL  Comp. Metabolic Panel (14)  Result Value Ref Range   Glucose 76 65 - 99 mg/dL   BUN 10 6 - 24 mg/dL   Creatinine, Ser 1.31 (H) 0.76 - 1.27 mg/dL   eGFR 65 >59 mL/min/1.73   Interpretation: Comment    BUN/Creatinine Ratio 8 (L) 9 - 20   Sodium 142 134 - 144 mmol/L   Potassium 4.0 3.5 - 5.2 mmol/L   Chloride 105 96 - 106 mmol/L   CO2 24 20 - 29 mmol/L   Calcium 9.4 8.7 - 10.2 mg/dL   Total Protein 7.1 6.0 - 8.5 g/dL   Albumin 4.2 3.8 - 4.9 g/dL   Globulin, Total 2.9 1.5 - 4.5 g/dL   Albumin/Globulin Ratio 1.4 1.2 - 2.2   Bilirubin Total 0.3 0.0 - 1.2 mg/dL   Alkaline Phosphatase 92 44 - 121 IU/L   AST 15 0 - 40 IU/L   ALT 13 0 - 44 IU/L         Current Outpatient Medications:    aspirin 81 MG EC tablet, Take 1 tablet (81 mg total) by mouth daily. Swallow whole., Disp: 30 tablet, Rfl: 12   colchicine 0.6 MG tablet, Take 1 tablet (0.6 mg total) by mouth 2 (two) times daily as needed., Disp: 60 tablet, Rfl: 0   losartan (COZAAR) 25 MG tablet, Take 1 tablet (25 mg total) by mouth daily., Disp: 90 tablet, Rfl: 0   rosuvastatin (CRESTOR) 40 MG tablet, Take 1 tablet (40 mg total) by mouth daily., Disp: 30 tablet, Rfl: 5  MRI head IMPRESSION: No evidence of acute intracranial abnormality.   Moderate chronic small vessel ischemic changes within the cerebral white matter progressed from the brain MRI of 06/10/2013. This includes chronic lacunar infarcts within the left periatrial white matter, which account for the findings described on the head CT of 11/09/2020.   Chronic lacunar infarct within the left basal ganglia, new from the prior MRI.   Chronic lacunar infarct within the dorsal left pons, also new from the prior MRI.   Mild generalized parenchymal atrophy.  Assessment & Plan:  TIA: Stable on ASA Needs to set up appt with Neurology asap.  Pt has been backing and forthing with them sec to scheduling issues.  2. Elevated Creatnine Will recheck  Needs to rehydrate  3. Gout in right elbow better : has had gout for years seen rheum in the past  Has elevated creat hence no NSAIDS  Was on medrol dose pak, worked but swelling isnt better.  Will refer to rheum.  Problem List Items Addressed This Visit   None    No orders of the defined types were placed in this encounter.    Meds ordered this encounter  Medications   colchicine 0.6 MG tablet    Sig: Take 1 tablet (0.6 mg total) by mouth daily.    Dispense:  30 tablet    Refill:  3     Follow up plan: No follow-ups on file.  Health Maintenance : Cscope : 4 yrs ago.   

## 2020-12-19 LAB — COMPREHENSIVE METABOLIC PANEL
ALT: 14 IU/L (ref 0–44)
AST: 19 IU/L (ref 0–40)
Albumin/Globulin Ratio: 1.9 (ref 1.2–2.2)
Albumin: 4.2 g/dL (ref 3.8–4.9)
Alkaline Phosphatase: 88 IU/L (ref 44–121)
BUN/Creatinine Ratio: 8 — ABNORMAL LOW (ref 9–20)
BUN: 9 mg/dL (ref 6–24)
Bilirubin Total: 0.3 mg/dL (ref 0.0–1.2)
CO2: 23 mmol/L (ref 20–29)
Calcium: 9.1 mg/dL (ref 8.7–10.2)
Chloride: 104 mmol/L (ref 96–106)
Creatinine, Ser: 1.12 mg/dL (ref 0.76–1.27)
Globulin, Total: 2.2 g/dL (ref 1.5–4.5)
Glucose: 84 mg/dL (ref 65–99)
Potassium: 4.2 mmol/L (ref 3.5–5.2)
Sodium: 142 mmol/L (ref 134–144)
Total Protein: 6.4 g/dL (ref 6.0–8.5)
eGFR: 78 mL/min/{1.73_m2} (ref >59)

## 2021-01-03 ENCOUNTER — Ambulatory Visit: Payer: No Typology Code available for payment source | Admitting: Internal Medicine

## 2021-07-02 ENCOUNTER — Other Ambulatory Visit: Payer: Self-pay | Admitting: Internal Medicine

## 2021-07-03 NOTE — Telephone Encounter (Signed)
Called pt. Appt made. Requested Prescriptions  Pending Prescriptions Disp Refills   losartan (COZAAR) 25 MG tablet [Pharmacy Med Name: LOSARTAN POTASSIUM 25 MG TAB] 90 tablet 0    Sig: TAKE 1 TABLET (25 MG TOTAL) BY MOUTH DAILY.     Cardiovascular:  Angiotensin Receptor Blockers Failed - 07/02/2021  1:33 AM      Failed - Cr in normal range and within 180 days    Creatinine, Ser  Date Value Ref Range Status  12/18/2020 1.12 0.76 - 1.27 mg/dL Final         Failed - K in normal range and within 180 days    Potassium  Date Value Ref Range Status  12/18/2020 4.2 3.5 - 5.2 mmol/L Final         Failed - Last BP in normal range    BP Readings from Last 1 Encounters:  12/18/20 (!) 144/89         Failed - Valid encounter within last 6 months    Recent Outpatient Visits          6 months ago Elevated serum creatinine   [facility] Vigg, Avanti, MD   7 months ago Cerebrovascular accident (CVA), unspecified mechanism (HCC)   [facility] Vigg, Avanti, MD   7 months ago TIA (transient ischemic attack)   [facility] Vigg, Avanti, MD   7 months ago Gout, unspecified cause, unspecified chronicity, unspecified site   [facility] Vigg, Avanti, MD   7 months ago Abnormal CT of the head   [facility] Vigg, Avanti, MD      Future Appointments            Tomorrow Vigg, Avanti, MD [facility]           Passed - Patient is not pregnant

## 2021-07-04 ENCOUNTER — Ambulatory Visit: Payer: No Typology Code available for payment source | Admitting: Internal Medicine

## 2021-07-04 ENCOUNTER — Encounter: Payer: Self-pay | Admitting: Internal Medicine

## 2021-07-04 ENCOUNTER — Other Ambulatory Visit: Payer: Self-pay

## 2021-07-04 VITALS — BP 130/70 | HR 59 | Temp 98.6°F | Ht 67.72 in | Wt 155.4 lb

## 2021-07-04 DIAGNOSIS — I10 Essential (primary) hypertension: Secondary | ICD-10-CM | POA: Diagnosis not present

## 2021-07-04 DIAGNOSIS — M1A9XX0 Chronic gout, unspecified, without tophus (tophi): Secondary | ICD-10-CM

## 2021-07-04 DIAGNOSIS — E785 Hyperlipidemia, unspecified: Secondary | ICD-10-CM

## 2021-07-04 MED ORDER — ROSUVASTATIN CALCIUM 40 MG PO TABS: 40.0000 mg | ORAL_TABLET | Freq: Every day | ORAL | 5 refills | Status: DC

## 2021-07-04 MED ORDER — COLCHICINE 0.6 MG PO TABS: 0.6000 mg | ORAL_TABLET | Freq: Every day | ORAL | 3 refills | Status: DC

## 2021-07-04 MED ORDER — LOSARTAN POTASSIUM 25 MG PO TABS: 25.0000 mg | ORAL_TABLET | Freq: Every day | ORAL | 0 refills | Status: DC

## 2021-07-04 NOTE — Progress Notes (Signed)
BP 130/70    Pulse (!) 59    Temp 98.6 F (37 C) (Oral)    Ht 5' 7.72" (1.72 m)    Wt 155 lb 6.4 oz (70.5 kg)    SpO2 99%    BMI 23.83 kg/m    Subjective:    Patient ID: Sean Ade., male    DOB: [birth year], 56 y.o.   MRN: [id number]  Chief Complaint  Patient presents with   Medication Refill    HPI: Sean Michon. is a 56 y.o. male  Hypertension This is a chronic problem. The current episode started more than 1 year ago. The problem has been waxing and waning since onset. Pertinent negatives include no chest pain, headaches, palpitations or shortness of breath.  Hyperlipidemia This is a chronic problem. The current episode started more than 1 year ago. The problem is controlled. Pertinent negatives include no chest pain, myalgias or shortness of breath.   Chief Complaint  Patient presents with   Medication Refill    Relevant past medical, surgical, family and social history reviewed and updated as indicated. Interim medical history since our last visit reviewed. Allergies and medications reviewed and updated.  Review of Systems  Constitutional:  Negative for activity change, appetite change, chills, fatigue and fever.  HENT:  Negative for congestion, ear discharge, ear pain and facial swelling.   Eyes:  Negative for pain, discharge and itching.  Respiratory:  Negative for cough, chest tightness, shortness of breath and wheezing.   Cardiovascular:  Negative for chest pain, palpitations and leg swelling.  Gastrointestinal:  Negative for abdominal distention, abdominal pain, blood in stool, constipation, diarrhea, nausea and vomiting.  Endocrine: Negative for cold intolerance, heat intolerance, polydipsia, polyphagia and polyuria.  Genitourinary:  Negative for difficulty urinating, dysuria, flank pain, frequency, hematuria and urgency.  Musculoskeletal:  Negative for arthralgias, gait problem, joint swelling and myalgias.  Skin:  Negative for color change, rash  and wound.  Neurological:  Negative for dizziness, tremors, speech difficulty, weakness, light-headedness, numbness and headaches.  Hematological:  Does not bruise/bleed easily.  Psychiatric/Behavioral:  Negative for agitation, confusion, decreased concentration, sleep disturbance and suicidal ideas.    Per HPI unless specifically indicated above     Objective:    BP 130/70    Pulse (!) 59    Temp 98.6 F (37 C) (Oral)    Ht 5' 7.72" (1.72 m)    Wt 155 lb 6.4 oz (70.5 kg)    SpO2 99%    BMI 23.83 kg/m   Wt Readings from Last 3 Encounters:  07/04/21 155 lb 6.4 oz (70.5 kg)  12/18/20 160 lb (72.6 kg)  11/29/20 152 lb 6.4 oz (69.1 kg)    Physical Exam Vitals and nursing note reviewed.  Constitutional:      General: Sean Delacruz is not in acute distress.    Appearance: Normal appearance. Sean Delacruz is not ill-appearing or diaphoretic.  HENT:     Head: Normocephalic and atraumatic.     Right Ear: Tympanic membrane and external ear normal. There is no impacted cerumen.     Left Ear: External ear normal.     Nose: No congestion or rhinorrhea.     Mouth/Throat:     Pharynx: No oropharyngeal exudate or posterior oropharyngeal erythema.  Eyes:     Conjunctiva/sclera: Conjunctivae normal.     Pupils: Pupils are equal, round, and reactive to light.  Cardiovascular:     Rate and Rhythm: Normal rate and regular rhythm.  Heart sounds: No murmur heard.   No friction rub. No gallop.  Pulmonary:     Effort: No respiratory distress.     Breath sounds: No stridor. No wheezing or rhonchi.  Chest:     Chest wall: No tenderness.  Abdominal:     General: Abdomen is flat. Bowel sounds are normal.     Palpations: Abdomen is soft. There is no mass.     Tenderness: There is no abdominal tenderness.  Musculoskeletal:     Cervical back: Normal range of motion and neck supple. No rigidity or tenderness.     Left lower leg: No edema.  Skin:    General: Skin is warm and dry.  Neurological:     Mental Status: Sean Delacruz  is alert.    Results for orders placed or performed in visit on 12/18/20  Comprehensive metabolic panel  Result Value Ref Range   Glucose 84 65 - 99 mg/dL   BUN 9 6 - 24 mg/dL   Creatinine, Ser 4.13 0.76 - 1.27 mg/dL   eGFR 78 >65 KT/WIB/2.30   BUN/Creatinine Ratio 8 (L) 9 - 20   Sodium 142 134 - 144 mmol/L   Potassium 4.2 3.5 - 5.2 mmol/L   Chloride 104 96 - 106 mmol/L   CO2 23 20 - 29 mmol/L   Calcium 9.1 8.7 - 10.2 mg/dL   Total Protein 6.4 6.0 - 8.5 g/dL   Albumin 4.2 3.8 - 4.9 g/dL   Globulin, Total 2.2 1.5 - 4.5 g/dL   Albumin/Globulin Ratio 1.9 1.2 - 2.2   Bilirubin Total 0.3 0.0 - 1.2 mg/dL   Alkaline Phosphatase 88 44 - 121 IU/L   AST 19 0 - 40 IU/L   ALT 14 0 - 44 IU/L        Current Outpatient Medications:    aspirin 81 MG EC tablet, Take 1 tablet (81 mg total) by mouth daily. Swallow whole., Disp: 30 tablet, Rfl: 12   colchicine 0.6 MG tablet, Take 1 tablet (0.6 mg total) by mouth daily., Disp: 30 tablet, Rfl: 3   losartan (COZAAR) 25 MG tablet, Take 1 tablet (25 mg total) by mouth daily., Disp: 90 tablet, Rfl: 0   rosuvastatin (CRESTOR) 40 MG tablet, Take 1 tablet (40 mg total) by mouth daily., Disp: 30 tablet, Rfl: 5    Assessment & Plan:  HTN Is on losartan 25 mg for such  Continue current meds.  Medication compliance emphasised. pt advised to keep Bp logs. Pt verbalised understanding of the same. Pt to have a low salt diet . Exercise to reach a goal of at least 150 mins a week.  lifestyle modifications explained and pt understands importance of the above. Under good control on current regimen. Continue current regimen. Continue to monitor. Call with any concerns. Refills given. Labs drawn today.   2. HLD is on crestor  recheck FLP, check LFT's work on diet, SE of meds explained to pt. low fat and high fiber diet explained to pt.   3 . Gout in ankles/ fingers and elbows not acutely inflammed  Is on colchicine will check URIC acid levels today - Consider  allopurinol for prevention of such   Problem List Items Addressed This Visit       Cardiovascular and Mediastinum   Hypertension   Relevant Medications   losartan (COZAAR) 25 MG tablet   rosuvastatin (CRESTOR) 40 MG tablet   Other Relevant Orders   CBC with Differential/Platelet   Comprehensive metabolic panel  PSA   TSH   Uric acid     Other   Hyperlipidemia - Primary   Relevant Medications   losartan (COZAAR) 25 MG tablet   rosuvastatin (CRESTOR) 40 MG tablet   Other Relevant Orders   CBC with Differential/Platelet   Comprehensive metabolic panel   PSA   TSH   Uric acid   Gout   Relevant Orders   CBC with Differential/Platelet   Comprehensive metabolic panel   PSA   TSH   Uric acid     Orders Placed This Encounter  Procedures   CBC with Differential/Platelet   Comprehensive metabolic panel   PSA   TSH   Uric acid     Meds ordered this encounter  Medications   losartan (COZAAR) 25 MG tablet    Sig: Take 1 tablet (25 mg total) by mouth daily.    Dispense:  90 tablet    Refill:  0   rosuvastatin (CRESTOR) 40 MG tablet    Sig: Take 1 tablet (40 mg total) by mouth daily.    Dispense:  30 tablet    Refill:  5   colchicine 0.6 MG tablet    Sig: Take 1 tablet (0.6 mg total) by mouth daily.    Dispense:  30 tablet    Refill:  3     Follow up plan: Return in about 4 weeks (around 08/01/2021).

## 2021-07-05 LAB — CBC WITH DIFFERENTIAL/PLATELET
Basophils Absolute: 0 10*3/uL (ref 0.0–0.2)
Basos: 1 %
EOS (ABSOLUTE): 0.1 10*3/uL (ref 0.0–0.4)
Eos: 2 %
Hematocrit: 41.8 % (ref 37.5–51.0)
Hemoglobin: 14.2 g/dL (ref 13.0–17.7)
Immature Grans (Abs): 0 10*3/uL (ref 0.0–0.1)
Immature Granulocytes: 1 %
Lymphocytes Absolute: 1.8 10*3/uL (ref 0.7–3.1)
Lymphs: 27 %
MCH: 29.6 pg (ref 26.6–33.0)
MCHC: 34 g/dL (ref 31.5–35.7)
MCV: 87 fL (ref 79–97)
Monocytes Absolute: 0.5 10*3/uL (ref 0.1–0.9)
Monocytes: 8 %
Neutrophils Absolute: 4.3 10*3/uL (ref 1.4–7.0)
Neutrophils: 61 %
Platelets: 313 10*3/uL (ref 150–450)
RBC: 4.8 x10E6/uL (ref 4.14–5.80)
RDW: 13.4 % (ref 11.6–15.4)
WBC: 6.8 10*3/uL (ref 3.4–10.8)

## 2021-07-05 LAB — COMPREHENSIVE METABOLIC PANEL
ALT: 18 IU/L (ref 0–44)
AST: 20 IU/L (ref 0–40)
Albumin/Globulin Ratio: 1.4 (ref 1.2–2.2)
Albumin: 4 g/dL (ref 3.8–4.9)
Alkaline Phosphatase: 96 IU/L (ref 44–121)
BUN/Creatinine Ratio: 14 (ref 9–20)
BUN: 14 mg/dL (ref 6–24)
Bilirubin Total: 0.3 mg/dL (ref 0.0–1.2)
CO2: 21 mmol/L (ref 20–29)
Calcium: 9 mg/dL (ref 8.7–10.2)
Chloride: 103 mmol/L (ref 96–106)
Creatinine, Ser: 0.97 mg/dL (ref 0.76–1.27)
Globulin, Total: 2.9 g/dL (ref 1.5–4.5)
Glucose: 87 mg/dL (ref 70–99)
Potassium: 4.2 mmol/L (ref 3.5–5.2)
Sodium: 139 mmol/L (ref 134–144)
Total Protein: 6.9 g/dL (ref 6.0–8.5)
eGFR: 93 mL/min/{1.73_m2} (ref >59)

## 2021-07-05 LAB — PSA: Prostate Specific Ag, Serum: 1.4 ng/mL (ref 0.0–4.0)

## 2021-07-05 LAB — URIC ACID: Uric Acid: 7.6 mg/dL (ref 3.8–8.4)

## 2021-07-05 LAB — TSH: TSH: 0.824 u[IU]/mL (ref 0.450–4.500)

## 2021-08-06 ENCOUNTER — Ambulatory Visit: Payer: No Typology Code available for payment source | Admitting: Internal Medicine

## 2021-08-06 ENCOUNTER — Other Ambulatory Visit: Payer: Self-pay

## 2021-08-06 ENCOUNTER — Encounter: Payer: Self-pay | Admitting: Internal Medicine

## 2021-08-06 VITALS — BP 147/92 | HR 54 | Temp 98.0°F | Ht 67.72 in | Wt 161.2 lb

## 2021-08-06 DIAGNOSIS — M109 Gout, unspecified: Secondary | ICD-10-CM | POA: Diagnosis not present

## 2021-08-06 DIAGNOSIS — E785 Hyperlipidemia, unspecified: Secondary | ICD-10-CM | POA: Diagnosis not present

## 2021-08-06 MED ORDER — ALLOPURINOL 100 MG PO TABS: 100.0000 mg | ORAL_TABLET | Freq: Every day | ORAL | 6 refills | Status: DC

## 2021-08-06 NOTE — Progress Notes (Signed)
? ?BP (!) 147/92   Pulse (!) 54   Temp 98 ?F (36.7 ?C) (Oral)   Ht 5' 7.72" (1.72 m)   Wt 161 lb 3.2 oz (73.1 kg)   SpO2 99%   BMI 24.72 kg/m?   ? ?Subjective:  ? ? Patient ID: Sean Aquas., male    DOB: November 20, 1965, 56 y.o.   MRN: [id number] ? ?Chief Complaint  ?Patient presents with  ? Hypertension  ? Gout  ? Hyperlipidemia  ? ? ?HPI: ?Sean Crochet. is a 56 y.o. male ? ?Hypertension ?This is a chronic problem. The current episode started more than 1 year ago. Pertinent negatives include no chest pain, headaches, palpitations or shortness of breath. There is no history of chronic renal disease.  ?Hyperlipidemia ?This is a chronic problem. The current episode started more than 1 year ago. He has no history of chronic renal disease, diabetes, hypothyroidism, liver disease, obesity or nephrotic syndrome. Pertinent negatives include no chest pain, myalgias or shortness of breath.  ? ?Chief Complaint  ?Patient presents with  ? Hypertension  ? Gout  ? Hyperlipidemia  ? ? ?Relevant past medical, surgical, family and social history reviewed and updated as indicated. Interim medical history since our last visit reviewed. ?Allergies and medications reviewed and updated. ? ?Review of Systems  ?Constitutional:  Negative for activity change, appetite change, chills, fatigue and fever.  ?HENT:  Negative for congestion, ear discharge, ear pain and facial swelling.   ?Eyes:  Negative for pain, discharge and itching.  ?Respiratory:  Negative for cough, chest tightness, shortness of breath and wheezing.   ?Cardiovascular:  Negative for chest pain, palpitations and leg swelling.  ?Gastrointestinal:  Negative for abdominal distention, abdominal pain, blood in stool, constipation, diarrhea, nausea and vomiting.  ?Endocrine: Negative for cold intolerance, heat intolerance, polydipsia, polyphagia and polyuria.  ?Genitourinary:  Negative for difficulty urinating, dysuria, flank pain, frequency, hematuria and urgency.   ?Musculoskeletal:  Negative for arthralgias, gait problem, joint swelling and myalgias.  ?Skin:  Negative for color change, rash and wound.  ?Neurological:  Negative for dizziness, tremors, speech difficulty, weakness, light-headedness, numbness and headaches.  ?Hematological:  Does not bruise/bleed easily.  ?Psychiatric/Behavioral:  Negative for agitation, confusion, decreased concentration, sleep disturbance and suicidal ideas.   ? ?Per HPI unless specifically indicated above ? ?   ?Objective:  ?  ?BP (!) 147/92   Pulse (!) 54   Temp 98 ?F (36.7 ?C) (Oral)   Ht 5' 7.72" (1.72 m)   Wt 161 lb 3.2 oz (73.1 kg)   SpO2 99%   BMI 24.72 kg/m?   ?Wt Readings from Last 3 Encounters:  ?08/06/21 161 lb 3.2 oz (73.1 kg)  ?07/04/21 155 lb 6.4 oz (70.5 kg)  ?12/18/20 160 lb (72.6 kg)  ?  ?Physical Exam ?Vitals and nursing note reviewed.  ?Constitutional:   ?   General: He is not in acute distress. ?   Appearance: Normal appearance. He is not ill-appearing or diaphoretic.  ?HENT:  ?   Head: Normocephalic and atraumatic.  ?   Right Ear: Tympanic membrane and external ear normal. There is no impacted cerumen.  ?   Left Ear: External ear normal.  ?   Nose: No congestion or rhinorrhea.  ?   Mouth/Throat:  ?   Pharynx: No oropharyngeal exudate or posterior oropharyngeal erythema.  ?Eyes:  ?   Conjunctiva/sclera: Conjunctivae normal.  ?   Pupils: Pupils are equal, round, and reactive to light.  ?Cardiovascular:  ?  Rate and Rhythm: Normal rate and regular rhythm.  ?   Heart sounds: No murmur heard. ?  No friction rub. No gallop.  ?Pulmonary:  ?   Effort: No respiratory distress.  ?   Breath sounds: No stridor. No wheezing or rhonchi.  ?Chest:  ?   Chest wall: No tenderness.  ?Abdominal:  ?   General: Abdomen is flat. Bowel sounds are normal.  ?   Palpations: Abdomen is soft. There is no mass.  ?   Tenderness: There is no abdominal tenderness.  ?Musculoskeletal:  ?   Cervical back: Normal range of motion and neck supple. No  rigidity or tenderness.  ?   Left lower leg: No edema.  ?Skin: ?   General: Skin is warm and dry.  ?Neurological:  ?   Mental Status: He is alert.  ? ? ?Results for orders placed or performed in visit on 07/04/21  ?CBC with Differential/Platelet  ?Result Value Ref Range  ? WBC 6.8 3.4 - 10.8 x10E3/uL  ? RBC 4.80 4.14 - 5.80 x10E6/uL  ? Hemoglobin 14.2 13.0 - 17.7 g/dL  ? Hematocrit 41.8 37.5 - 51.0 %  ? MCV 87 79 - 97 fL  ? MCH 29.6 26.6 - 33.0 pg  ? MCHC 34.0 31.5 - 35.7 g/dL  ? RDW 13.4 11.6 - 15.4 %  ? Platelets 313 150 - 450 x10E3/uL  ? Neutrophils 61 Not Estab. %  ? Lymphs 27 Not Estab. %  ? Monocytes 8 Not Estab. %  ? Eos 2 Not Estab. %  ? Basos 1 Not Estab. %  ? Neutrophils Absolute 4.3 1.4 - 7.0 x10E3/uL  ? Lymphocytes Absolute 1.8 0.7 - 3.1 x10E3/uL  ? Monocytes Absolute 0.5 0.1 - 0.9 x10E3/uL  ? EOS (ABSOLUTE) 0.1 0.0 - 0.4 x10E3/uL  ? Basophils Absolute 0.0 0.0 - 0.2 x10E3/uL  ? Immature Granulocytes 1 Not Estab. %  ? Immature Grans (Abs) 0.0 0.0 - 0.1 x10E3/uL  ?Comprehensive metabolic panel  ?Result Value Ref Range  ? Glucose 87 70 - 99 mg/dL  ? BUN 14 6 - 24 mg/dL  ? Creatinine, Ser 0.97 0.76 - 1.27 mg/dL  ? eGFR 93 >59 mL/min/1.73  ? BUN/Creatinine Ratio 14 9 - 20  ? Sodium 139 134 - 144 mmol/L  ? Potassium 4.2 3.5 - 5.2 mmol/L  ? Chloride 103 96 - 106 mmol/L  ? CO2 21 20 - 29 mmol/L  ? Calcium 9.0 8.7 - 10.2 mg/dL  ? Total Protein 6.9 6.0 - 8.5 g/dL  ? Albumin 4.0 3.8 - 4.9 g/dL  ? Globulin, Total 2.9 1.5 - 4.5 g/dL  ? Albumin/Globulin Ratio 1.4 1.2 - 2.2  ? Bilirubin Total 0.3 0.0 - 1.2 mg/dL  ? Alkaline Phosphatase 96 44 - 121 IU/L  ? AST 20 0 - 40 IU/L  ? ALT 18 0 - 44 IU/L  ?PSA  ?Result Value Ref Range  ? Prostate Specific Ag, Serum 1.4 0.0 - 4.0 ng/mL  ?TSH  ?Result Value Ref Range  ? TSH 0.824 0.450 - 4.500 uIU/mL  ?Uric acid  ?Result Value Ref Range  ? Uric Acid 7.6 3.8 - 8.4 mg/dL  ? ?   ? ? ?Current Outpatient Medications:  ?  allopurinol (ZYLOPRIM) 100 MG tablet, Take 1 tablet (100 mg  total) by mouth daily., Disp: 30 tablet, Rfl: 6 ?  aspirin 81 MG EC tablet, Take 1 tablet (81 mg total) by mouth daily. Swallow whole., Disp: 30 tablet, Rfl: 12 ?  colchicine  0.6 MG tablet, Take 1 tablet (0.6 mg total) by mouth daily., Disp: 30 tablet, Rfl: 3 ?  losartan (COZAAR) 25 MG tablet, Take 1 tablet (25 mg total) by mouth daily., Disp: 90 tablet, Rfl: 0 ?  rosuvastatin (CRESTOR) 40 MG tablet, Take 1 tablet (40 mg total) by mouth daily., Disp: 30 tablet, Rfl: 5  ? ? ?Assessment & Plan:  ?HTN ?Is on losartan 25 mg for such  ?Continue current meds.  Medication compliance emphasised. pt advised to keep Bp logs. Pt verbalised understanding of the same. Pt to have a low salt diet . Exercise to reach a goal of at least 150 mins a week.  lifestyle modifications explained and pt understands importance of the above. ?Under good control on current regimen. Continue current regimen. Continue to monitor. Call with any concerns. Refills given. Labs drawn today. ?  ?  ?2. HLD is on crestor  ?recheck FLP, check LFT's work on diet, SE of meds explained to pt. low fat and high fiber diet explained to pt. ?  ?  ?3 . Gout in ankles/ fingers and elbows ?not acutely inflammed  ?Is on colchicine will check URIC acid levels today ?Uric acid levels down now at 7.5  ?Start allopurinol for prevention of such  ?Continue colchicine  ?Rtc  x 3 months with labs.  ? ? ?Problem List Items Addressed This Visit   ? ?  ? Other  ? Hyperlipidemia - Primary  ? Relevant Orders  ? Lipid panel  ? Gout  ? Relevant Orders  ? Uric acid  ? Uric acid  ? Comprehensive metabolic panel  ?  ? ?Orders Placed This Encounter  ?Procedures  ? Lipid panel  ? Uric acid  ? Uric acid  ? Comprehensive metabolic panel  ?  ? ?Meds ordered this encounter  ?Medications  ? allopurinol (ZYLOPRIM) 100 MG tablet  ?  Sig: Take 1 tablet (100 mg total) by mouth daily.  ?  Dispense:  30 tablet  ?  Refill:  6  ?  ? ?Follow up plan: ?Return in about 3 months (around 11/06/2021). ? ?

## 2021-08-07 LAB — COMPREHENSIVE METABOLIC PANEL
ALT: 27 IU/L (ref 0–44)
AST: 23 IU/L (ref 0–40)
Albumin/Globulin Ratio: 1.6 (ref 1.2–2.2)
Albumin: 4.2 g/dL (ref 3.8–4.9)
Alkaline Phosphatase: 91 IU/L (ref 44–121)
BUN/Creatinine Ratio: 8 — ABNORMAL LOW (ref 9–20)
BUN: 10 mg/dL (ref 6–24)
Bilirubin Total: 0.3 mg/dL (ref 0.0–1.2)
CO2: 23 mmol/L (ref 20–29)
Calcium: 9.8 mg/dL (ref 8.7–10.2)
Chloride: 105 mmol/L (ref 96–106)
Creatinine, Ser: 1.31 mg/dL — ABNORMAL HIGH (ref 0.76–1.27)
Globulin, Total: 2.7 g/dL (ref 1.5–4.5)
Glucose: 88 mg/dL (ref 70–99)
Potassium: 4.2 mmol/L (ref 3.5–5.2)
Sodium: 142 mmol/L (ref 134–144)
Total Protein: 6.9 g/dL (ref 6.0–8.5)
eGFR: 64 mL/min/{1.73_m2} (ref >59)

## 2021-08-07 LAB — LIPID PANEL
Chol/HDL Ratio: 4.6 ratio (ref 0.0–5.0)
Cholesterol, Total: 215 mg/dL — ABNORMAL HIGH (ref 100–199)
HDL: 47 mg/dL (ref >39)
LDL Chol Calc (NIH): 151 mg/dL — ABNORMAL HIGH (ref 0–99)
Triglycerides: 93 mg/dL (ref 0–149)
VLDL Cholesterol Cal: 17 mg/dL (ref 5–40)

## 2021-08-07 LAB — URIC ACID: Uric Acid: 8.8 mg/dL — ABNORMAL HIGH (ref 3.8–8.4)

## 2021-10-02 ENCOUNTER — Ambulatory Visit: Payer: No Typology Code available for payment source | Admitting: Internal Medicine

## 2021-10-04 ENCOUNTER — Other Ambulatory Visit: Payer: No Typology Code available for payment source

## 2021-10-11 ENCOUNTER — Ambulatory Visit: Payer: No Typology Code available for payment source | Admitting: Internal Medicine

## 2021-12-03 ENCOUNTER — Other Ambulatory Visit: Payer: Self-pay

## 2021-12-03 MED ORDER — COLCHICINE 0.6 MG PO TABS: 0.6000 mg | ORAL_TABLET | Freq: Every day | ORAL | 0 refills | Status: DC

## 2021-12-03 NOTE — Telephone Encounter (Signed)
Refill request for Colchicine 0.6mg  capsules   LOV 08/06/21  No up coming appt

## 2021-12-04 NOTE — Telephone Encounter (Signed)
Pt scheduled  

## 2021-12-25 ENCOUNTER — Ambulatory Visit: Payer: No Typology Code available for payment source | Admitting: Physician Assistant

## 2022-01-08 IMAGING — CR DG CHEST 2V
2 series · 2 of 2 positions shown · non-contrast
Comparison: August 29, 2014.

CLINICAL DATA: Chest pain.

EXAM:
CHEST - 2 VIEW

[chest pa]
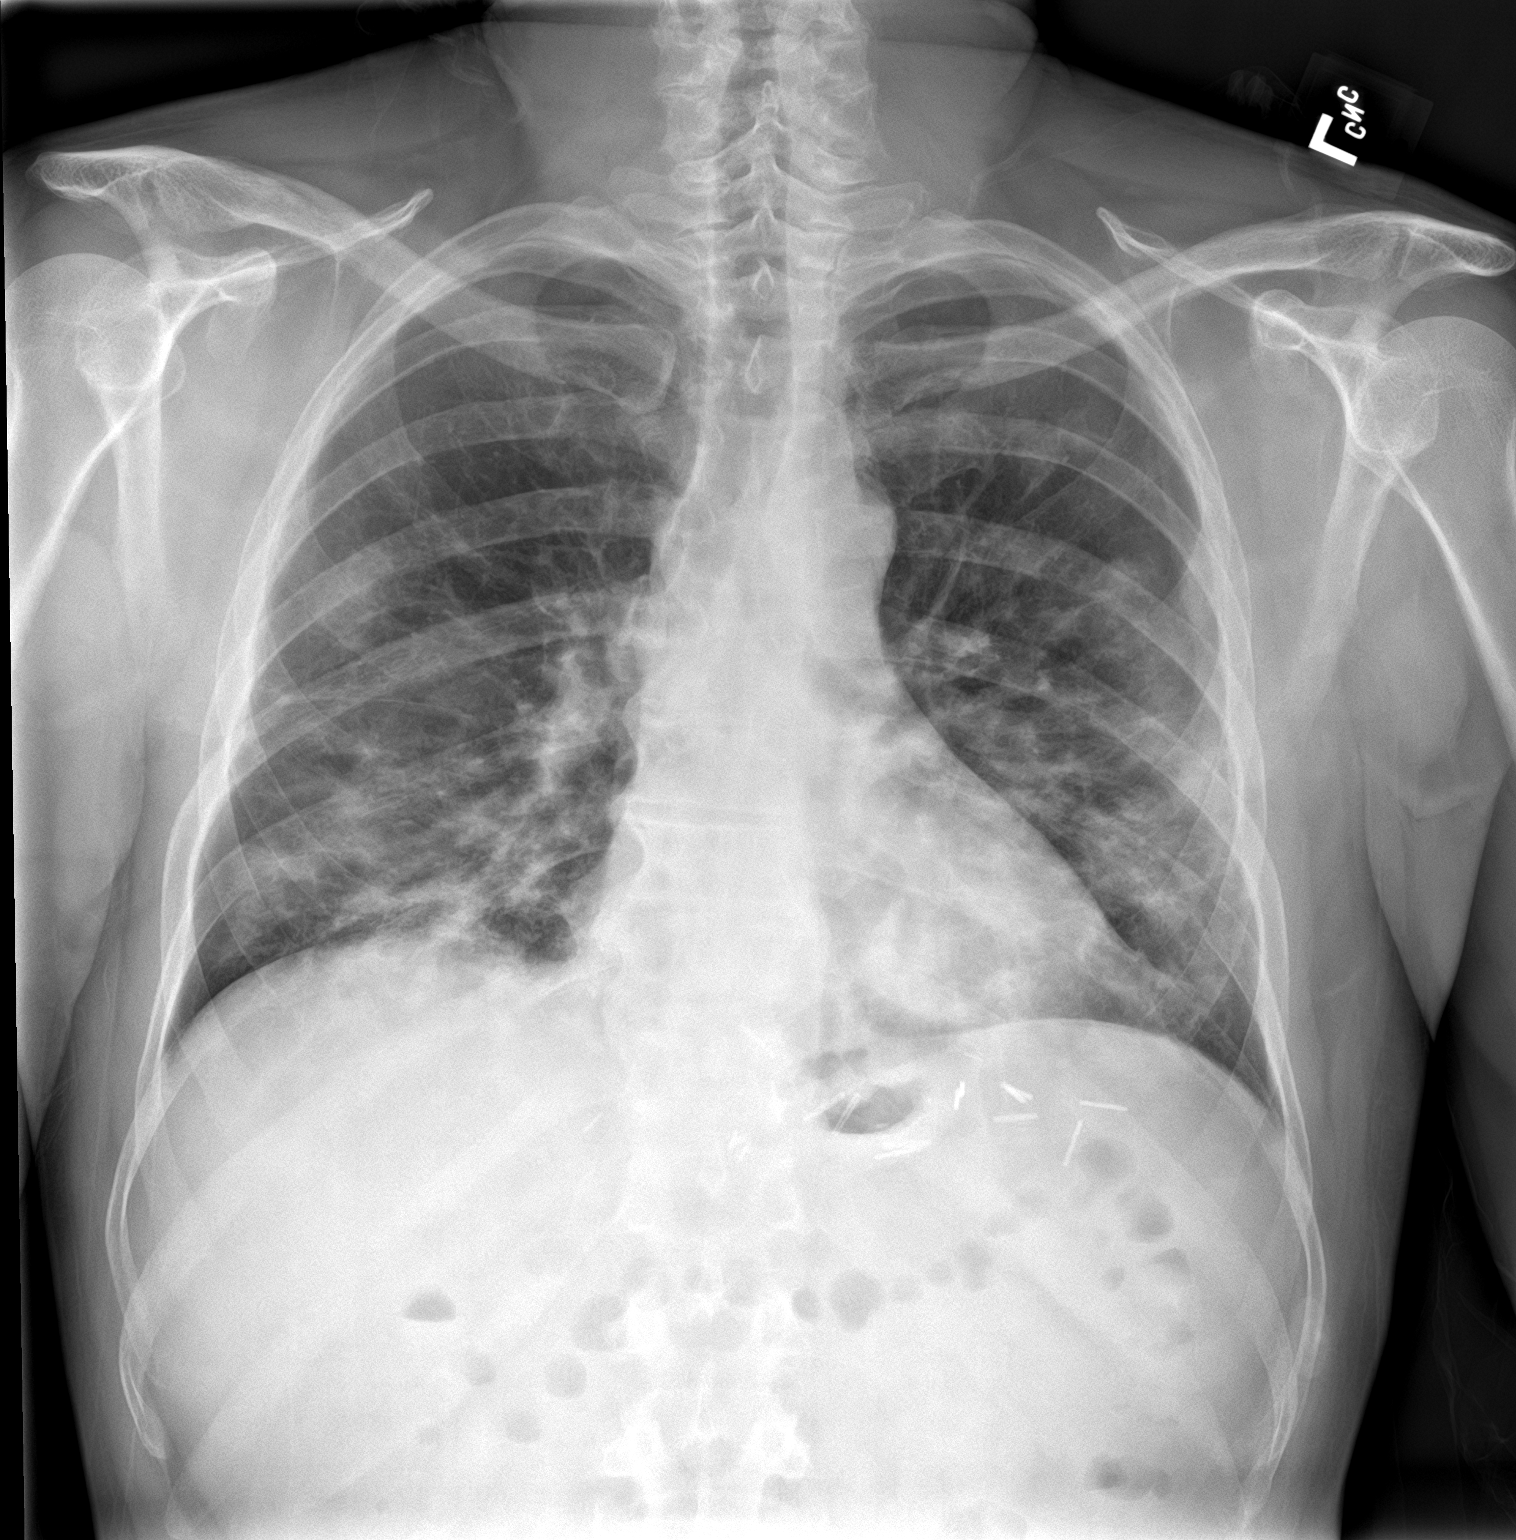

[chest lat]
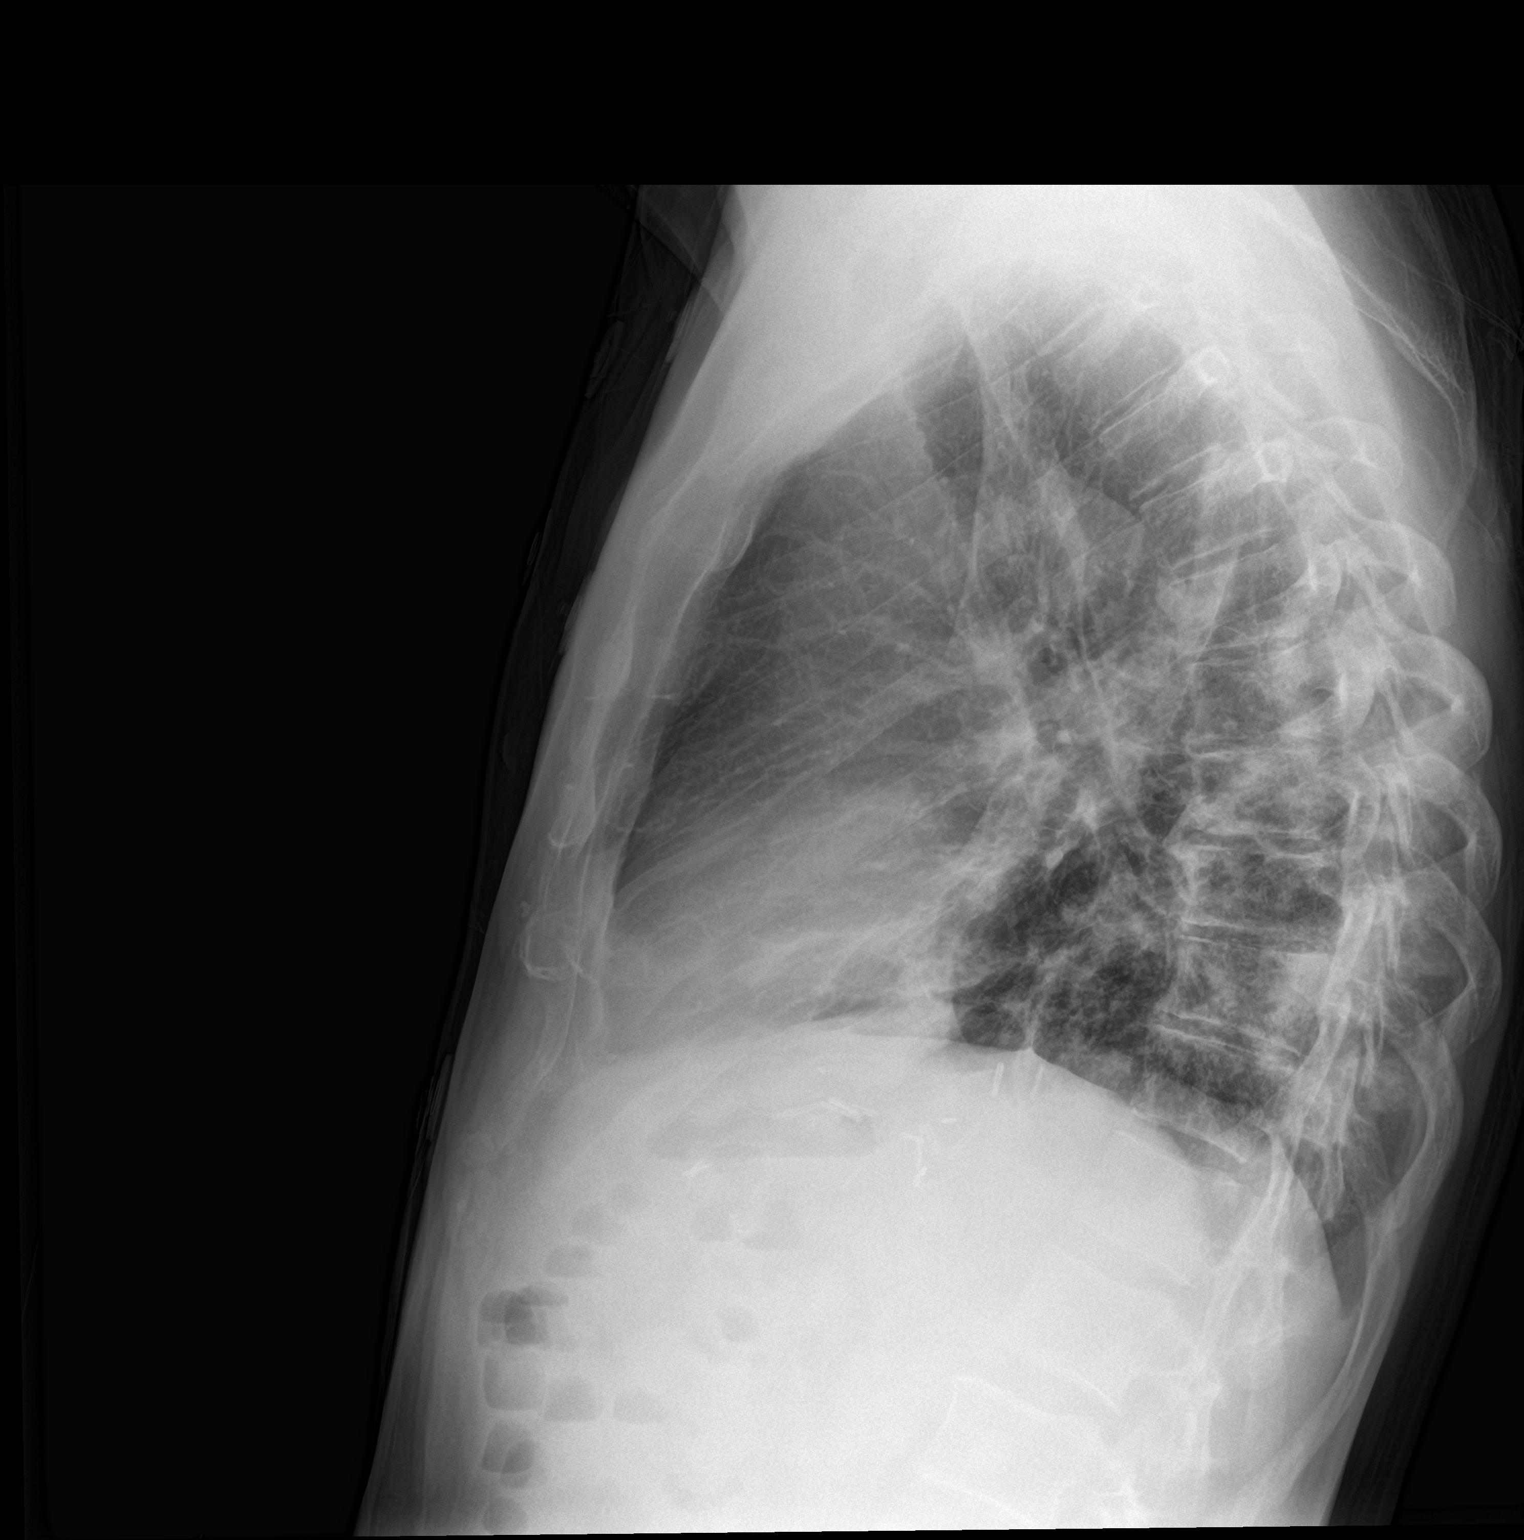

[2 of 2 positions shown; findings below may reference images not displayed]

FINDINGS: The heart size and mediastinal contours are within normal limits. No
pneumothorax or pleural effusion is noted. Multiple ill-defined
airspace opacities are noted throughout both lungs most consistent
with multifocal pneumonia due to [id number]. The visualized skeletal
structures are unremarkable.
IMPRESSION: Bilateral multifocal pneumonia.

## 2022-01-21 ENCOUNTER — Ambulatory Visit: Payer: No Typology Code available for payment source | Admitting: Physician Assistant

## 2022-01-21 ENCOUNTER — Encounter: Payer: Self-pay | Admitting: Physician Assistant

## 2022-01-21 VITALS — BP 152/89 | HR 74 | Temp 98.8°F | Wt 169.1 lb

## 2022-01-21 DIAGNOSIS — I10 Essential (primary) hypertension: Secondary | ICD-10-CM | POA: Diagnosis not present

## 2022-01-21 DIAGNOSIS — M1A09X0 Idiopathic chronic gout, multiple sites, without tophus (tophi): Secondary | ICD-10-CM

## 2022-01-21 DIAGNOSIS — E785 Hyperlipidemia, unspecified: Secondary | ICD-10-CM | POA: Diagnosis not present

## 2022-01-21 MED ORDER — LOSARTAN POTASSIUM 25 MG PO TABS: 25.0000 mg | ORAL_TABLET | Freq: Every day | ORAL | 1 refills | Status: DC

## 2022-01-21 MED ORDER — COLCHICINE 0.6 MG PO TABS: 0.6000 mg | ORAL_TABLET | Freq: Every day | ORAL | 0 refills | Status: DC | PRN

## 2022-01-21 NOTE — Progress Notes (Signed)
Established Patient Office Visit  Name: Sean Delacruz.   MRN: [id number]    DOB: [birth year]   Date:01/22/2022  Today's Provider: Jacquelin Hawking, MHS, PA-C Introduced myself to the patient as a PA-C and provided education on APPs in clinical practice.         Subjective  Chief Complaint  Chief Complaint  Patient presents with   Medication Refill   Hyperlipidemia    Medication Refill Pertinent negatives include no chest pain, headaches, nausea or vomiting.  Hyperlipidemia Pertinent negatives include no chest pain or shortness of breath.    Gout Has not had a flare in almost a month Has been watching trigger foods and avoiding these Is taking Colchicine as needed Tried Allopurinol for 7 days and used Colchicine during but this caused gout symptoms and he stopped it  Reports he is not taking Crestor due to causing gout symptoms    Hypertension: - Medications: Losartan 25 mg PO QD - Compliance: excellent - Checking BP at home: Yes, several times per week  - Denies any SOB, CP, vision changes, LE edema, medication SEs, or symptoms of hypotension - Diet: Trying to avoid red meats, fish and seafood. Trying to eat more vegetables, Drinks tea and sodas somewhat. - Exercise: He is not engaged in regular exercise.      Patient Active Problem List   Diagnosis Date Noted   Elevated serum creatinine 12/18/2020   Elbow swelling, right 12/18/2020   Cerebrovascular accident (CVA) (HCC) 12/18/2020   Headache 05/31/2020   Lab test positive for detection of COVID-19 virus 01/18/2020   Close exposure to COVID-19 virus 01/14/2020   Cellulitis of left hand 06/04/2017   Infrapatellar bursitis of left knee 06/13/2015   Hx of melanoma of skin 01/31/2015   Hyperlipidemia    Hypertension    Gout    Dysphagia     Past Surgical History:  Procedure Laterality Date   HIATAL HERNIA REPAIR     x3   KNEE ARTHROPLASTY     KNEE SURGERY      Family History  Problem Relation  Age of Onset   Hypertension Father    Diabetes Father    Stroke Mother    Hypertension Paternal Grandmother    Myasthenia gravis Other        grandfather   Cancer Maternal Grandmother     Social History   Tobacco Use   Smoking status: Former    Packs/day: 1.25    Years: 8.00    Total pack years: 10.00    Types: Cigarettes    Quit date: 05/13/1996    Years since quitting: 25.7   Smokeless tobacco: Never  Substance Use Topics   Alcohol use: No     Current Outpatient Medications:    aspirin 81 MG EC tablet, Take 1 tablet (81 mg total) by mouth daily. Swallow whole., Disp: 30 tablet, Rfl: 12   colchicine 0.6 MG tablet, Take 1 tablet (0.6 mg total) by mouth daily as needed., Disp: 30 tablet, Rfl: 0   losartan (COZAAR) 25 MG tablet, Take 1 tablet (25 mg total) by mouth daily., Disp: 90 tablet, Rfl: 1  No Known Allergies  I personally reviewed active problem list, medication list, allergies, health maintenance, notes from last encounter, lab results with the patient/caregiver today.   Review of Systems  Eyes:  Negative for blurred vision and double vision.  Respiratory:  Negative for shortness of breath and wheezing.  Cardiovascular:  Negative for chest pain, palpitations and leg swelling.  Gastrointestinal:  Negative for diarrhea, nausea and vomiting.  Neurological:  Negative for dizziness and headaches.      Objective  Vitals:   01/21/22 1450 01/21/22 1455  BP: (!) 153/96 (!) 152/89  Pulse: 73 74  Temp: 98.8 F (37.1 C)   TempSrc: Oral   SpO2: 96%   Weight: 169 lb 1.6 oz (76.7 kg)     Body mass index is 25.93 kg/m.  Physical Exam Vitals reviewed.  Constitutional:      General: He is awake.     Appearance: Normal appearance. He is well-developed, well-groomed and normal weight.  HENT:     Head: Normocephalic and atraumatic.  Eyes:     General: Lids are normal. Gaze aligned appropriately.  Cardiovascular:     Rate and Rhythm: Normal rate and regular  rhythm.     Pulses: Normal pulses.          Radial pulses are 2+ on the right side and 2+ on the left side.     Heart sounds: Normal heart sounds. No murmur heard.    No friction rub. No gallop.  Pulmonary:     Effort: Pulmonary effort is normal.     Breath sounds: Normal breath sounds. No decreased air movement. No decreased breath sounds, wheezing, rhonchi or rales.  Musculoskeletal:     Cervical back: Normal range of motion.     Right lower leg: No edema.     Left lower leg: No edema.  Neurological:     General: No focal deficit present.     Mental Status: He is alert and oriented to person, place, and time.     GCS: GCS eye subscore is 4. GCS verbal subscore is 5. GCS motor subscore is 6.     Cranial Nerves: No dysarthria or facial asymmetry.  Psychiatric:        Attention and Perception: Attention and perception normal.        Mood and Affect: Mood and affect normal.        Speech: Speech normal.        Behavior: Behavior normal. Behavior is cooperative.      No results found for this or any previous visit (from the past 2160 hour(s)).   PHQ2/9:    01/21/2022    2:53 PM 08/06/2021   10:06 AM 12/18/2020    9:58 AM 11/29/2020    2:04 PM 11/22/2020    4:07 PM  Depression screen PHQ 2/9  Decreased Interest 0 0 0 0 0  Down, Depressed, Hopeless 0 0 0 0 0  PHQ - 2 Score 0 0 0 0 0  Altered sleeping 0 0     Tired, decreased energy 0 0     Change in appetite 0 0     Feeling bad or failure about yourself  0 0     Trouble concentrating 0 0     Moving slowly or fidgety/restless 0 0     Suicidal thoughts 0 0     PHQ-9 Score 0 0     Difficult doing work/chores Not difficult at all Not difficult at all         Fall Risk:    01/21/2022    2:52 PM 08/06/2021    9:40 AM 12/18/2020    9:58 AM 11/29/2020    2:04 PM 11/22/2020    4:07 PM  Fall Risk   Falls in the past year?  0 0 0 0  Number falls in past yr: 0 0 0 0 0  Injury with Fall? 0 0 0 0 0  Risk for fall due to : No Fall  Risks No Fall Risks No Fall Risks No Fall Risks No Fall Risks  Follow up Falls evaluation completed Falls evaluation completed Falls evaluation completed Falls evaluation completed Falls evaluation completed      Functional Status Survey:      Assessment & Plan  Problem List Items Addressed This Visit       Cardiovascular and Mediastinum   Hypertension - Primary    Chronic, historic condition Is taking Losartan 25 mg PO QD and appears to be tolerating well BP is elevated today -suspect some white coat as patient reports BP is in goal at home Continue Losartan  Recommend increasing exercise efforts and following heart healthy diet Follow up in 6 months for monitoring and labs      Relevant Medications   losartan (COZAAR) 25 MG tablet     Other   Hyperlipidemia    Chronic, historic condition Reports he was on statin but this caused exacerbation of gout so he was advised to stop it Recommend he try to increase exercise and follow low saturated fat diet to assist with lowering cholesterol  May need to try red yeast rice or niacin to assist with lowering if exercise and diet efforts do not significantly impact cholesterol levels Follow up in 6 months for cholesterol labs and monitoring       Relevant Medications   losartan (COZAAR) 25 MG tablet   Gout    Chronic, recurrent Reports he is currently not experiencing any symptoms or flares States he takes Colchicine when he has a flare, reports Allopurinol caused flare in the past Refills provided for Colchicine to be used in flares Recommend he return in 6 months for labs - to include uric acid levels  Would prefer to wean off colchicine entirely and explore restarting statin for HLD at that time.  Follow up in 6 months       Relevant Medications   colchicine 0.6 MG tablet     Return in about 6 months (around 07/22/2022) for HTN, gout, hld, labs.   I, Ryiah Bellissimo E Odette Watanabe, PA-C, have reviewed all documentation for this  visit. The documentation on 01/22/22 for the exam, diagnosis, procedures, and orders are all accurate and complete.   Jacquelin Hawking, MHS, PA-C [facility]

## 2022-01-22 NOTE — Assessment & Plan Note (Signed)
Chronic, recurrent Reports he is currently not experiencing any symptoms or flares States he takes Colchicine when he has a flare, reports Allopurinol caused flare in the past Refills provided for Colchicine to be used in flares Recommend he return in 6 months for labs - to include uric acid levels  Would prefer to wean off colchicine entirely and explore restarting statin for HLD at that time.  Follow up in 6 months

## 2022-01-22 NOTE — Assessment & Plan Note (Addendum)
Chronic, historic condition Is taking Losartan 25 mg PO QD and appears to be tolerating well BP is elevated today -suspect some white coat as patient reports BP is in goal at home Continue Losartan  Recommend increasing exercise efforts and following heart healthy diet Follow up in 6 months for monitoring and labs

## 2022-01-22 NOTE — Assessment & Plan Note (Signed)
Chronic, historic condition Reports he was on statin but this caused exacerbation of gout so he was advised to stop it Recommend he try to increase exercise and follow low saturated fat diet to assist with lowering cholesterol  May need to try red yeast rice or niacin to assist with lowering if exercise and diet efforts do not significantly impact cholesterol levels Follow up in 6 months for cholesterol labs and monitoring

## 2022-06-04 ENCOUNTER — Encounter: Payer: Self-pay | Admitting: Unknown Physician Specialty

## 2022-06-04 ENCOUNTER — Ambulatory Visit: Payer: Self-pay | Admitting: *Deleted

## 2022-06-04 ENCOUNTER — Ambulatory Visit: Payer: BC Managed Care – PPO | Admitting: Unknown Physician Specialty

## 2022-06-04 VITALS — BP 153/99 | HR 67 | Temp 97.5°F | Ht 67.72 in | Wt 168.9 lb

## 2022-06-04 DIAGNOSIS — M1A0710 Idiopathic chronic gout, right ankle and foot, without tophus (tophi): Secondary | ICD-10-CM | POA: Diagnosis not present

## 2022-06-04 MED ORDER — ALLOPURINOL 100 MG PO TABS: 100.0000 mg | ORAL_TABLET | Freq: Every day | ORAL | 1 refills | Status: DC

## 2022-06-04 MED ORDER — INDOMETHACIN 50 MG PO CAPS: 50.0000 mg | ORAL_CAPSULE | Freq: Three times a day (TID) | ORAL | 0 refills | Status: DC

## 2022-06-04 NOTE — Telephone Encounter (Signed)
Reason for Disposition . [1] SEVERE pain (e.g., excruciating, unable to walk) AND [2] not improved after 2 hours of pain medicine  Answer Assessment - Initial Assessment Questions 1. ONSET: "When did the pain start?"      Sunday 2. LOCATION: "Where is the pain located?"      Right ankle 3. PAIN: "How bad is the pain?"    (Scale 1-10; or mild, moderate, severe)  - MILD (1-3): doesn't interfere with normal activities.   - MODERATE (4-7): interferes with normal activities (e.g., work or school) or awakens from sleep, limping.   - SEVERE (8-10): excruciating pain, unable to do any normal activities, unable to walk.      Severe-10 4. WORK OR EXERCISE: "Has there been any recent work or exercise that involved this part of the body?"      no 5. CAUSE: "What do you think is causing the ankle pain?"     gout 6. OTHER SYMPTOMS: "Do you have any other symptoms?" (e.g., calf pain, rash, fever, swelling)     Swelling, chills  Protocols used: Ankle Pain-A-AH

## 2022-06-04 NOTE — Telephone Encounter (Signed)
3rd attempt, Patient called, left VM to return the call to the office to discuss symptoms with a nurse. Unable to reach patient after 3 attempts by [facility] NT, routing to the provider for resolution per protocol.  Summary: Gout Flare Up     Patient is having a gout flare up. Patient says it's the worse one in two weeks.

## 2022-06-04 NOTE — Telephone Encounter (Signed)
Attempted to return his call.    Left a voicemail to return the call to discuss symptoms with a nurse.

## 2022-06-04 NOTE — Telephone Encounter (Signed)
Message from Maryagnes Amos sent at 06/04/2022  9:43 AM EST  Summary: Gout Flare Up   Patient is having a gout flare up. Patient says it's the worse one in two weeks.          Call History   Type Contact Phone/Fax User  06/04/2022 09:35 AM EST Phone (Incoming) Huston Foley, Mathis Dad. (Self) [phone] Lemmie Evens) Adela Ports

## 2022-06-04 NOTE — Progress Notes (Signed)
BP (!) 153/99   Pulse 67   Temp (!) 97.5 F (36.4 C) (Oral)   Ht 5' 7.72" (1.72 m)   Wt 168 lb 14.4 oz (76.6 kg)   SpO2 98%   BMI 25.90 kg/m    Subjective:    Patient ID: Sean Aquas., male    DOB: 13-Oct-1965, 57 y.o.   MRN: [id number]  HPI: Sean Lantzy. is a 57 y.o. male  Chief Complaint  Patient presents with   Gout    Started on Sunday, Right ankle   Pt presents today with gout flare up in right foot that started 3 days ago. Pt points to the pain near his lateral malleolus of his right ankle. Pt reports having chills when his pain first started and thinks he has had a fever. Pt denies any other systemic symptoms. Pt has tried ibuprofen and states it made his pain more tolerable but would still rate his pain 10/10. Pt states his pain is worse when he first gets up and tries to walk. His pain is better when his foot is elevated.   Relevant past medical, surgical, family and social history reviewed and updated as indicated. Interim medical history since our last visit reviewed. Allergies and medications reviewed and updated.  Review of Systems  Constitutional:  Positive for chills. Negative for fatigue and fever.  Respiratory:  Negative for cough, chest tightness and shortness of breath.   Cardiovascular:  Negative for chest pain and palpitations.  Musculoskeletal:  Negative for joint swelling and myalgias.    Per HPI unless specifically indicated above     Objective:    BP (!) 153/99   Pulse 67   Temp (!) 97.5 F (36.4 C) (Oral)   Ht 5' 7.72" (1.72 m)   Wt 168 lb 14.4 oz (76.6 kg)   SpO2 98%   BMI 25.90 kg/m   Wt Readings from Last 3 Encounters:  06/04/22 168 lb 14.4 oz (76.6 kg)  01/21/22 169 lb 1.6 oz (76.7 kg)  08/06/21 161 lb 3.2 oz (73.1 kg)    Physical Exam Constitutional:      General: He is awake.     Appearance: Normal appearance. He is well-developed, well-groomed and normal weight. He is not ill-appearing, toxic-appearing or  diaphoretic.  HENT:     Head: Normocephalic and atraumatic.  Eyes:     General: Lids are normal.  Pulmonary:     Effort: Pulmonary effort is normal. No accessory muscle usage or respiratory distress.  Musculoskeletal:        General: Tenderness present.     Cervical back: Normal range of motion and neck supple.     Right foot: Normal range of motion.     Comments: RLE tenderness  Feet:     Right foot:     Skin integrity: Erythema present.  Neurological:     General: No focal deficit present.     Mental Status: He is alert and oriented to person, place, and time.  Psychiatric:        Mood and Affect: Mood normal.        Behavior: Behavior normal. Behavior is cooperative.        Thought Content: Thought content normal.        Judgment: Judgment normal.     Results for orders placed or performed in visit on 08/06/21  Uric acid  Result Value Ref Range   Uric Acid 8.8 (H) 3.8 - 8.4 mg/dL  Comprehensive metabolic  panel  Result Value Ref Range   Glucose 88 70 - 99 mg/dL   BUN 10 6 - 24 mg/dL   Creatinine, Ser 1.31 (H) 0.76 - 1.27 mg/dL   eGFR 64 >59 mL/min/1.73   BUN/Creatinine Ratio 8 (L) 9 - 20   Sodium 142 134 - 144 mmol/L   Potassium 4.2 3.5 - 5.2 mmol/L   Chloride 105 96 - 106 mmol/L   CO2 23 20 - 29 mmol/L   Calcium 9.8 8.7 - 10.2 mg/dL   Total Protein 6.9 6.0 - 8.5 g/dL   Albumin 4.2 3.8 - 4.9 g/dL   Globulin, Total 2.7 1.5 - 4.5 g/dL   Albumin/Globulin Ratio 1.6 1.2 - 2.2   Bilirubin Total 0.3 0.0 - 1.2 mg/dL   Alkaline Phosphatase 91 44 - 121 IU/L   AST 23 0 - 40 IU/L   ALT 27 0 - 44 IU/L  Lipid panel  Result Value Ref Range   Cholesterol, Total 215 (H) 100 - 199 mg/dL   Triglycerides 93 0 - 149 mg/dL   HDL 47 >39 mg/dL   VLDL Cholesterol Cal 17 5 - 40 mg/dL   LDL Chol Calc (NIH) 151 (H) 0 - 99 mg/dL   Chol/HDL Ratio 4.6 0.0 - 5.0 ratio      Assessment & Plan:   Problem List Items Addressed This Visit       Unprioritized   Gout - Primary     Chronic, recurrent Reports recent flare for past 3 days States he has been taking colchicine every day Discussed foods to avoid to prevent gout flare/increased uric acid level Will order indocin to start for flare and allopurinol to start after flare has resolved. Counseled to take colchicine with Allopurinol for now.  Will f/u in 1 month for evaluation and possible dose increase        Follow up plan: Return in about 4 weeks (around 07/02/2022) for f/u on Gout flare.

## 2022-06-04 NOTE — Telephone Encounter (Signed)
  Chief Complaint: ankle pain- gout flare Symptoms: pain, swelling, redness, chills Frequency: stated Sunday Pertinent Negatives: Patient denies   Disposition: [] ED /[] Urgent Care (no appt availability in office) / [x] Appointment(In office/virtual)/ []  [place] [facility]/ [] Home Care/ [] Refused Recommended Disposition /[] [place] [facility]/ []  Follow-up with PCP Additional Notes: Patient believes he is having gout flare- pain, swelling in R ankle- patient also hac cold chills- states normal Colchicine  treatment not helping

## 2022-06-04 NOTE — Telephone Encounter (Signed)
2nd attempt to return his call.   Left voicemail to call back.

## 2022-06-04 NOTE — Assessment & Plan Note (Addendum)
Chronic, recurrent Reports recent flare for past 3 days States he has been taking colchicine every day Discussed foods to avoid to prevent gout flare/increased uric acid level Will order indocin to start for flare and allopurinol to start after flare has resolved. Counseled to take colchicine with Allopurinol for now.  Will f/u in 1 month for evaluation and possible dose increase

## 2022-07-04 ENCOUNTER — Ambulatory Visit: Payer: BC Managed Care – PPO | Admitting: Family Medicine

## 2022-07-17 ENCOUNTER — Other Ambulatory Visit: Payer: Self-pay | Admitting: Family Medicine

## 2022-07-17 DIAGNOSIS — M1A09X0 Idiopathic chronic gout, multiple sites, without tophus (tophi): Secondary | ICD-10-CM

## 2022-07-17 MED ORDER — COLCHICINE 0.6 MG PO TABS: 0.6000 mg | ORAL_TABLET | Freq: Every day | ORAL | 0 refills | Status: DC | PRN

## 2022-07-17 NOTE — Telephone Encounter (Signed)
Pt called to report that he has been out for 5 days. Says his pharmacy requested this supply last week.

## 2022-07-17 NOTE — Telephone Encounter (Signed)
Medication Refill - Medication: colchicine 0.6 MG tablet   Has the patient contacted their pharmacy? yes (Agent: If no, request that the patient contact the pharmacy for the refill. If patient does not wish to contact the pharmacy document the reason why and proceed with request.) (Agent: If yes, when and what did the pharmacy advise?)contact pcp  Preferred Pharmacy (with phone number or street name):  [facility] [place], [place] Phone: [phone]  Fax: [phone]     Has the patient been seen for an appointment in the last year OR does the patient have an upcoming appointment? yes  Agent: Please be advised that RX refills may take up to 3 business days. We ask that you follow-up with your pharmacy.

## 2022-07-17 NOTE — Telephone Encounter (Signed)
Requested Prescriptions  Pending Prescriptions Disp Refills   colchicine 0.6 MG tablet 90 tablet 0    Sig: Take 1 tablet (0.6 mg total) by mouth daily as needed.     Endocrinology:  Gout Agents - colchicine Failed - 07/17/2022  3:49 PM      Failed - Cr in normal range and within 360 days    Creatinine, Ser  Date Value Ref Range Status  08/06/2021 1.31 (H) 0.76 - 1.27 mg/dL Final         Failed - CBC within normal limits and completed in the last 12 months    WBC  Date Value Ref Range Status  07/04/2021 6.8 3.4 - 10.8 x10E3/uL Final  11/09/2020 7.5 4.0 - 10.5 K/uL Final   RBC  Date Value Ref Range Status  07/04/2021 4.80 4.14 - 5.80 x10E6/uL Final  11/09/2020 5.09 4.22 - 5.81 MIL/uL Final   Hemoglobin  Date Value Ref Range Status  07/04/2021 14.2 13.0 - 17.7 g/dL Final   Hematocrit  Date Value Ref Range Status  07/04/2021 41.8 37.5 - 51.0 % Final   MCHC  Date Value Ref Range Status  07/04/2021 34.0 31.5 - 35.7 g/dL Final  11/09/2020 34.5 30.0 - 36.0 g/dL Final   [facility]  Date Value Ref Range Status  07/04/2021 29.6 26.6 - 33.0 pg Final  11/09/2020 29.5 26.0 - 34.0 pg Final   MCV  Date Value Ref Range Status  07/04/2021 87 79 - 97 fL Final   No results found for: "PLTCOUNTKUC", "LABPLAT", "POCPLA" RDW  Date Value Ref Range Status  07/04/2021 13.4 11.6 - 15.4 % Final         Passed - ALT in normal range and within 360 days    ALT  Date Value Ref Range Status  08/06/2021 27 0 - 44 IU/L Final         Passed - AST in normal range and within 360 days    AST  Date Value Ref Range Status  08/06/2021 23 0 - 40 IU/L Final         Passed - Valid encounter within last 12 months    Recent Outpatient Visits           1 month ago Idiopathic chronic gout of right ankle without tophus   [place] [facility] Kathrine Haddock, NP   5 months ago Primary hypertension   [place] [facility] Mecum, Erin E, PA-C   11 months ago Hyperlipidemia,  unspecified hyperlipidemia type   [place] [facility] Vigg, Avanti, MD   1 year ago Hyperlipidemia, unspecified hyperlipidemia type   [place] Vigg, Avanti, MD   1 year ago Elevated serum creatinine   [place] [facility] Charlynne Cousins, MD

## 2022-08-08 ENCOUNTER — Other Ambulatory Visit: Payer: Self-pay | Admitting: Physician Assistant

## 2022-08-08 DIAGNOSIS — I10 Essential (primary) hypertension: Secondary | ICD-10-CM

## 2022-08-08 NOTE — Telephone Encounter (Signed)
Scheduled patient for a follow up for tomorrow on 08/08/2022 at 11:20 am.

## 2022-08-08 NOTE — Telephone Encounter (Signed)
Does he have enough to make it to his appointment tomorrow.

## 2022-08-08 NOTE — Telephone Encounter (Signed)
FYI

## 2022-08-08 NOTE — Telephone Encounter (Signed)
Requested medication (s) are due for refill today:   Yes  Requested medication (s) are on the active medication list:   Yes  Future visit scheduled:   No   Last ordered: 01/21/2022 #90, 1 refill  Returned because labs due per protocol   Requested Prescriptions  Pending Prescriptions Disp Refills   losartan (COZAAR) 25 MG tablet [Pharmacy Med Name: LOSARTAN POTASSIUM 25 MG TAB] 90 tablet 1    Sig: Take 1 tablet (25 mg total) by mouth daily.     Cardiovascular:  Angiotensin Receptor Blockers Failed - 08/08/2022  2:12 AM      Failed - Cr in normal range and within 180 days    Creatinine, Ser  Date Value Ref Range Status  08/06/2021 1.31 (H) 0.76 - 1.27 mg/dL Final         Failed - K in normal range and within 180 days    Potassium  Date Value Ref Range Status  08/06/2021 4.2 3.5 - 5.2 mmol/L Final         Failed - Last BP in normal range    BP Readings from Last 1 Encounters:  06/04/22 (!) 153/99         Passed - Patient is not pregnant      Passed - Valid encounter within last 6 months    Recent Outpatient Visits           2 months ago Idiopathic chronic gout of right ankle without tophus   [place] [facility] Kathrine Haddock, NP   6 months ago Primary hypertension   [place] [facility] Mecum, Dani Gobble, PA-C   1 year ago Hyperlipidemia, unspecified hyperlipidemia type   [place] Vigg, Avanti, MD   1 year ago Hyperlipidemia, unspecified hyperlipidemia type   [place] Vigg, Avanti, MD   1 year ago Elevated serum creatinine   [place] [facility] [place] Charlynne Cousins, MD

## 2022-08-08 NOTE — Telephone Encounter (Signed)
Patient is due for a follow up. Please call to schedule.

## 2022-08-09 ENCOUNTER — Encounter: Payer: Self-pay | Admitting: Family Medicine

## 2022-08-09 ENCOUNTER — Ambulatory Visit: Payer: BC Managed Care – PPO | Admitting: Family Medicine

## 2022-08-09 VITALS — BP 134/85 | HR 57 | Temp 98.3°F | Ht 67.72 in | Wt 166.7 lb

## 2022-08-09 DIAGNOSIS — Z0000 Encounter for general adult medical examination without abnormal findings: Secondary | ICD-10-CM

## 2022-08-09 DIAGNOSIS — M1A0610 Idiopathic chronic gout, right knee, without tophus (tophi): Secondary | ICD-10-CM | POA: Diagnosis not present

## 2022-08-09 DIAGNOSIS — E785 Hyperlipidemia, unspecified: Secondary | ICD-10-CM | POA: Diagnosis not present

## 2022-08-09 DIAGNOSIS — Z1211 Encounter for screening for malignant neoplasm of colon: Secondary | ICD-10-CM | POA: Diagnosis not present

## 2022-08-09 DIAGNOSIS — I10 Essential (primary) hypertension: Secondary | ICD-10-CM

## 2022-08-09 LAB — URINALYSIS, ROUTINE W REFLEX MICROSCOPIC
Bilirubin, UA: NEGATIVE
Glucose, UA: NEGATIVE
Ketones, UA: NEGATIVE
Leukocytes,UA: NEGATIVE
Nitrite, UA: NEGATIVE
Protein,UA: NEGATIVE
Specific Gravity, UA: 1.02 (ref 1.005–1.030)
Urobilinogen, Ur: 0.2 mg/dL (ref 0.2–1.0)
pH, UA: 6.5 (ref 5.0–7.5)

## 2022-08-09 LAB — MICROSCOPIC EXAMINATION
Bacteria, UA: NONE SEEN
Epithelial Cells (non renal): NONE SEEN /hpf (ref 0–10)

## 2022-08-09 LAB — MICROALBUMIN, URINE WAIVED
Creatinine, Urine Waived: 50 mg/dL (ref 10–300)
Microalb, Ur Waived: 30 mg/L — ABNORMAL HIGH (ref 0–19)

## 2022-08-09 MED ORDER — PREDNISONE 50 MG PO TABS: 50.0000 mg | ORAL_TABLET | Freq: Every day | ORAL | 0 refills | Status: DC

## 2022-08-09 MED ORDER — FEBUXOSTAT 40 MG PO TABS: 40.0000 mg | ORAL_TABLET | Freq: Every day | ORAL | 3 refills | Status: DC

## 2022-08-09 MED ORDER — TRIAMCINOLONE ACETONIDE 40 MG/ML IJ SUSP
40.0000 mg | Freq: Once | INTRAMUSCULAR | Status: AC
  Administered 2022-08-09: 40 mg via INTRAMUSCULAR

## 2022-08-09 NOTE — Assessment & Plan Note (Signed)
In acute flare. Will treat with prednisone and colchicine. Will start uloric. Checking labs. Await results.

## 2022-08-09 NOTE — Progress Notes (Signed)
BP 134/85   Pulse (!) 57   Temp 98.3 F (36.8 C) (Oral)   Ht 5' 7.72" (1.72 m)   Wt 166 lb 11.2 oz (75.6 kg)   SpO2 98%   BMI 25.56 kg/m    Subjective:    Patient ID: Sean Aquas., male    DOB: 1966/03/10, 57 y.o.   MRN: [id number]  HPI: Sean Hauke. is a 57 y.o. male presenting on 08/09/2022 for comprehensive medical examination. Current medical complaints include:  Has had gout flare for about 2 weeks in his R knee, it's doing better, but it's painful and red.  HYPERTENSION / HYPERLIPIDEMIA Satisfied with current treatment? yes Duration of hypertension: chronic BP monitoring frequency: not checking BP medication side effects: no Past BP meds: losartan Duration of hyperlipidemia: chronic Cholesterol medication side effects: no Cholesterol supplements: none Past cholesterol medications: none Medication compliance: excellent compliance Aspirin: yes Recent stressors: no Recurrent headaches: no Visual changes: no Palpitations: no Dyspnea: no Chest pain: no Lower extremity edema: no Dizzy/lightheaded: no  He currently lives with: wife Interim Problems from his last visit: no  Depression Screen done today and results listed below:     08/09/2022   11:28 AM 01/21/2022    2:53 PM 08/06/2021   10:06 AM 12/18/2020    9:58 AM 11/29/2020    2:04 PM  Depression screen PHQ 2/9  Decreased Interest 0 0 0 0 0  Down, Depressed, Hopeless 0 0 0 0 0  PHQ - 2 Score 0 0 0 0 0  Altered sleeping 0 0 0    Tired, decreased energy 0 0 0    Change in appetite 0 0 0    Feeling bad or failure about yourself  0 0 0    Trouble concentrating 0 0 0    Moving slowly or fidgety/restless 0 0 0    Suicidal thoughts 0 0 0    PHQ-9 Score 0 0 0    Difficult doing work/chores Not difficult at all Not difficult at all Not difficult at all      Past Medical History:  Past Medical History:  Diagnosis Date   Allergic rhinitis    Dysphagia    Gout    History of cellulitis 11/2013  and 8/15   Hyperlipidemia    Hypertension     Surgical History:  Past Surgical History:  Procedure Laterality Date   HIATAL HERNIA REPAIR     x3   KNEE ARTHROPLASTY     KNEE SURGERY      Medications:  Current Outpatient Medications on File Prior to Visit  Medication Sig   aspirin 81 MG EC tablet Take 1 tablet (81 mg total) by mouth daily. Swallow whole.   colchicine 0.6 MG tablet Take 1 tablet (0.6 mg total) by mouth daily as needed.   indomethacin (INDOCIN) 50 MG capsule Take 1 capsule (50 mg total) by mouth 3 (three) times daily with meals.   losartan (COZAAR) 25 MG tablet Take 1 tablet (25 mg total) by mouth daily.   No current facility-administered medications on file prior to visit.    Allergies:  No Known Allergies  Social History:  Social History   Socioeconomic History   Marital status: Married    Spouse name: Not on file   Number of children: Not on file   Years of education: Not on file   Highest education level: Not on file  Occupational History   Not on file  Tobacco Use  Smoking status: Former    Packs/day: 1.25    Years: 8.00    Additional pack years: 0.00    Total pack years: 10.00    Types: Cigarettes    Quit date: 05/13/1996    Years since quitting: 26.2   Smokeless tobacco: Never  Vaping Use   Vaping Use: Never used  Substance and Sexual Activity   Alcohol use: No   Drug use: No   Sexual activity: Never    Birth control/protection: None  Other Topics Concern   Not on file  Social History Narrative   Not on file   Social Determinants of Health   Financial Resource Strain: Not on file  Food Insecurity: Not on file  Transportation Needs: Not on file  Physical Activity: Not on file  Stress: Not on file  Social Connections: Not on file  Intimate Partner Violence: Not on file   Social History   Tobacco Use  Smoking Status Former   Packs/day: 1.25   Years: 8.00   Additional pack years: 0.00   Total pack years: 10.00   Types:  Cigarettes   Quit date: 05/13/1996   Years since quitting: 26.2  Smokeless Tobacco Never   Social History   Substance and Sexual Activity  Alcohol Use No    Family History:  Family History  Problem Relation Age of Onset   Hypertension Father    Diabetes Father    Stroke Mother    Hypertension Paternal Grandmother    Myasthenia gravis Other        grandfather   Cancer Maternal Grandmother     Past medical history, surgical history, medications, allergies, family history and social history reviewed with patient today and changes made to appropriate areas of the chart.   Review of Systems  Constitutional: Negative.   HENT: Negative.    Eyes: Negative.   Respiratory: Negative.    Cardiovascular: Negative.   Gastrointestinal:  Positive for heartburn. Negative for abdominal pain, blood in stool, constipation, diarrhea, melena, nausea and vomiting.  Genitourinary: Negative.   Musculoskeletal:  Positive for joint pain. Negative for back pain, falls, myalgias and neck pain.  Skin: Negative.   Neurological: Negative.   Endo/Heme/Allergies: Negative.   Psychiatric/Behavioral: Negative.     All other ROS negative except what is listed above and in the HPI.      Objective:    BP 134/85   Pulse (!) 57   Temp 98.3 F (36.8 C) (Oral)   Ht 5' 7.72" (1.72 m)   Wt 166 lb 11.2 oz (75.6 kg)   SpO2 98%   BMI 25.56 kg/m   Wt Readings from Last 3 Encounters:  08/09/22 166 lb 11.2 oz (75.6 kg)  06/04/22 168 lb 14.4 oz (76.6 kg)  01/21/22 169 lb 1.6 oz (76.7 kg)    Physical Exam Vitals and nursing note reviewed.  Constitutional:      General: He is not in acute distress.    Appearance: Normal appearance. He is not ill-appearing, toxic-appearing or diaphoretic.  HENT:     Head: Normocephalic and atraumatic.     Right Ear: Tympanic membrane, ear canal and external ear normal. There is no impacted cerumen.     Left Ear: Tympanic membrane, ear canal and external ear normal. There is  no impacted cerumen.     Nose: Nose normal. No congestion or rhinorrhea.     Mouth/Throat:     Mouth: Mucous membranes are moist.     Pharynx: Oropharynx is clear. No  oropharyngeal exudate or posterior oropharyngeal erythema.  Eyes:     General: No scleral icterus.       Right eye: No discharge.        Left eye: No discharge.     Extraocular Movements: Extraocular movements intact.     Conjunctiva/sclera: Conjunctivae normal.     Pupils: Pupils are equal, round, and reactive to light.  Neck:     Vascular: No carotid bruit.  Cardiovascular:     Rate and Rhythm: Normal rate and regular rhythm.     Pulses: Normal pulses.     Heart sounds: No murmur heard.    No friction rub. No gallop.  Pulmonary:     Effort: Pulmonary effort is normal. No respiratory distress.     Breath sounds: Normal breath sounds. No stridor. No wheezing, rhonchi or rales.  Chest:     Chest wall: No tenderness.  Abdominal:     General: Abdomen is flat. Bowel sounds are normal. There is no distension.     Palpations: Abdomen is soft. There is no mass.     Tenderness: There is no abdominal tenderness. There is no right CVA tenderness, left CVA tenderness, guarding or rebound.     Hernia: No hernia is present.  Genitourinary:    Comments: Genital exam deferred with shared decision making Musculoskeletal:        General: Swelling (R knee) and tenderness (R knee) present. No deformity or signs of injury.     Cervical back: Normal range of motion and neck supple. No rigidity. No muscular tenderness.     Right lower leg: No edema.     Left lower leg: No edema.  Lymphadenopathy:     Cervical: No cervical adenopathy.  Skin:    General: Skin is warm and dry.     Capillary Refill: Capillary refill takes less than 2 seconds.     Coloration: Skin is not jaundiced or pale.     Findings: No bruising, erythema, lesion or rash.  Neurological:     General: No focal deficit present.     Mental Status: He is alert and  oriented to person, place, and time.     Cranial Nerves: No cranial nerve deficit.     Sensory: No sensory deficit.     Motor: No weakness.     Coordination: Coordination normal.     Gait: Gait normal.     Deep Tendon Reflexes: Reflexes normal.  Psychiatric:        Mood and Affect: Mood normal.        Behavior: Behavior normal.        Thought Content: Thought content normal.        Judgment: Judgment normal.     Results for orders placed or performed in visit on 08/06/21  Uric acid  Result Value Ref Range   Uric Acid 8.8 (H) 3.8 - 8.4 mg/dL  Comprehensive metabolic panel  Result Value Ref Range   Glucose 88 70 - 99 mg/dL   BUN 10 6 - 24 mg/dL   Creatinine, Ser 1.31 (H) 0.76 - 1.27 mg/dL   eGFR 64 >59 mL/min/1.73   BUN/Creatinine Ratio 8 (L) 9 - 20   Sodium 142 134 - 144 mmol/L   Potassium 4.2 3.5 - 5.2 mmol/L   Chloride 105 96 - 106 mmol/L   CO2 23 20 - 29 mmol/L   Calcium 9.8 8.7 - 10.2 mg/dL   Total Protein 6.9 6.0 - 8.5 g/dL   Albumin 4.2  3.8 - 4.9 g/dL   Globulin, Total 2.7 1.5 - 4.5 g/dL   Albumin/Globulin Ratio 1.6 1.2 - 2.2   Bilirubin Total 0.3 0.0 - 1.2 mg/dL   Alkaline Phosphatase 91 44 - 121 IU/L   AST 23 0 - 40 IU/L   ALT 27 0 - 44 IU/L  Lipid panel  Result Value Ref Range   Cholesterol, Total 215 (H) 100 - 199 mg/dL   Triglycerides 93 0 - 149 mg/dL   HDL 47 >39 mg/dL   VLDL Cholesterol Cal 17 5 - 40 mg/dL   LDL Chol Calc (NIH) 151 (H) 0 - 99 mg/dL   Chol/HDL Ratio 4.6 0.0 - 5.0 ratio      Assessment & Plan:   Problem List Items Addressed This Visit       Cardiovascular and Mediastinum   Hypertension    Under good control on current regimen. Continue current regimen. Continue to monitor. Call with any concerns. Refills given. Labs drawn today.       Relevant Orders   Microalbumin, Urine Waived     Other   Hyperlipidemia    Rechecking labs today. Await results. Treat as needed.       Relevant Orders   Lipid Panel w/o Chol/HDL Ratio    Gout    In acute flare. Will treat with prednisone and colchicine. Will start uloric. Checking labs. Await results.       Relevant Orders   Uric acid   Other Visit Diagnoses     Routine general medical examination at a health care facility    -  Primary   Vaccines up to date. Screening labs checked today. Colonoscopy ordered. Continue diet and exercise. Call with any concerns.   Relevant Orders   Comprehensive metabolic panel   CBC with Differential/Platelet   Lipid Panel w/o Chol/HDL Ratio   PSA   TSH   Urinalysis, Routine w reflex microscopic   Microalbumin, Urine Waived   Uric acid   HIV Antibody (routine testing w rflx)   Hepatitis C Antibody   Screening for colon cancer       Referral back to [facility] placed today.   Relevant Orders   Ambulatory referral to Gastroenterology        LABORATORY TESTING:  Health maintenance labs ordered today as discussed above.   The natural history of prostate cancer and ongoing controversy regarding screening and potential treatment outcomes of prostate cancer has been discussed with the patient. The meaning of a false positive PSA and a false negative PSA has been discussed. He indicates understanding of the limitations of this screening test and wishes to proceed with screening PSA testing.   IMMUNIZATIONS:   - Tdap: Tetanus vaccination status reviewed: last tetanus booster within 10 years. - Influenza: Refused - Pneumovax: Not applicable - Prevnar: Not applicable - COVID: Refused - HPV: Not applicable - Shingrix vaccine: Refused  SCREENING: - Colonoscopy: Ordered today  Discussed with patient purpose of the colonoscopy is to detect colon cancer at curable precancerous or early stages   PATIENT COUNSELING:    Sexuality: Discussed sexually transmitted diseases, partner selection, use of condoms, avoidance of unintended pregnancy  and contraceptive alternatives.   Advised to avoid cigarette smoking.  I discussed with the  patient that most people either abstain from alcohol or drink within safe limits (<=14/week and <=4 drinks/occasion for males, <=7/weeks and <= 3 drinks/occasion for females) and that the risk for alcohol disorders and other health effects rises proportionally with  the number of drinks per week and how often a drinker exceeds daily limits.  Discussed cessation/primary prevention of drug use and availability of treatment for abuse.   Diet: Encouraged to adjust caloric intake to maintain  or achieve ideal body weight, to reduce intake of dietary saturated fat and total fat, to limit sodium intake by avoiding high sodium foods and not adding table salt, and to maintain adequate dietary potassium and calcium preferably from fresh fruits, vegetables, and low-fat dairy products.    stressed the importance of regular exercise  Injury prevention: Discussed safety belts, safety helmets, smoke detector, smoking near bedding or upholstery.   Dental health: Discussed importance of regular tooth brushing, flossing, and dental visits.   Follow up plan: NEXT PREVENTATIVE PHYSICAL DUE IN 1 YEAR. Return in about 4 weeks (around 09/06/2022) for follow up gout.

## 2022-08-09 NOTE — Assessment & Plan Note (Signed)
Under good control on current regimen. Continue current regimen. Continue to monitor. Call with any concerns. Refills given. Labs drawn today.   

## 2022-08-09 NOTE — Assessment & Plan Note (Signed)
Rechecking labs today. Await results. Treat as needed.  °

## 2022-08-10 LAB — CBC WITH DIFFERENTIAL/PLATELET
Basophils Absolute: 0.1 10*3/uL (ref 0.0–0.2)
Basos: 1 %
EOS (ABSOLUTE): 0.1 10*3/uL (ref 0.0–0.4)
Eos: 1 %
Hematocrit: 43.2 % (ref 37.5–51.0)
Hemoglobin: 15 g/dL (ref 13.0–17.7)
Immature Grans (Abs): 0 10*3/uL (ref 0.0–0.1)
Immature Granulocytes: 0 %
Lymphocytes Absolute: 1.7 10*3/uL (ref 0.7–3.1)
Lymphs: 29 %
MCH: 29.4 pg (ref 26.6–33.0)
MCHC: 34.7 g/dL (ref 31.5–35.7)
MCV: 85 fL (ref 79–97)
Monocytes Absolute: 0.5 10*3/uL (ref 0.1–0.9)
Monocytes: 8 %
Neutrophils Absolute: 3.5 10*3/uL (ref 1.4–7.0)
Neutrophils: 61 %
Platelets: 278 10*3/uL (ref 150–450)
RBC: 5.1 x10E6/uL (ref 4.14–5.80)
RDW: 12.7 % (ref 11.6–15.4)
WBC: 5.8 10*3/uL (ref 3.4–10.8)

## 2022-08-10 LAB — URIC ACID: Uric Acid: 7.5 mg/dL (ref 3.8–8.4)

## 2022-08-10 LAB — LIPID PANEL W/O CHOL/HDL RATIO
Cholesterol, Total: 207 mg/dL — ABNORMAL HIGH (ref 100–199)
HDL: 44 mg/dL (ref >39)
LDL Chol Calc (NIH): 141 mg/dL — ABNORMAL HIGH (ref 0–99)
Triglycerides: 123 mg/dL (ref 0–149)
VLDL Cholesterol Cal: 22 mg/dL (ref 5–40)

## 2022-08-10 LAB — COMPREHENSIVE METABOLIC PANEL
ALT: 33 IU/L (ref 0–44)
AST: 29 IU/L (ref 0–40)
Albumin/Globulin Ratio: 1.4 (ref 1.2–2.2)
Albumin: 4.2 g/dL (ref 3.8–4.9)
Alkaline Phosphatase: 112 IU/L (ref 44–121)
BUN/Creatinine Ratio: 12 (ref 9–20)
BUN: 16 mg/dL (ref 6–24)
Bilirubin Total: 0.4 mg/dL (ref 0.0–1.2)
CO2: 23 mmol/L (ref 20–29)
Calcium: 9.7 mg/dL (ref 8.7–10.2)
Chloride: 104 mmol/L (ref 96–106)
Creatinine, Ser: 1.34 mg/dL — ABNORMAL HIGH (ref 0.76–1.27)
Globulin, Total: 3.1 g/dL (ref 1.5–4.5)
Glucose: 97 mg/dL (ref 70–99)
Potassium: 4.5 mmol/L (ref 3.5–5.2)
Sodium: 140 mmol/L (ref 134–144)
Total Protein: 7.3 g/dL (ref 6.0–8.5)
eGFR: 62 mL/min/{1.73_m2} (ref >59)

## 2022-08-10 LAB — TSH: TSH: 1.14 u[IU]/mL (ref 0.450–4.500)

## 2022-08-10 LAB — HEPATITIS C ANTIBODY: Hep C Virus Ab: NONREACTIVE

## 2022-08-10 LAB — PSA: Prostate Specific Ag, Serum: 1.5 ng/mL (ref 0.0–4.0)

## 2022-08-10 LAB — HIV ANTIBODY (ROUTINE TESTING W REFLEX): HIV Screen 4th Generation wRfx: NONREACTIVE

## 2022-08-19 ENCOUNTER — Other Ambulatory Visit: Payer: Self-pay | Admitting: Family Medicine

## 2022-08-19 DIAGNOSIS — I10 Essential (primary) hypertension: Secondary | ICD-10-CM

## 2022-08-19 NOTE — Telephone Encounter (Signed)
Medication Refill - Medication: losartan (COZAAR) 25 MG tablet   Has the patient contacted their pharmacy? Yes.   (Agent: If no, request that the patient contact the pharmacy for the refill. If patient does not wish to contact the pharmacy document the reason why and proceed with request.) (Agent: If yes, when and what did the pharmacy advise?)  Preferred Pharmacy (with phone number or street name):  [facility] - [place], [place] - [address] Phone: [phone]  Fax: [phone]     Has the patient been seen for an appointment in the last year OR does the patient have an upcoming appointment? Yes.    Agent: Please be advised that RX refills may take up to 3 business days. We ask that you follow-up with your pharmacy.

## 2022-08-20 MED ORDER — LOSARTAN POTASSIUM 25 MG PO TABS: 25.0000 mg | ORAL_TABLET | Freq: Every day | ORAL | 1 refills | Status: DC

## 2022-08-20 NOTE — Telephone Encounter (Signed)
Requested Prescriptions  Pending Prescriptions Disp Refills   losartan (COZAAR) 25 MG tablet 90 tablet 1    Sig: Take 1 tablet (25 mg total) by mouth daily.     Cardiovascular:  Angiotensin Receptor Blockers Failed - 08/19/2022  1:58 PM      Failed - Cr in normal range and within 180 days    Creatinine, Ser  Date Value Ref Range Status  08/09/2022 1.34 (H) 0.76 - 1.27 mg/dL Final         Passed - K in normal range and within 180 days    Potassium  Date Value Ref Range Status  08/09/2022 4.5 3.5 - 5.2 mmol/L Final         Passed - Patient is not pregnant      Passed - Last BP in normal range    BP Readings from Last 1 Encounters:  08/09/22 134/85         Passed - Valid encounter within last 6 months    Recent Outpatient Visits           1 week ago Routine general medical examination at a health care facility   [facility] [place] P, DO   2 months ago Idiopathic chronic gout of right ankle without tophus   [place] [facility] Gabriel Cirri, NP   7 months ago Primary hypertension   [place] [facility] Mecum, Oswaldo Conroy, PA-C   1 year ago Hyperlipidemia, unspecified hyperlipidemia type   [place] [facility] Vigg, Avanti, MD   1 year ago Hyperlipidemia, unspecified hyperlipidemia type   [place] [facility] Loura Pardon, MD       Future Appointments             In 2 weeks Laural Benes, Oralia Rud, DO [place] [facility]

## 2022-08-28 ENCOUNTER — Ambulatory Visit: Payer: Self-pay

## 2022-08-28 NOTE — Telephone Encounter (Signed)
The patient called in stating ever since he ate at [employer] on Monday he just hasn't felt right. He states something was off with the pinto beans. He has had a loss of appetite and diarrhea. Patient was offered an appointment but just wanted to speak with a nurse. Please assist patient further.            Chief Complaint: Ate at [employer] Monday and that night started having diarrhea. Vomited x 2. Still having watery stools, nausea. Drinking liquids to stay hydrated. Declines appointment. Asking if "this just needs to run it's course." No one else in family sick.  Symptoms: Above Frequency: Monday Pertinent Negatives: Patient denies fever Disposition: ED /[] Urgent Care (no appt availability in office) / Appointment(In office/virtual)/  [place] [facility]/ Home Care/ Refused Recommended Disposition /[]  Mobile Bus/  Follow-up with PCP Additional Notes: Please advise pt.  Answer Assessment - Initial Assessment Questions 1. DIARRHEA SEVERITY: "How bad is the diarrhea?" "How many more stools have you had in the past 24 hours than normal?"    - NO DIARRHEA (SCALE 0)   - MILD (SCALE 1-3): Few loose or mushy BMs; increase of 1-3 stools over normal daily number of stools; mild increase in ostomy output.   -  MODERATE (SCALE 4-7): Increase of 4-6 stools daily over normal; moderate increase in ostomy output.   -  SEVERE (SCALE 8-10; OR "WORST POSSIBLE"): Increase of 7 or more stools daily over normal; moderate increase in ostomy output; incontinence.     6-7 2. ONSET: "When did the diarrhea begin?"      Monday 3. BM CONSISTENCY: "How loose or watery is the diarrhea?"      Watery 4. VOMITING: "Are you also vomiting?" If Yes, ask: "How many times in the past 24 hours?"      Early 5. ABDOMEN PAIN: "Are you having any abdomen pain?" If Yes, ask: "What does it feel like?" (e.g., crampy, dull, intermittent, constant)      Sore, cramping 6. ABDOMEN PAIN SEVERITY: If  present, ask: "How bad is the pain?"  (e.g., Scale 1-10; mild, moderate, or severe)   - MILD (1-3): doesn't interfere with normal activities, abdomen soft and not tender to touch    - MODERATE (4-7): interferes with normal activities or awakens from sleep, abdomen tender to touch    - SEVERE (8-10): excruciating pain, doubled over, unable to do any normal activities       Mild 7. ORAL INTAKE: If vomiting, "Have you been able to drink liquids?" "How much liquids have you had in the past 24 hours?"     Yes 8. HYDRATION: "Any signs of dehydration?" (e.g., dry mouth [not just dry lips], too weak to stand, dizziness, new weight loss) "When did you last urinate?"     No 9. EXPOSURE: "Have you traveled to a foreign country recently?" "Have you been exposed to anyone with diarrhea?" "Could you have eaten any food that was spoiled?"     No 10. ANTIBIOTIC USE: "Are you taking antibiotics now or have you taken antibiotics in the past 2 months?"       No 11. OTHER SYMPTOMS: "Do you have any other symptoms?" (e.g., fever, blood in stool)       No 12. PREGNANCY: "Is there any chance you are pregnant?" "When was your last menstrual period?"       N/a  Protocols used: [facility]

## 2022-09-05 ENCOUNTER — Ambulatory Visit (INDEPENDENT_AMBULATORY_CARE_PROVIDER_SITE_OTHER): Payer: BC Managed Care – PPO | Admitting: Family Medicine

## 2022-09-05 ENCOUNTER — Encounter: Payer: Self-pay | Admitting: Family Medicine

## 2022-09-05 VITALS — BP 131/79 | HR 53 | Temp 98.4°F | Ht 67.0 in | Wt 162.0 lb

## 2022-09-05 DIAGNOSIS — M109 Gout, unspecified: Secondary | ICD-10-CM

## 2022-09-05 NOTE — Assessment & Plan Note (Signed)
Tolerating his medicine well. Feeling well. Rechecking labs today. Await results. Adjust dose as needed.

## 2022-09-05 NOTE — Progress Notes (Signed)
BP 131/79   Pulse (!) 53   Temp 98.4 F (36.9 C) (Oral)   Ht  (1.702 m)   Wt 162 lb (73.5 kg)   SpO2 99%   BMI 25.37 kg/m    Subjective:    Patient ID: Sean Ade., male    DOB: 1966-04-29, 57 y.o.   MRN: [id number]  HPI: Sean Trott. is a 57 y.o. male  Chief Complaint  Patient presents with   Gout   GOUT Duration: chronic Right 1st metatarsophalangeal pain: no Left 1st metatarsophalangeal pain: no Right knee pain: yes Left knee pain: no Severity: mild  Quality: sharp and aching Swelling: yes Redness: yes Trauma: no Recent dietary change or indiscretion: no Fevers: no Nausea/vomiting: no Aggravating factors: Alleviating factors:  Status:  better Treatments attempted:   Relevant past medical, surgical, family and social history reviewed and updated as indicated. Interim medical history since our last visit reviewed. Allergies and medications reviewed and updated.  Review of Systems  Constitutional: Negative.   Respiratory: Negative.    Cardiovascular: Negative.   Gastrointestinal: Negative.   Musculoskeletal: Negative.   Neurological: Negative.   Psychiatric/Behavioral: Negative.      Per HPI unless specifically indicated above     Objective:    BP 131/79   Pulse (!) 53   Temp 98.4 F (36.9 C) (Oral)   Ht  (1.702 m)   Wt 162 lb (73.5 kg)   SpO2 99%   BMI 25.37 kg/m   Wt Readings from Last 3 Encounters:  09/05/22 162 lb (73.5 kg)  08/09/22 166 lb 11.2 oz (75.6 kg)  06/04/22 168 lb 14.4 oz (76.6 kg)    Physical Exam Vitals and nursing note reviewed.  Constitutional:      General: He is not in acute distress.    Appearance: Normal appearance. He is not ill-appearing, toxic-appearing or diaphoretic.  HENT:     Head: Normocephalic and atraumatic.     Right Ear: External ear normal.     Left Ear: External ear normal.     Nose: Nose normal.     Mouth/Throat:     Mouth: Mucous membranes are moist.     Pharynx:  Oropharynx is clear.  Eyes:     General: No scleral icterus.       Right eye: No discharge.        Left eye: No discharge.     Extraocular Movements: Extraocular movements intact.     Conjunctiva/sclera: Conjunctivae normal.     Pupils: Pupils are equal, round, and reactive to light.  Cardiovascular:     Rate and Rhythm: Normal rate and regular rhythm.     Pulses: Normal pulses.     Heart sounds: Normal heart sounds. No murmur heard.    No friction rub. No gallop.  Pulmonary:     Effort: Pulmonary effort is normal. No respiratory distress.     Breath sounds: Normal breath sounds. No stridor. No wheezing, rhonchi or rales.  Chest:     Chest wall: No tenderness.  Musculoskeletal:        General: Normal range of motion.     Cervical back: Normal range of motion and neck supple.     Comments: Swelling in the prepateallar bursa on the R with mild redness, no heat  Skin:    General: Skin is warm and dry.     Capillary Refill: Capillary refill takes less than 2 seconds.     Coloration: Skin  is not jaundiced or pale.     Findings: No bruising, erythema, lesion or rash.  Neurological:     General: No focal deficit present.     Mental Status: He is alert and oriented to person, place, and time. Mental status is at baseline.  Psychiatric:        Mood and Affect: Mood normal.        Behavior: Behavior normal.        Thought Content: Thought content normal.        Judgment: Judgment normal.     Results for orders placed or performed in visit on 08/09/22  Microscopic Examination   Urine  Result Value Ref Range   WBC, UA 0-5 0 - 5 /hpf   RBC, Urine 0-2 0 - 2 /hpf   Epithelial Cells (non renal) None seen 0 - 10 /hpf   Bacteria, UA None seen None seen/Few  Comprehensive metabolic panel  Result Value Ref Range   Glucose 97 70 - 99 mg/dL   BUN 16 6 - 24 mg/dL   Creatinine, Ser 2.72 (H) 0.76 - 1.27 mg/dL   eGFR 62 >53 GU/YQI/3.47   BUN/Creatinine Ratio 12 9 - 20   Sodium 140 134 -  144 mmol/L   Potassium 4.5 3.5 - 5.2 mmol/L   Chloride 104 96 - 106 mmol/L   CO2 23 20 - 29 mmol/L   Calcium 9.7 8.7 - 10.2 mg/dL   Total Protein 7.3 6.0 - 8.5 g/dL   Albumin 4.2 3.8 - 4.9 g/dL   Globulin, Total 3.1 1.5 - 4.5 g/dL   Albumin/Globulin Ratio 1.4 1.2 - 2.2   Bilirubin Total 0.4 0.0 - 1.2 mg/dL   Alkaline Phosphatase 112 44 - 121 IU/L   AST 29 0 - 40 IU/L   ALT 33 0 - 44 IU/L  CBC with Differential/Platelet  Result Value Ref Range   WBC 5.8 3.4 - 10.8 x10E3/uL   RBC 5.10 4.14 - 5.80 x10E6/uL   Hemoglobin 15.0 13.0 - 17.7 g/dL   Hematocrit 42.5 95.6 - 51.0 %   MCV 85 79 - 97 fL   MCH 29.4 26.6 - 33.0 pg   MCHC 34.7 31.5 - 35.7 g/dL   RDW 38.7 56.4 - 33.2 %   Platelets 278 150 - 450 x10E3/uL   Neutrophils 61 Not Estab. %   Lymphs 29 Not Estab. %   Monocytes 8 Not Estab. %   Eos 1 Not Estab. %   Basos 1 Not Estab. %   Neutrophils Absolute 3.5 1.4 - 7.0 x10E3/uL   Lymphocytes Absolute 1.7 0.7 - 3.1 x10E3/uL   Monocytes Absolute 0.5 0.1 - 0.9 x10E3/uL   EOS (ABSOLUTE) 0.1 0.0 - 0.4 x10E3/uL   Basophils Absolute 0.1 0.0 - 0.2 x10E3/uL   Immature Granulocytes 0 Not Estab. %   Immature Grans (Abs) 0.0 0.0 - 0.1 x10E3/uL  Lipid Panel w/o Chol/HDL Ratio  Result Value Ref Range   Cholesterol, Total 207 (H) 100 - 199 mg/dL   Triglycerides 951 0 - 149 mg/dL   HDL 44 >88 mg/dL   VLDL Cholesterol Cal 22 5 - 40 mg/dL   LDL Chol Calc (NIH) 416 (H) 0 - 99 mg/dL  PSA  Result Value Ref Range   Prostate Specific Ag, Serum 1.5 0.0 - 4.0 ng/mL  TSH  Result Value Ref Range   TSH 1.140 0.450 - 4.500 uIU/mL  Urinalysis, Routine w reflex microscopic  Result Value Ref Range   Specific Gravity, UA  1.020 1.005 - 1.030   pH, UA 6.5 5.0 - 7.5   Color, UA Yellow Yellow   Appearance Ur Clear Clear   Leukocytes,UA Negative Negative   Protein,UA Negative Negative/Trace   Glucose, UA Negative Negative   Ketones, UA Negative Negative   RBC, UA Trace (A) Negative   Bilirubin, UA  Negative Negative   Urobilinogen, Ur 0.2 0.2 - 1.0 mg/dL   Nitrite, UA Negative Negative   Microscopic Examination See below:   Microalbumin, Urine Waived  Result Value Ref Range   Microalb, Ur Waived 30 (H) 0 - 19 mg/L   Creatinine, Urine Waived 50 10 - 300 mg/dL   Microalb/Creat Ratio 30-300 (H) <30 mg/g  Uric acid  Result Value Ref Range   Uric Acid 7.5 3.8 - 8.4 mg/dL  HIV Antibody (routine testing w rflx)  Result Value Ref Range   HIV Screen 4th Generation wRfx Non Reactive Non Reactive  Hepatitis C Antibody  Result Value Ref Range   Hep C Virus Ab Non Reactive Non Reactive      Assessment & Plan:   Problem List Items Addressed This Visit       Other   Gout - Primary    Tolerating his medicine well. Feeling well. Rechecking labs today. Await results. Adjust dose as needed.       Relevant Orders   Uric acid   Comprehensive metabolic panel     Follow up plan: Return in about 5 months (around 02/05/2023).

## 2022-09-06 ENCOUNTER — Other Ambulatory Visit: Payer: Self-pay | Admitting: Family Medicine

## 2022-09-06 LAB — COMPREHENSIVE METABOLIC PANEL
ALT: 35 IU/L (ref 0–44)
AST: 30 IU/L (ref 0–40)
Albumin/Globulin Ratio: 1.7 (ref 1.2–2.2)
Albumin: 4.1 g/dL (ref 3.8–4.9)
Alkaline Phosphatase: 88 IU/L (ref 44–121)
BUN/Creatinine Ratio: 10 (ref 9–20)
BUN: 14 mg/dL (ref 6–24)
Bilirubin Total: 0.3 mg/dL (ref 0.0–1.2)
CO2: 25 mmol/L (ref 20–29)
Calcium: 9.1 mg/dL (ref 8.7–10.2)
Chloride: 105 mmol/L (ref 96–106)
Creatinine, Ser: 1.38 mg/dL — ABNORMAL HIGH (ref 0.76–1.27)
Globulin, Total: 2.4 g/dL (ref 1.5–4.5)
Glucose: 90 mg/dL (ref 70–99)
Potassium: 4.1 mmol/L (ref 3.5–5.2)
Sodium: 141 mmol/L (ref 134–144)
Total Protein: 6.5 g/dL (ref 6.0–8.5)
eGFR: 60 mL/min/{1.73_m2} (ref >59)

## 2022-09-06 LAB — URIC ACID: Uric Acid: 6.9 mg/dL (ref 3.8–8.4)

## 2022-09-06 MED ORDER — FEBUXOSTAT 40 MG PO TABS: 40.0000 mg | ORAL_TABLET | Freq: Every day | ORAL | 1 refills | Status: DC

## 2022-09-16 ENCOUNTER — Ambulatory Visit
Admission: EM | Admit: 2022-09-16 | Discharge: 2022-09-16 | Disposition: A | Discharge status: Home / Self Care | Payer: BC Managed Care – PPO | Attending: Urgent Care | Admitting: Urgent Care

## 2022-09-16 ENCOUNTER — Ambulatory Visit: Payer: Self-pay

## 2022-09-16 DIAGNOSIS — M1A09X0 Idiopathic chronic gout, multiple sites, without tophus (tophi): Secondary | ICD-10-CM

## 2022-09-16 MED ORDER — PREDNISONE 20 MG PO TABS: ORAL_TABLET | ORAL | 0 refills | Status: AC

## 2022-09-16 NOTE — ED Triage Notes (Signed)
Patient presents to UC for gout flare-up from febuxostat medication. Gout flare-up to right ankle, left elbow and left wrist. He states he started taking the medication and also noted decreased appetite and po intake. Not taking any OTC meds. He did start back up his colchicine.

## 2022-09-16 NOTE — Telephone Encounter (Signed)
  Chief Complaint: fatigue, mild sob, gout increasing Symptoms: above Frequency: since starting new medication Pertinent Negatives: Patient denies  Disposition: [] ED /[] Urgent Care (no appt availability in office) / [x] Appointment(In office/virtual)/ []  [facility]/ [] Home Care/ [] Refused Recommended Disposition /[]  Mobile Bus/ []  Follow-up with PCP Additional Notes: PT states that since starting new gout medication he has had a lot of fatigue. Yesterday he laid on the couch the whole afternoon.  He also states that he has mild SOB and gout is now in 3 places.    Reason for Disposition  [1] MODERATE weakness (i.e., interferes with work, school, normal activities) AND [2] cause unknown  (Exceptions: Weakness from acute minor illness or poor fluid intake; weakness is chronic and not worse.)  Answer Assessment - Initial Assessment Questions 1. DESCRIPTION: "Describe how you are feeling."     Like he could sleep all the time 2. SEVERITY: "How bad is it?"  "Can you stand and walk?"   - MILD (0-3): Feels weak or tired, but does not interfere with work, school or normal activities.   - MODERATE (4-7): Able to stand and walk; weakness interferes with work, school, or normal activities.   - SEVERE (8-10): Unable to stand or walk; unable to do usual activities.     moderate 3. ONSET: "When did these symptoms begin?" (e.g., hours, days, weeks, months)     Past few days 4. CAUSE: "What do you think is causing the weakness or fatigue?" (e.g., not drinking enough fluids, medical problem, trouble sleeping)      5. NEW MEDICINES:  "Have you started on any new medicines recently?" (e.g., opioid pain medicines, benzodiazepines, muscle relaxants, antidepressants, antihistamines, neuroleptics, beta blockers)     yes 6. OTHER SYMPTOMS: "Do you have any other symptoms?" (e.g., chest pain, fever, cough, SOB, vomiting, diarrhea, bleeding, other areas of pain)     SOB, Gout  Protocols  used: Weakness (Generalized) and Fatigue-A-AH

## 2022-09-16 NOTE — Discharge Instructions (Addendum)
Follow up with your primary care provider.

## 2022-09-16 NOTE — ED Provider Notes (Signed)
Sean Delacruz    CSN: [id number] Arrival date & time: 09/16/22  1645      History   Chief Complaint Chief Complaint  Patient presents with   Ankle Pain    HPI Sean Delacruz. is a 57 y.o. male.    Ankle Pain   Presents to urgent care for gout flareup which he believes is secondary to febuxostat medication use.  He endorses pain in his right ankle, left elbow, left wrist.  He also complains of profound fatigue and body weakness which she states started when he started this medication sometime back.  Past Medical History:  Diagnosis Date   Allergic rhinitis    Dysphagia    Gout    History of cellulitis 11/2013 and 8/15   Hyperlipidemia    Hypertension     Patient Active Problem List   Diagnosis Date Noted   History of CVA (cerebrovascular accident) 12/18/2020   Infrapatellar bursitis of left knee 06/13/2015   Hx of melanoma of skin 01/31/2015   Hyperlipidemia    Hypertension    Gout    Dysphagia     Past Surgical History:  Procedure Laterality Date   HIATAL HERNIA REPAIR     x3   KNEE ARTHROPLASTY     KNEE SURGERY         Home Medications    Prior to Admission medications   Medication Sig Start Date End Date Taking? Authorizing Provider  aspirin 81 MG EC tablet Take 1 tablet (81 mg total) by mouth daily. Swallow whole. 11/14/20   Vigg, Avanti, MD  colchicine 0.6 MG tablet Take 1 tablet (0.6 mg total) by mouth daily as needed. 07/17/22 09/15/22  Olevia Perches P, DO  febuxostat (ULORIC) 40 MG tablet Take 1 tablet (40 mg total) by mouth daily. 09/06/22   Johnson, Megan P, DO  indomethacin (INDOCIN) 50 MG capsule Take 1 capsule (50 mg total) by mouth 3 (three) times daily with meals. 06/04/22   Gabriel Cirri, NP  losartan (COZAAR) 25 MG tablet Take 1 tablet (25 mg total) by mouth daily. 08/20/22   Dorcas Carrow, DO    Family History Family History  Problem Relation Age of Onset   Hypertension Father    Diabetes Father    Stroke Mother     Hypertension Paternal Grandmother    Myasthenia gravis Other        grandfather   Cancer Maternal Grandmother     Social History Social History   Tobacco Use   Smoking status: Former    Packs/day: 1.25    Years: 8.00    Additional pack years: 0.00    Total pack years: 10.00    Types: Cigarettes    Quit date: 05/13/1996    Years since quitting: 26.3   Smokeless tobacco: Never  Vaping Use   Vaping Use: Never used  Substance Use Topics   Alcohol use: No   Drug use: No     Allergies   Patient has no known allergies.   Review of Systems Review of Systems   Physical Exam Triage Vital Signs ED Triage Vitals  Enc Vitals Group     BP 09/16/22 1746 115/77     Pulse Rate 09/16/22 1746 67     Resp 09/16/22 1746 18     Temp 09/16/22 1746 98.7 F (37.1 C)     Temp Source 09/16/22 1746 Oral     SpO2 09/16/22 1746 96 %     Weight --  Height --      Head Circumference --      Peak Flow --      Pain Score 09/16/22 1737 8     Pain Loc --      Pain Edu? --      Excl. in GC? --    No data found.  Updated Vital Signs BP 115/77 (BP Location: Right Arm)   Pulse 67   Temp 98.7 F (37.1 C) (Oral)   Resp 18   SpO2 96%   Visual Acuity Right Eye Distance:   Left Eye Distance:   Bilateral Distance:    Right Eye Near:   Left Eye Near:    Bilateral Near:     Physical Exam Vitals reviewed.  Constitutional:      Appearance: Normal appearance.  Musculoskeletal:       Arms:       Feet:  Skin:    General: Skin is warm and dry.  Neurological:     General: No focal deficit present.     Mental Status: He is alert and oriented to person, place, and time.  Psychiatric:        Mood and Affect: Mood normal.        Behavior: Behavior normal.      UC Treatments / Results  Labs (all labs ordered are listed, but only abnormal results are displayed) Labs Reviewed - No data to display  EKG   Radiology No results found.  Procedures Procedures (including  critical care time)  Medications Ordered in UC Medications - No data to display  Initial Impression / Assessment and Plan / UC Course  I have reviewed the triage vital signs and the nursing notes.  Pertinent labs & imaging results that were available during my care of the patient were reviewed by me and considered in my medical decision making (see chart for details).   Likely gout flare, idiopathic.  He has been using Uloric for at least several weeks with recent reassuring uric acid levels.  Will treat today with prednisone and have him follow-up with his PCP.  Counseled patient on potential for adverse effects with medications prescribed/recommended today, ER and return-to-clinic precautions discussed, patient verbalized understanding and agreement with care plan.   Final Clinical Impressions(s) / UC Diagnoses   Final diagnoses:  None   Discharge Instructions   None    ED Prescriptions   None    PDMP not reviewed this encounter.   Charma Igo, [place] 09/16/22 1806

## 2022-09-17 ENCOUNTER — Ambulatory Visit: Payer: BC Managed Care – PPO | Admitting: Family Medicine

## 2022-09-17 ENCOUNTER — Ambulatory Visit: Payer: Self-pay | Admitting: *Deleted

## 2022-09-17 ENCOUNTER — Encounter: Payer: Self-pay | Admitting: Family Medicine

## 2022-09-17 VITALS — BP 111/70 | HR 82 | Temp 98.1°F | Ht 67.0 in | Wt 158.9 lb

## 2022-09-17 DIAGNOSIS — S1086XD Insect bite of other specified part of neck, subsequent encounter: Secondary | ICD-10-CM

## 2022-09-17 DIAGNOSIS — Z1211 Encounter for screening for malignant neoplasm of colon: Secondary | ICD-10-CM

## 2022-09-17 DIAGNOSIS — W57XXXD Bitten or stung by nonvenomous insect and other nonvenomous arthropods, subsequent encounter: Secondary | ICD-10-CM

## 2022-09-17 DIAGNOSIS — R5383 Other fatigue: Secondary | ICD-10-CM

## 2022-09-17 DIAGNOSIS — R1312 Dysphagia, oropharyngeal phase: Secondary | ICD-10-CM

## 2022-09-17 MED ORDER — DOXYCYCLINE HYCLATE 100 MG PO TABS: 100.0000 mg | ORAL_TABLET | Freq: Two times a day (BID) | ORAL | 0 refills | Status: DC

## 2022-09-17 NOTE — Telephone Encounter (Signed)
Patient was seen at [facility] yesterday. Patient was seen for medication SE- Patient had started a new medication for gout. Patient was assessed- and given prednisone- he feels he wasted time.Febuxostat has been stopped- but patient is not sure if he should take the prednisone and colchine together. Patient advised to keep appointment today- may need to discuss other options for gout control. Reason for Disposition  [1] Caller has URGENT medicine question about med that PCP or specialist prescribed AND [2] triager unable to answer question  Answer Assessment - Initial Assessment Questions 1. NAME of MEDICINE: "What medicine(s) are you calling about?"     Febuxostat- SE/reaction- fatigue,pain in joints- every reaction listed- he had it. 2. QUESTION: "What is your question?" (e.g., double dose of medicine, side effect)     Patient was seen at [facility] and given Prednisone- he wants to know if he should take it with his other gout medication. 3. PRESCRIBER: "Who prescribed the medicine?" Reason: if prescribed by specialist, call should be referred to that group.     PCP/UC provider 4. SYMPTOMS: "Do you have any symptoms?" If Yes, ask: "What symptoms are you having?"  "How bad are the symptoms (e.g., mild, moderate, severe)     Fatigue, pain in joints  Protocols used: Medication Question Call-A-AH

## 2022-09-17 NOTE — Telephone Encounter (Signed)
Fatigue   The patient called back in stating he is still fatigued and knows he has an appt this afternoon. He just would like to speak with a nurse before his appt. Please assist further as he says this is stemming from medicine he is taking for gout.

## 2022-09-17 NOTE — Progress Notes (Unsigned)
BP 111/70   Pulse 82   Temp 98.1 F (36.7 C) (Oral)   Ht 5\' 7"  (1.702 m)   Wt 158 lb 14.4 oz (72.1 kg)   SpO2 97%   BMI 24.89 kg/m    Subjective:    Patient ID: Sean Ade., male    DOB: 10-08-1965, 57 y.o.   MRN: [id number]  HPI: Sean Pastrana. is a 57 y.o. male  Chief Complaint  Patient presents with   Gout    Patient says he was prescribed Ulroic and says he noticed a change in his appetite and he says he would like to discuss with the provider at today's visit. Patient says he has stopped taking it Saturday. Patient says he feels as if his throat is closing up and he can feel his food digesting.    Had a decrease in appetite about 2 weeks ago. He notes that he also had been quite tired. He notes that he was really tired and had been SOB. He notes that he went to the walk in yesterday and was started on some steroids yesterday. His joints are feeling better. He notes that he had a tick bite about 4-6 weeks ago which started all of this. He notes that he has not been feeling well in general and has been having joint pain all over his body. He went to the walk in and they noted that his uric acid was good, but gave him some steroids. He does note that he has been having trouble swallowing- has been feeling like his pills are getting stuck. He has had his throat stretched before, but that was back several years ago. No other concerns or complaints at this time.   Relevant past medical, surgical, family and social history reviewed and updated as indicated. Interim medical history since our last visit reviewed. Allergies and medications reviewed and updated.  Review of Systems  Constitutional:  Positive for fatigue. Negative for activity change, appetite change, chills, diaphoresis, fever and unexpected weight change.  Respiratory: Negative.    Cardiovascular: Negative.   Gastrointestinal: Negative.   Musculoskeletal:  Positive for arthralgias and myalgias. Negative for  back pain, gait problem, joint swelling, neck pain and neck stiffness.  Skin: Negative.   Neurological: Negative.   Psychiatric/Behavioral: Negative.      Per HPI unless specifically indicated above     Objective:    BP 111/70   Pulse 82   Temp 98.1 F (36.7 C) (Oral)   Ht 5\' 7"  (1.702 m)   Wt 158 lb 14.4 oz (72.1 kg)   SpO2 97%   BMI 24.89 kg/m   Wt Readings from Last 3 Encounters:  09/17/22 158 lb 14.4 oz (72.1 kg)  09/05/22 162 lb (73.5 kg)  08/09/22 166 lb 11.2 oz (75.6 kg)    Physical Exam Vitals and nursing note reviewed.  Constitutional:      General: He is not in acute distress.    Appearance: Normal appearance. He is not ill-appearing, toxic-appearing or diaphoretic.  HENT:     Head: Normocephalic and atraumatic.     Right Ear: External ear normal.     Left Ear: External ear normal.     Nose: Nose normal.     Mouth/Throat:     Mouth: Mucous membranes are moist.     Pharynx: Oropharynx is clear.  Eyes:     General: No scleral icterus.       Right eye: No discharge.  Left eye: No discharge.     Extraocular Movements: Extraocular movements intact.     Conjunctiva/sclera: Conjunctivae normal.     Pupils: Pupils are equal, round, and reactive to light.  Cardiovascular:     Rate and Rhythm: Normal rate and regular rhythm.     Pulses: Normal pulses.     Heart sounds: Normal heart sounds. No murmur heard.    No friction rub. No gallop.  Pulmonary:     Effort: Pulmonary effort is normal. No respiratory distress.     Breath sounds: Normal breath sounds. No stridor. No wheezing, rhonchi or rales.  Chest:     Chest wall: No tenderness.  Musculoskeletal:        General: Normal range of motion.     Cervical back: Normal range of motion and neck supple.  Skin:    General: Skin is warm and dry.     Capillary Refill: Capillary refill takes less than 2 seconds.     Coloration: Skin is not jaundiced or pale.     Findings: No bruising, erythema, lesion or  rash.  Neurological:     General: No focal deficit present.     Mental Status: He is alert and oriented to person, place, and time. Mental status is at baseline.  Psychiatric:        Mood and Affect: Mood normal.        Behavior: Behavior normal.        Thought Content: Thought content normal.        Judgment: Judgment normal.     Results for orders placed or performed in visit on 09/17/22  CBC with Differential/Platelet  Result Value Ref Range   WBC 16.4 (H) 3.4 - 10.8 x10E3/uL   RBC 5.04 4.14 - 5.80 x10E6/uL   Hemoglobin 14.6 13.0 - 17.7 g/dL   Hematocrit 81.1 91.4 - 51.0 %   MCV 85 79 - 97 fL   MCH 29.0 26.6 - 33.0 pg   MCHC 34.2 31.5 - 35.7 g/dL   RDW 78.2 95.6 - 21.3 %   Platelets 339 150 - 450 x10E3/uL   Neutrophils 90 Not Estab. %   Lymphs 5 Not Estab. %   Monocytes 4 Not Estab. %   Eos 0 Not Estab. %   Basos 0 Not Estab. %   Neutrophils Absolute 15.0 (H) 1.4 - 7.0 x10E3/uL   Lymphocytes Absolute 0.7 0.7 - 3.1 x10E3/uL   Monocytes Absolute 0.6 0.1 - 0.9 x10E3/uL   EOS (ABSOLUTE) 0.0 0.0 - 0.4 x10E3/uL   Basophils Absolute 0.0 0.0 - 0.2 x10E3/uL   Immature Granulocytes 1 Not Estab. %   Immature Grans (Abs) 0.1 0.0 - 0.1 x10E3/uL  Comprehensive metabolic panel  Result Value Ref Range   Glucose 157 (H) 70 - 99 mg/dL   BUN 18 6 - 24 mg/dL   Creatinine, Ser 0.86 0.76 - 1.27 mg/dL   eGFR 70 >57 QI/ONG/2.95   BUN/Creatinine Ratio 15 9 - 20   Sodium 138 134 - 144 mmol/L   Potassium 4.2 3.5 - 5.2 mmol/L   Chloride 104 96 - 106 mmol/L   CO2 18 (L) 20 - 29 mmol/L   Calcium 9.5 8.7 - 10.2 mg/dL   Total Protein 7.1 6.0 - 8.5 g/dL   Albumin 4.0 3.8 - 4.9 g/dL   Globulin, Total 3.1 1.5 - 4.5 g/dL   Albumin/Globulin Ratio 1.3 1.2 - 2.2   Bilirubin Total 0.4 0.0 - 1.2 mg/dL   Alkaline Phosphatase 99 44 -  121 IU/L   AST 19 0 - 40 IU/L   ALT 25 0 - 44 IU/L  VITAMIN D 25 Hydroxy (Vit-D Deficiency, Fractures)  Result Value Ref Range   Vit D, 25-Hydroxy 25.4 (L) 30.0 -  100.0 ng/mL  Lyme Disease Serology w/Reflex  Result Value Ref Range   Lyme Total Antibody EIA Negative Negative  Spotted Fever Group Antibodies  Result Value Ref Range   Spotted Fever Group IgG WILL FOLLOW    Spotted Fever Group IgM WILL FOLLOW    Result Comment Comment   Ehrlichia Antibody Panel  Result Value Ref Range   E.Chaffeensis (HME) IgG Negative Neg:<1:64   E. Chaffeensis (HME) IgM Titer Negative Neg:<1:20   HGE IgG Titer Negative Neg:<1:64   HGE IgM Titer Negative Neg:<1:20   Result Comment: Comment       Assessment & Plan:   Problem List Items Addressed This Visit       Digestive   Dysphagia - Primary    Referral to GI placed today. Await their input.       Relevant Orders   Ambulatory referral to Gastroenterology   Other Visit Diagnoses     Fatigue, unspecified type       Concern for tick disease. Will check labs. Hold uloric for now   Relevant Orders   CBC with Differential/Platelet (Completed)   Comprehensive metabolic panel (Completed)   VITAMIN D 25 Hydroxy (Vit-D Deficiency, Fractures) (Completed)   Lyme Disease Serology w/Reflex (Completed)   Spotted Fever Group Antibodies (Completed)   Ehrlichia Antibody Panel (Completed)   Tick bite of other part of neck, subsequent encounter       Checking labs today. Await results. Treat as needed.   Relevant Orders   Lyme Disease Serology w/Reflex (Completed)   Spotted Fever Group Antibodies (Completed)   Ehrlichia Antibody Panel (Completed)   Screening for colon cancer       Referral to GI placed today.   Relevant Orders   Ambulatory referral to Gastroenterology        Follow up plan: Return in about 4 weeks (around 10/15/2022).

## 2022-09-17 NOTE — Telephone Encounter (Signed)
  Chief Complaint: medication question Symptoms: recent SE to new gout medication- has stopped- but was given prednisone- patient is unsure what medications to take.   Disposition: [] ED /[] Urgent Care (no appt availability in office) / [] Appointment(In office/virtual)/ []  [place]/ [] Home Care/ [] Refused Recommended Disposition /[] [place] [facility]/ [x]  Follow-up with PCP Additional Notes: Patient does have appointment this afternoon and wants to know if he should keep it- due to symptoms and gout flare- advised yes- can discuss medication management and options.

## 2022-09-18 LAB — COMPREHENSIVE METABOLIC PANEL
Albumin: 4 g/dL (ref 3.8–4.9)
Alkaline Phosphatase: 99 IU/L (ref 44–121)
Bilirubin Total: 0.4 mg/dL (ref 0.0–1.2)
Chloride: 104 mmol/L (ref 96–106)
Sodium: 138 mmol/L (ref 134–144)
Total Protein: 7.1 g/dL (ref 6.0–8.5)
eGFR: 70 mL/min/{1.73_m2} (ref >59)

## 2022-09-18 LAB — CBC WITH DIFFERENTIAL/PLATELET
Basos: 0 %
Immature Grans (Abs): 0.1 10*3/uL (ref 0.0–0.1)
Lymphocytes Absolute: 0.7 10*3/uL (ref 0.7–3.1)
MCV: 85 fL (ref 79–97)
Monocytes Absolute: 0.6 10*3/uL (ref 0.1–0.9)
Monocytes: 4 %
Neutrophils: 90 %

## 2022-09-18 LAB — EHRLICHIA ANTIBODY PANEL
HGE IgG Titer: NEGATIVE
HGE IgM Titer: NEGATIVE

## 2022-09-18 LAB — SPOTTED FEVER GROUP ANTIBODIES

## 2022-09-19 ENCOUNTER — Encounter: Payer: Self-pay | Admitting: Family Medicine

## 2022-09-19 NOTE — Assessment & Plan Note (Signed)
Referral to GI placed today. Await their input.

## 2022-09-20 ENCOUNTER — Ambulatory Visit: Payer: BC Managed Care – PPO | Admitting: Family Medicine

## 2022-09-24 LAB — COMPREHENSIVE METABOLIC PANEL
ALT: 25 IU/L (ref 0–44)
AST: 19 IU/L (ref 0–40)
Albumin/Globulin Ratio: 1.3 (ref 1.2–2.2)
BUN/Creatinine Ratio: 15 (ref 9–20)
BUN: 18 mg/dL (ref 6–24)
CO2: 18 mmol/L — ABNORMAL LOW (ref 20–29)
Calcium: 9.5 mg/dL (ref 8.7–10.2)
Creatinine, Ser: 1.21 mg/dL (ref 0.76–1.27)
Globulin, Total: 3.1 g/dL (ref 1.5–4.5)
Glucose: 157 mg/dL — ABNORMAL HIGH (ref 70–99)
Potassium: 4.2 mmol/L (ref 3.5–5.2)

## 2022-09-24 LAB — CBC WITH DIFFERENTIAL/PLATELET
Basophils Absolute: 0 10*3/uL (ref 0.0–0.2)
EOS (ABSOLUTE): 0 10*3/uL (ref 0.0–0.4)
Eos: 0 %
Hematocrit: 42.7 % (ref 37.5–51.0)
Hemoglobin: 14.6 g/dL (ref 13.0–17.7)
Immature Granulocytes: 1 %
Lymphs: 5 %
MCH: 29 pg (ref 26.6–33.0)
MCHC: 34.2 g/dL (ref 31.5–35.7)
Neutrophils Absolute: 15 10*3/uL — ABNORMAL HIGH (ref 1.4–7.0)
Platelets: 339 10*3/uL (ref 150–450)
RBC: 5.04 x10E6/uL (ref 4.14–5.80)
RDW: 12.9 % (ref 11.6–15.4)
WBC: 16.4 10*3/uL — ABNORMAL HIGH (ref 3.4–10.8)

## 2022-09-24 LAB — SPOTTED FEVER GROUP ANTIBODIES: Spotted Fever Group IgG: <1:64 {titer}

## 2022-09-24 LAB — EHRLICHIA ANTIBODY PANEL
E. Chaffeensis (HME) IgM Titer: NEGATIVE
E.Chaffeensis (HME) IgG: NEGATIVE

## 2022-09-24 LAB — LYME DISEASE SEROLOGY W/REFLEX: Lyme Total Antibody EIA: NEGATIVE

## 2022-09-24 LAB — VITAMIN D 25 HYDROXY (VIT D DEFICIENCY, FRACTURES): Vit D, 25-Hydroxy: 25.4 ng/mL — ABNORMAL LOW (ref 30.0–100.0)

## 2022-10-01 NOTE — Progress Notes (Unsigned)
[name], PA-C [address]  [address]  [place], [place] [address]  Main: [phone]  Fax: [phone]   Gastroenterology Consultation  Referring Provider:     Dorcas Carrow, DO Primary Care Physician:  Dorcas Carrow, DO Primary Gastroenterologist:  Dr. Midge Minium / [name], PA-C  Reason for Consultation:     Dysphagia, Screening Colonoscopy        HPI:   Sean Delacruz. is a 57 y.o. y/o male referred for consultation & management  by Dorcas Carrow, DO.   Patient has long history of acid reflux, and dysphagia.  Previous Nissen fundoplication.  He reports recurrent solid food dysphagia and acid reflux symptoms for 1 month.  Denies weight loss, food bolus, or vomiting episodes.  He is currently taking OTC Nexium 20 Mg once daily.  He avoids GERD trigger foods and does not eat close to bedtime.  Had Nissen fundoplication surgery around 2000.  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  He saw Dr. Janyth Contes at [facility] who did a Nissen wrap reversal prior to 2010.  He had complications after that surgery.  EGD 11/2013 by Dr. Raylene Miyamoto at [facility] showed LA grade D esophagitis in the lower third of esophagus.  Bilious fluid in the gastric fundus.  Loose wrap at the GE junction.  No biopsies.  It was recommended to take PPI twice daily indefinitely.  Between 2015 and up until 1 month ago he was doing well.  He has had recurrent upper GI symptoms for 1 month.  EGD 01/2011 done at [facility] (to evaluate dysphagia) showed reflux esophagitis.  No Barrett's.  Remote history of Nissen fundoplication.  Esophageal manometry 07/2012 at [facility] (Dr. Lacretia Nicks) showed markedly hypotensive lower esophageal sphincter.  Mild peristaltic dysfunction.    Colonoscopy was before 2010, report is unavailable.  He is overdue for repeat screening colonoscopy.  He denies any lower GI symptoms.  Past Medical History:  Diagnosis Date   Allergic rhinitis    Dysphagia     Gout    History of cellulitis 11/2013 and 8/15   Hyperlipidemia    Hypertension     Past Surgical History:  Procedure Laterality Date   HIATAL HERNIA REPAIR     x3   KNEE ARTHROPLASTY     KNEE SURGERY      Prior to Admission medications   Medication Sig Start Date End Date Taking? Authorizing Provider  aspirin 81 MG EC tablet Take 1 tablet (81 mg total) by mouth daily. Swallow whole. 11/14/20   Vigg, Avanti, MD  colchicine 0.6 MG tablet Take 1 tablet (0.6 mg total) by mouth daily as needed. 07/17/22 09/15/22  Olevia Perches P, DO  doxycycline (VIBRA-TABS) 100 MG tablet Take 1 tablet (100 mg total) by mouth 2 (two) times daily. 09/17/22   Johnson, Megan P, DO  febuxostat (ULORIC) 40 MG tablet Take 1 tablet (40 mg total) by mouth daily. Patient not taking: Reported on 09/17/2022 09/06/22   Olevia Perches P, DO  indomethacin (INDOCIN) 50 MG capsule Take 1 capsule (50 mg total) by mouth 3 (three) times daily with meals. 06/04/22   Gabriel Cirri, NP  losartan (COZAAR) 25 MG tablet Take 1 tablet (25 mg total) by mouth daily. 08/20/22   Dorcas Carrow, DO    Family History  Problem Relation Age of Onset   Hypertension Father    Diabetes Father    Stroke Mother  Hypertension Paternal Grandmother    Myasthenia gravis Other        grandfather   Cancer Maternal Grandmother      Social History   Tobacco Use   Smoking status: Former    Packs/day: 1.25    Years: 8.00    Additional pack years: 0.00    Total pack years: 10.00    Types: Cigarettes    Quit date: 05/13/1996    Years since quitting: 26.4   Smokeless tobacco: Never  Vaping Use   Vaping Use: Never used  Substance Use Topics   Alcohol use: No   Drug use: No    Allergies as of 10/02/2022   (No Known Allergies)    Review of Systems:    All systems reviewed and negative except where noted in HPI.   Physical Exam:  BP (!) 137/90   Pulse 60   Temp 98.3 F (36.8 C)   Ht 5\' 9"  (1.753 m)   Wt 160 lb 9.6 oz (72.8 kg)    BMI 23.72 kg/m  No LMP for male patient. Psych:  Alert and cooperative. Normal mood and affect. General:   Alert,  Well-developed, well-nourished, pleasant and cooperative in NAD Head:  Normocephalic and atraumatic. Eyes:  Sclera clear, no icterus.   Conjunctiva pink. Neck:  Supple; no masses or thyromegaly. Lungs:  Respirations even and unlabored.  Clear throughout to auscultation.   No wheezes, crackles, or rhonchi. No acute distress. Heart:  Regular rate and rhythm; no murmurs, clicks, rubs, or gallops. Abdomen:  Normal bowel sounds.  No bruits.  Soft, and non-distended without masses, hepatosplenomegaly or hernias noted.  No Tenderness.  No guarding or rebound tenderness.    Neurologic:  Alert and oriented x3;  grossly normal neurologically. Psych:  Alert and cooperative. Normal mood and affect.  Imaging Studies: No results found.  Assessment and Plan:   Sean Delacruz. is a 57 y.o. y/o male has been referred for GERD and dysphagia.  He has remote history of Nissen fundoplication in 2000.  Developed a stricture and had reversal of Nissen fundoplication with complications.  Reports having multiple esophageal dilations in the past.  He did well from 2015 until 1 month ago.  He has been having recurrent dysphagia and GERD for 1 month.  I am scheduling repeat barium swallow test and EGD.  Last screening colonoscopy was 14 years ago.  He is due for a repeat screening colonoscopy.  Dysphagia  Barium Swallow With Tablet  EGD with Possible Dilation I discussed risks of EGD with patient to include risk of bleeding, perforation, and risk of sedation.   Patient expressed understanding and agrees to proceed with EGD.   GERD  Increase Nexium from 20 Mg to 40 Mg daily.  Rx done.  GERD diet.  Colon Cancer Screening  Scheduling Colonoscopy I discussed risks of colonoscopy with patient to include risk of bleeding, colon perforation, and risk of sedation.   Patient expressed understanding  and agrees to proceed with colonoscopy.    Follow up in after procedures with Dr. Servando Snare.  [name], PA-C

## 2022-10-02 ENCOUNTER — Other Ambulatory Visit: Payer: Self-pay

## 2022-10-02 ENCOUNTER — Ambulatory Visit: Payer: BC Managed Care – PPO | Admitting: Physician Assistant

## 2022-10-02 ENCOUNTER — Encounter: Payer: Self-pay | Admitting: Physician Assistant

## 2022-10-02 VITALS — BP 137/90 | HR 60 | Temp 98.3°F | Ht 69.0 in | Wt 160.6 lb

## 2022-10-02 DIAGNOSIS — R1319 Other dysphagia: Secondary | ICD-10-CM

## 2022-10-02 DIAGNOSIS — Z1211 Encounter for screening for malignant neoplasm of colon: Secondary | ICD-10-CM

## 2022-10-02 DIAGNOSIS — K2100 Gastro-esophageal reflux disease with esophagitis, without bleeding: Secondary | ICD-10-CM

## 2022-10-02 MED ORDER — ESOMEPRAZOLE MAGNESIUM 40 MG PO CPDR
40.0000 mg | DELAYED_RELEASE_CAPSULE | Freq: Every day | ORAL | 3 refills | Status: AC
Start: 2022-10-02 — End: 2023-09-27

## 2022-10-02 MED ORDER — NA SULFATE-K SULFATE-MG SULF 17.5-3.13-1.6 GM/177ML PO SOLN: 354.0000 mL | Freq: Once | ORAL | 0 refills | Status: AC

## 2022-10-02 NOTE — Patient Instructions (Signed)

## 2022-10-08 ENCOUNTER — Other Ambulatory Visit: Payer: Self-pay | Admitting: Gastroenterology

## 2022-10-08 ENCOUNTER — Ambulatory Visit
Admission: RE | Admit: 2022-10-08 | Discharge: 2022-10-08 | Disposition: A | Discharge status: Home / Self Care | Payer: BC Managed Care – PPO | Source: Ambulatory Visit | Attending: Gastroenterology | Admitting: Gastroenterology

## 2022-10-08 DIAGNOSIS — R1319 Other dysphagia: Secondary | ICD-10-CM | POA: Diagnosis present

## 2022-10-08 DIAGNOSIS — K2100 Gastro-esophageal reflux disease with esophagitis, without bleeding: Secondary | ICD-10-CM | POA: Diagnosis present

## 2022-10-08 DIAGNOSIS — Z1211 Encounter for screening for malignant neoplasm of colon: Secondary | ICD-10-CM

## 2022-10-09 ENCOUNTER — Telehealth: Payer: Self-pay

## 2022-10-09 NOTE — Telephone Encounter (Signed)
Patient notifed.   Notify pt. barium swallow test shows moderate sized hiatal hernia, relaxed lower esophagus, mild esophageal dysmotility, small volume spontaneous acid reflux to the mid esophagus.  Barium tablet was mildly delayed at the distal esophagus.  **Recommend continue with plan for EGD and colonoscopy as scheduled.  Also continue with plan to increase Nexium to 40 mg once daily, take 30 minutes before a meal.  Patient can also add OTC Pepcid 20 Mg twice daily if needed for breakthrough acid reflux.

## 2022-10-09 NOTE — Progress Notes (Signed)
Dr Servando Snare patient

## 2022-10-09 NOTE — Progress Notes (Signed)
Notify pt. barium swallow test shows moderate sized hiatal hernia, relaxed lower esophagus, mild esophageal dysmotility, small volume spontaneous acid reflux to the mid esophagus.  Barium tablet was mildly delayed at the distal esophagus.  **Recommend continue with plan for EGD and colonoscopy as scheduled.  Also continue with plan to increase Nexium to 40 mg once daily, take 30 minutes before a meal.  Patient can also add OTC Pepcid 20 Mg twice daily if needed for breakthrough acid reflux.

## 2022-10-21 ENCOUNTER — Ambulatory Visit: Payer: BC Managed Care – PPO | Admitting: Family Medicine

## 2022-10-25 IMAGING — CR DG CHEST 2V
1 series · 2 of 2 positions shown · non-contrast
Comparison: January 24, 2020

CLINICAL DATA: Right arm numbness.

EXAM:
CHEST - 2 VIEW

[Series 1: dg chest 2 view · 0.14mm/px · 2 of 2 slices shown]
[im 1/2]
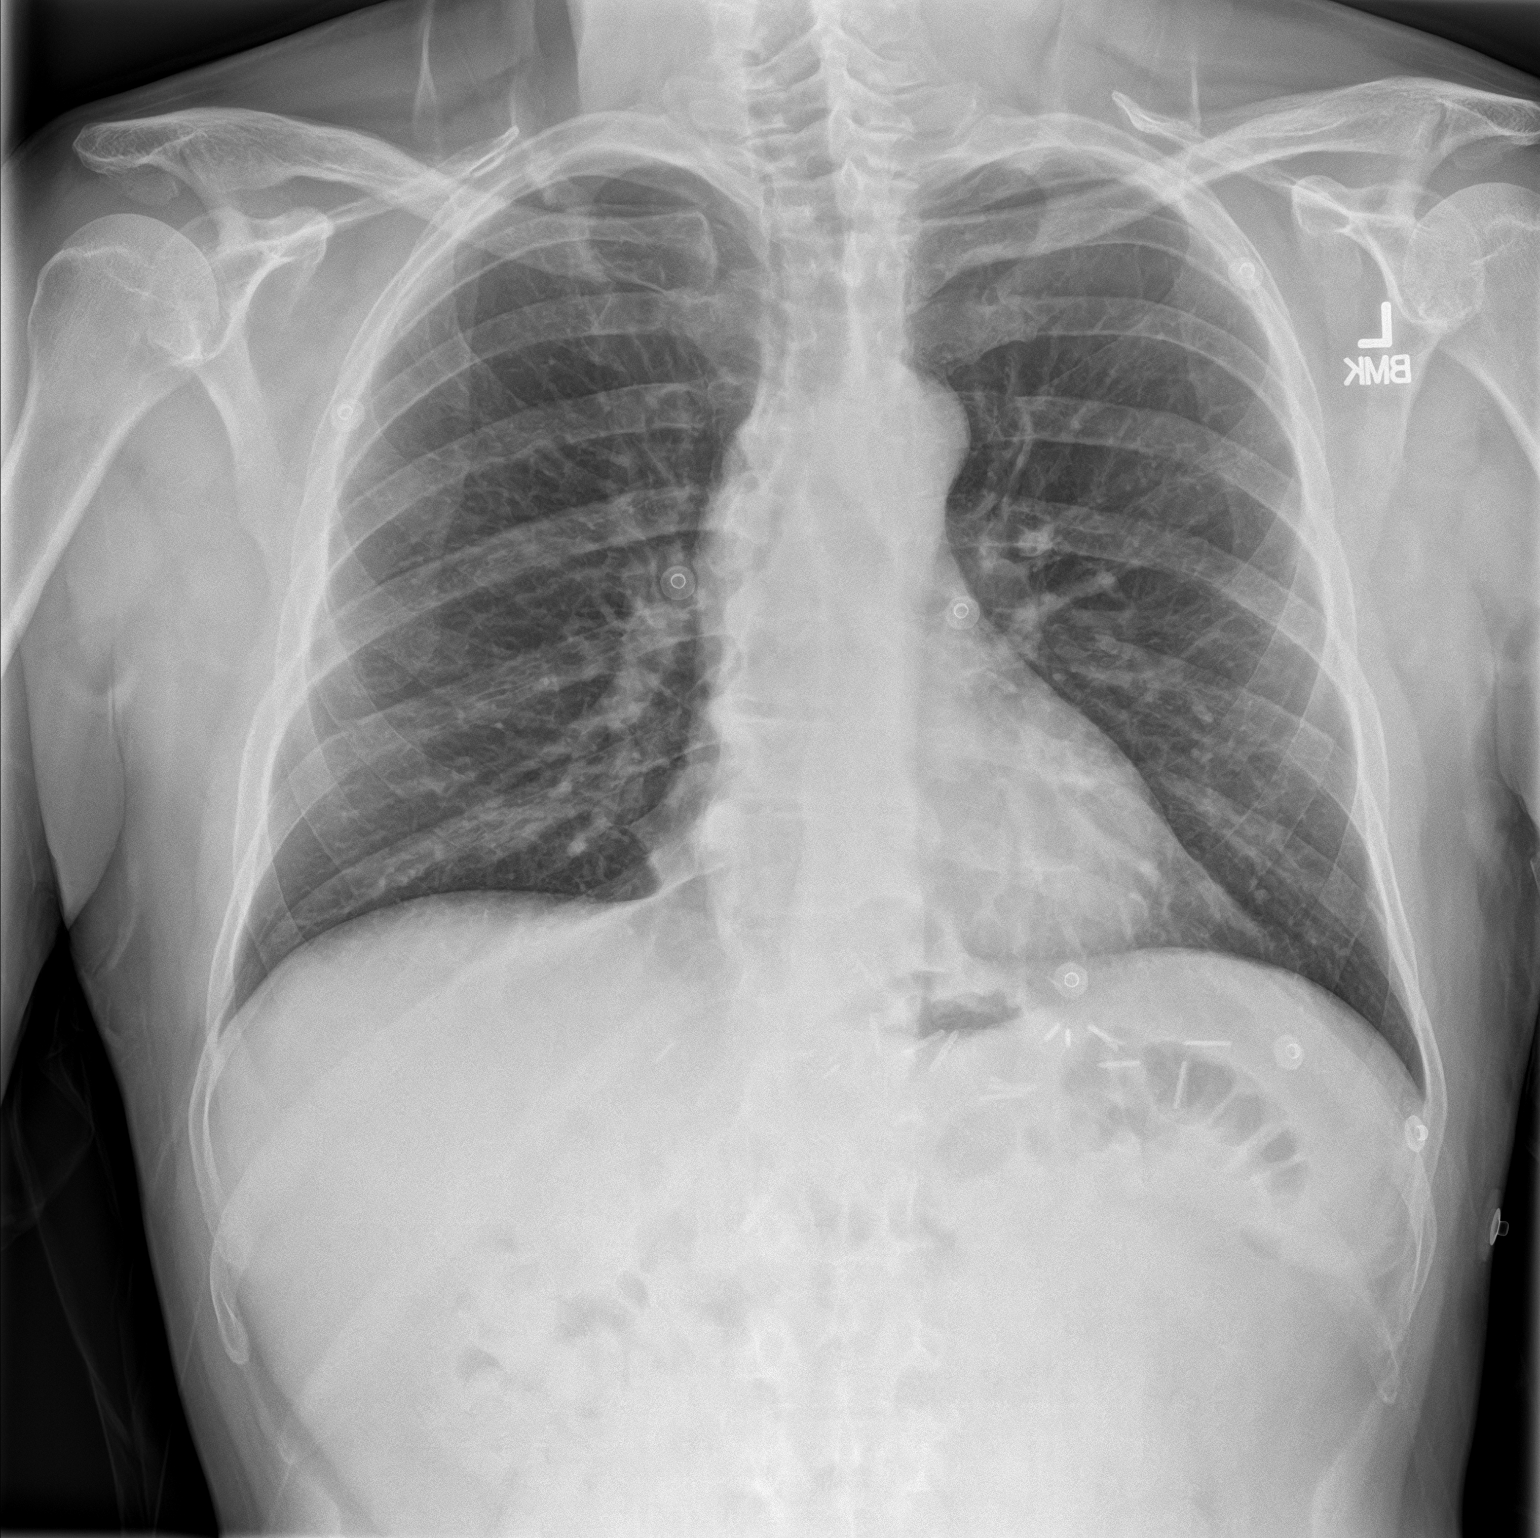
[im 2/2]
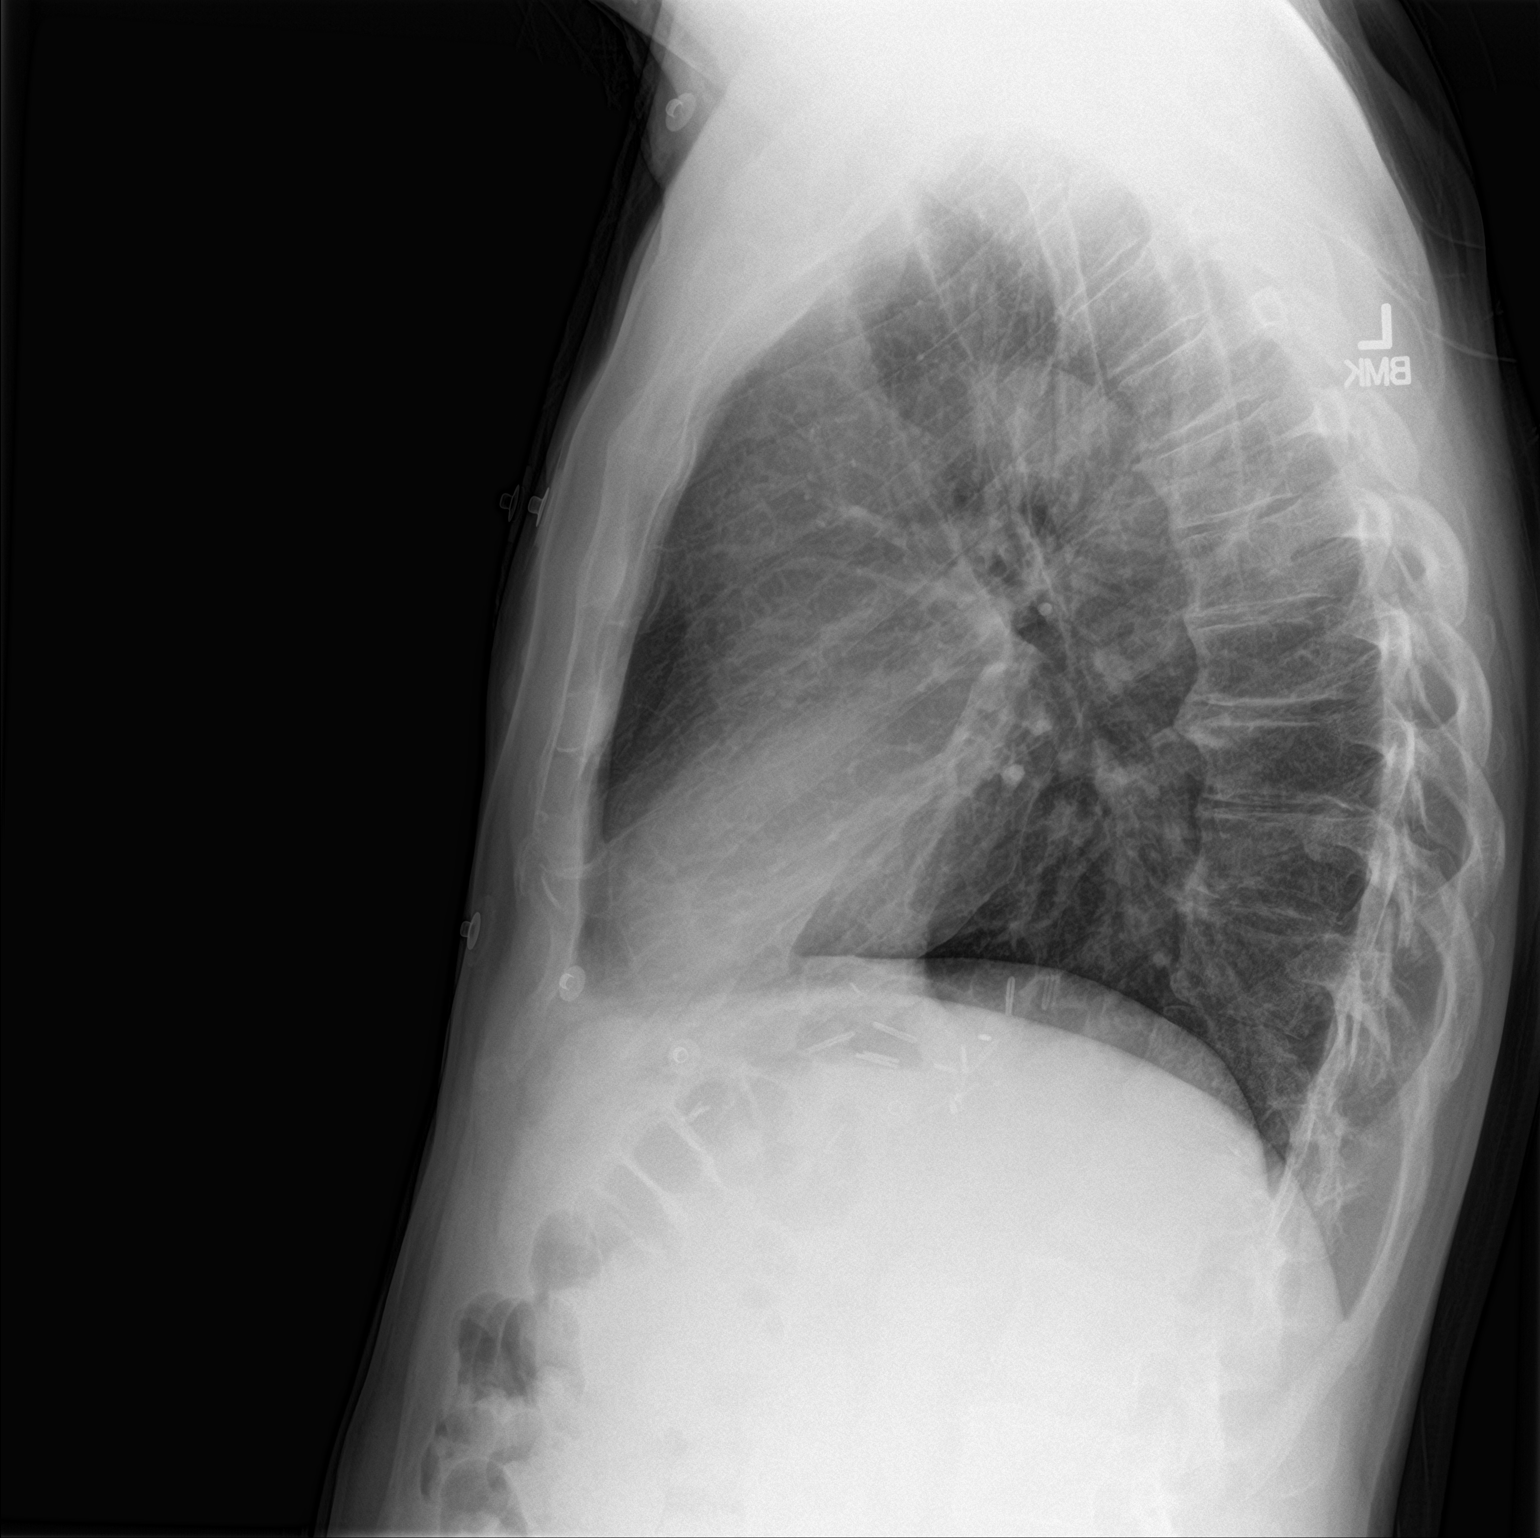

[2 of 2 positions shown; findings below may reference images not displayed]

FINDINGS: The heart size and mediastinal contours are within normal limits.
Both lungs are clear. Radiopaque surgical clips are seen within the
left upper quadrant. There is mild dextroscoliosis of the mid to
lower thoracic spine.
IMPRESSION: No active cardiopulmonary disease.

## 2022-11-11 ENCOUNTER — Other Ambulatory Visit: Payer: Self-pay | Admitting: Family Medicine

## 2022-11-11 DIAGNOSIS — M1A09X0 Idiopathic chronic gout, multiple sites, without tophus (tophi): Secondary | ICD-10-CM

## 2022-11-12 NOTE — Telephone Encounter (Signed)
Requested Prescriptions  Pending Prescriptions Disp Refills   colchicine 0.6 MG tablet [Pharmacy Med Name: colchicine 0.6 mg tablet (COLCRYS)] 90 tablet 0    Sig: Take 1 tablet (0.6 mg total) by mouth daily as needed.     Endocrinology:  Gout Agents - colchicine Passed - 11/11/2022  9:19 AM      Passed - Cr in normal range and within 360 days    Creatinine, Ser  Date Value Ref Range Status  09/17/2022 1.21 0.76 - 1.27 mg/dL Final         Passed - ALT in normal range and within 360 days    ALT  Date Value Ref Range Status  09/17/2022 25 0 - 44 IU/L Final         Passed - AST in normal range and within 360 days    AST  Date Value Ref Range Status  09/17/2022 19 0 - 40 IU/L Final         Passed - Valid encounter within last 12 months    Recent Outpatient Visits           1 month ago Oropharyngeal dysphagia   [place] [facility] [place], Megan P, DO   2 months ago Gout of left knee, unspecified cause, unspecified chronicity   [facility] [place], Megan P, DO   3 months ago Routine general medical examination at a health care facility   [facility] [place] P, DO   5 months ago Idiopathic chronic gout of right ankle without tophus   [place] Gabriel Cirri, NP   9 months ago Primary hypertension   [facility] M[place]um, Oswaldo Conroy, PA-C       Future Appointments             In 2 weeks Midge Minium, MD [facility] [place]   In 2 months Dorcas Carrow, DO [facility]            Passed - CBC within normal limits and completed in the last 12 months    WBC  Date Value Ref Range Status  09/17/2022 16.4 (H) 3.4 - 10.8 x10E3/uL Final  11/09/2020 7.5 4.0 - 10.5 K/uL Final   RBC  Date Value Ref Range Status  09/17/2022 5.04 4.14 - 5.80 x10E6/uL Final  11/09/2020 5.09 4.22 - 5.81 MIL/uL Final    Hemoglobin  Date Value Ref Range Status  09/17/2022 14.6 13.0 - 17.7 g/dL Final   Hematocrit  Date Value Ref Range Status  09/17/2022 42.7 37.5 - 51.0 % Final   MCHC  Date Value Ref Range Status  09/17/2022 34.2 31.5 - 35.7 g/dL Final  16/02/9603 54.0 30.0 - 36.0 g/dL Final   [facility]  Date Value Ref Range Status  09/17/2022 29.0 26.6 - 33.0 pg Final  11/09/2020 29.5 26.0 - 34.0 pg Final   MCV  Date Value Ref Range Status  09/17/2022 85 79 - 97 fL Final   No results found for: "PLTCOUNTKUC", "LABPLAT", "POCPLA" RDW  Date Value Ref Range Status  09/17/2022 12.9 11.6 - 15.4 % Final

## 2022-11-18 ENCOUNTER — Encounter: Payer: Self-pay | Admitting: Gastroenterology

## 2022-11-20 NOTE — Anesthesia Preprocedure Evaluation (Addendum)
Anesthesia Evaluation  Patient identified by MRN, date of birth, ID band Patient awake    Reviewed: Allergy & Precautions, H&P , NPO status , Patient's Chart, lab work & pertinent test results  Airway Mallampati: II       Dental  (+) Poor Dentition, Loose The lower central and lateral incisors are extremely loose. Patient states he plans to have these teeth extracted in the next week or two.  I explained to patient that it is very possible that he could lose one or more of these teeth today, as the gastroenterologist will be doing an upper endoscopy, but that Dr. Servando Snare will attempt to not remove any teeth. Patient agreed.:   Pulmonary neg pulmonary ROS, former smoker          Cardiovascular hypertension, negative cardio ROS      Neuro/Psych TIAnegative neurological ROS  negative psych ROS   GI/Hepatic negative GI ROS, Neg liver ROS,GERD  ,,  Endo/Other  negative endocrine ROS    Renal/GU negative Renal ROS  negative genitourinary   Musculoskeletal negative musculoskeletal ROS (+)    Abdominal   Peds negative pediatric ROS (+)  Hematology negative hematology ROS (+)   Anesthesia Other Findings Allergic rhinitis Hyperlipidemia Hypertension  Gout Dysphagia  History of cellulitis GERD (gastroesophageal reflux disease) TIA (transient ischemic attack)    Reproductive/Obstetrics negative OB ROS                             Anesthesia Physical Anesthesia Plan  ASA: 3  Anesthesia Plan: General   Post-op Pain Management:    Induction: Intravenous  PONV Risk Score and Plan:   Airway Management Planned: Natural Airway and Nasal Cannula  Additional Equipment:   Intra-op Plan:   Post-operative Plan:   Informed Consent: I have reviewed the patients History and Physical, chart, labs and discussed the procedure including the risks, benefits and alternatives for the proposed anesthesia with  the patient or authorized representative who has indicated his/her understanding and acceptance.     Dental Advisory Given  Plan Discussed with: Anesthesiologist, CRNA and Surgeon  Anesthesia Plan Comments: (Patient consented for risks of anesthesia including but not limited to:  - adverse reactions to medications - risk of airway placement if required - damage to eyes, teeth, lips or other oral mucosa - nerve damage due to positioning  - sore throat or hoarseness - Damage to heart, brain, nerves, lungs, other parts of body or loss of life  Patient voiced understanding.)        Anesthesia Quick Evaluation

## 2022-11-22 ENCOUNTER — Encounter
Admission: RE | Disposition: A | Discharge status: Home / Self Care | Payer: Self-pay | Source: Home / Self Care | Attending: Gastroenterology

## 2022-11-22 ENCOUNTER — Ambulatory Visit: Payer: BC Managed Care – PPO | Admitting: Anesthesiology

## 2022-11-22 ENCOUNTER — Encounter: Payer: Self-pay | Admitting: Gastroenterology

## 2022-11-22 ENCOUNTER — Ambulatory Visit
Admission: RE | Admit: 2022-11-22 | Discharge: 2022-11-22 | Disposition: A | Discharge status: Home / Self Care | Payer: BC Managed Care – PPO | Attending: Gastroenterology | Admitting: Gastroenterology

## 2022-11-22 ENCOUNTER — Other Ambulatory Visit: Payer: Self-pay

## 2022-11-22 DIAGNOSIS — I10 Essential (primary) hypertension: Secondary | ICD-10-CM | POA: Insufficient documentation

## 2022-11-22 DIAGNOSIS — Z8249 Family history of ischemic heart disease and other diseases of the circulatory system: Secondary | ICD-10-CM | POA: Insufficient documentation

## 2022-11-22 DIAGNOSIS — K2100 Gastro-esophageal reflux disease with esophagitis, without bleeding: Secondary | ICD-10-CM

## 2022-11-22 DIAGNOSIS — Z87891 Personal history of nicotine dependence: Secondary | ICD-10-CM | POA: Diagnosis not present

## 2022-11-22 DIAGNOSIS — K219 Gastro-esophageal reflux disease without esophagitis: Secondary | ICD-10-CM | POA: Diagnosis not present

## 2022-11-22 DIAGNOSIS — R1319 Other dysphagia: Secondary | ICD-10-CM

## 2022-11-22 DIAGNOSIS — D122 Benign neoplasm of ascending colon: Secondary | ICD-10-CM | POA: Insufficient documentation

## 2022-11-22 DIAGNOSIS — Z1211 Encounter for screening for malignant neoplasm of colon: Secondary | ICD-10-CM | POA: Insufficient documentation

## 2022-11-22 DIAGNOSIS — K222 Esophageal obstruction: Secondary | ICD-10-CM | POA: Diagnosis not present

## 2022-11-22 DIAGNOSIS — R1310 Dysphagia, unspecified: Secondary | ICD-10-CM | POA: Diagnosis present

## 2022-11-22 DIAGNOSIS — Z8673 Personal history of transient ischemic attack (TIA), and cerebral infarction without residual deficits: Secondary | ICD-10-CM | POA: Diagnosis not present

## 2022-11-22 DIAGNOSIS — K640 First degree hemorrhoids: Secondary | ICD-10-CM | POA: Insufficient documentation

## 2022-11-22 DIAGNOSIS — K635 Polyp of colon: Secondary | ICD-10-CM | POA: Diagnosis not present

## 2022-11-22 HISTORY — PX: COLONOSCOPY WITH PROPOFOL: SHX5780

## 2022-11-22 HISTORY — PX: POLYPECTOMY: SHX5525

## 2022-11-22 HISTORY — PX: ESOPHAGEAL DILATION: SHX303

## 2022-11-22 HISTORY — PX: ESOPHAGOGASTRODUODENOSCOPY (EGD) WITH PROPOFOL: SHX5813

## 2022-11-22 SURGERY — COLONOSCOPY WITH PROPOFOL
Anesthesia: General | Site: Throat

## 2022-11-22 MED ORDER — STERILE WATER FOR IRRIGATION IR SOLN
Status: DC | PRN
  Administered 2022-11-22: .05 mL

## 2022-11-22 MED ORDER — STERILE WATER FOR IRRIGATION IR SOLN
Status: DC | PRN
  Administered 2022-11-22: 50 mL

## 2022-11-22 MED ORDER — LIDOCAINE HCL (CARDIAC) PF 100 MG/5ML IV SOSY
PREFILLED_SYRINGE | INTRAVENOUS | Status: DC | PRN
  Administered 2022-11-22: 50 mg via INTRAVENOUS

## 2022-11-22 MED ORDER — SODIUM CHLORIDE 0.9 % IV SOLN: INTRAVENOUS | Status: DC

## 2022-11-22 MED ORDER — DEXAMETHASONE SODIUM PHOSPHATE 4 MG/ML IJ SOLN
INTRAMUSCULAR | Status: DC | PRN
  Administered 2022-11-22: 4 mg via INTRAVENOUS

## 2022-11-22 MED ORDER — LACTATED RINGERS IV SOLN: INTRAVENOUS | Status: DC

## 2022-11-22 MED ORDER — ONDANSETRON HCL 4 MG/2ML IJ SOLN
INTRAMUSCULAR | Status: DC | PRN
  Administered 2022-11-22: 4 mg via INTRAVENOUS

## 2022-11-22 MED ORDER — PROPOFOL 10 MG/ML IV BOLUS
INTRAVENOUS | Status: DC | PRN
  Administered 2022-11-22 (×2): 30 mg via INTRAVENOUS
  Administered 2022-11-22: 20 mg via INTRAVENOUS
  Administered 2022-11-22: 30 mg via INTRAVENOUS
  Administered 2022-11-22: 40 mg via INTRAVENOUS
  Administered 2022-11-22: 20 mg via INTRAVENOUS
  Administered 2022-11-22: 100 mg via INTRAVENOUS

## 2022-11-22 MED ORDER — GLYCOPYRROLATE 0.2 MG/ML IJ SOLN
INTRAMUSCULAR | Status: DC | PRN
  Administered 2022-11-22: .2 mg via INTRAVENOUS

## 2022-11-22 SURGICAL SUPPLY — 14 items
BALLN DILATOR 12-15 8 (BALLOONS) ×4
BALLN DILATOR 15-18 8 (BALLOONS) ×4
BALLOON DILATOR 12-15 8 (BALLOONS) IMPLANT
BALLOON DILATOR 15-18 8 (BALLOONS) IMPLANT
BLOCK BITE 60FR ADLT L/F GRN (MISCELLANEOUS) ×4 IMPLANT
GOWN CVR UNV OPN BCK APRN NK (MISCELLANEOUS) ×8 IMPLANT
GOWN ISOL THUMB LOOP REG UNIV (MISCELLANEOUS) ×8
KIT PRC NS LF DISP ENDO (KITS) ×4 IMPLANT
KIT PROCEDURE OLYMPUS (KITS) ×8
MANIFOLD NEPTUNE II (INSTRUMENTS) ×4 IMPLANT
SNARE COLD EXACTO (MISCELLANEOUS) IMPLANT
SYR INFLATION 60ML (SYRINGE) IMPLANT
TRAP ETRAP POLY (MISCELLANEOUS) IMPLANT
WATER STERILE IRR 250ML POUR (IV SOLUTION) ×4 IMPLANT

## 2022-11-22 NOTE — Anesthesia Postprocedure Evaluation (Signed)
Anesthesia Post Note  Patient: Nicola Fehlman.  Procedure(s) Performed: COLONOSCOPY WITH PROPOFOL (Rectum) ESOPHAGOGASTRODUODENOSCOPY (EGD) WITH PROPOFOL (Throat) ESOPHAGEAL DILATION (Throat) POLYPECTOMY (Rectum)  Patient location during evaluation: PACU Anesthesia Type: General Level of consciousness: awake and alert Pain management: pain level controlled Vital Signs Assessment: post-procedure vital signs reviewed and stable Respiratory status: spontaneous breathing, nonlabored ventilation, respiratory function stable and patient connected to nasal cannula oxygen Cardiovascular status: blood pressure returned to baseline and stable Postop Assessment: no apparent nausea or vomiting Anesthetic complications: no   No notable events documented.   Last Vitals:  Vitals:   11/22/22 0938 11/22/22 0940  BP:  125/88  Pulse:    Resp: 13 13  Temp:    SpO2:      Last Pain:  Vitals:   11/22/22 0929  TempSrc: Oral  PainSc:                  Marisue Humble

## 2022-11-22 NOTE — Op Note (Signed)
[facility] Gastroenterology Patient Name: Sean Delacruz Procedure Date: 11/22/2022 8:55 AM MRN: [id number] Account #: [id number] Date of Birth: 20-Jan-1966 Admit Type: Outpatient Age: 57 Room: [facility] OR ROOM 01 Gender: Male Note Status: Finalized Instrument Name: [id number] Procedure:             Colonoscopy Indications:           Screening for colorectal malignant neoplasm Providers:             Midge Minium MD, MD Referring MD:          Dorcas Carrow (Referring MD) Medicines:             Propofol per Anesthesia Complications:         No immediate complications. Procedure:             Pre-Anesthesia Assessment:                        - Prior to the procedure, a History and Physical was                         performed, and patient medications and allergies were                         reviewed. The patient's tolerance of previous                         anesthesia was also reviewed. The risks and benefits                         of the procedure and the sedation options and risks                         were discussed with the patient. All questions were                         answered, and informed consent was obtained. Prior                         Anticoagulants: The patient has taken no anticoagulant                         or antiplatelet agents. ASA Grade Assessment: II - A                         patient with mild systemic disease. After reviewing                         the risks and benefits, the patient was deemed in                         satisfactory condition to undergo the procedure.                        After obtaining informed consent, the colonoscope was                         passed under direct vision. Throughout the procedure,  the patient's blood pressure, pulse, and oxygen                         saturations were monitored continuously. The                         Colonoscope was introduced through the anus and                          advanced to the the cecum, identified by appendiceal                         orifice and ileocecal valve. The colonoscopy was                         performed without difficulty. The patient tolerated                         the procedure well. The quality of the bowel                         preparation was excellent. Findings:      The perianal and digital rectal examinations were normal.      A 7 mm polyp was found in the ascending colon. The polyp was sessile.       The polyp was removed with a cold snare. Resection and retrieval were       complete.      Non-bleeding internal hemorrhoids were found during retroflexion. The       hemorrhoids were Grade I (internal hemorrhoids that do not prolapse). Impression:            - One 7 mm polyp in the ascending colon, removed with                         a cold snare. Resected and retrieved.                        - Non-bleeding internal hemorrhoids. Recommendation:        - Discharge patient to home.                        - Resume previous diet.                        - Continue present medications.                        - Await pathology results.                        - If the pathology report reveals adenomatous tissue,                         then repeat the colonoscopy for surveillance in 7                         years. Procedure Code(s):     --- Professional ---  [address], Colonoscopy, flexible; with removal of                         tumor(s), polyp(s), or other lesion(s) by snare                         technique Diagnosis Code(s):     --- Professional ---                        Z12.11, Encounter for screening for malignant neoplasm                         of colon                        D12.2, Benign neoplasm of ascending colon CPT copyright 2022 American Medical Association. All rights reserved. The codes documented in this report are preliminary and upon coder review may  be revised  to meet current compliance requirements. Midge Minium MD, MD 11/22/2022 9:22:29 AM This report has been signed electronically. Number of Addenda: 0 Note Initiated On: 11/22/2022 8:55 AM Scope Withdrawal Time: 0 hours 6 minutes 56 seconds  Total Procedure Duration: 0 hours 8 minutes 37 seconds  Estimated Blood Loss:  Estimated blood loss: none.      [facility]

## 2022-11-22 NOTE — Op Note (Signed)
[facility] Gastroenterology Patient Name: Sean Delacruz Procedure Date: 11/22/2022 8:56 AM MRN: [id number] Account #: [id number] Date of Birth: 02-May-1966 Admit Type: Outpatient Age: 57 Room: [facility] OR ROOM 01 Gender: Male Note Status: Finalized Instrument Name: [id number] Procedure:             Upper GI endoscopy Indications:           Dysphagia Providers:             Midge Minium MD, MD Referring MD:          Dorcas Carrow (Referring MD) Medicines:             Propofol per Anesthesia Complications:         No immediate complications. Procedure:             Pre-Anesthesia Assessment:                        - Prior to the procedure, a History and Physical was                         performed, and patient medications and allergies were                         reviewed. The patient's tolerance of previous                         anesthesia was also reviewed. The risks and benefits                         of the procedure and the sedation options and risks                         were discussed with the patient. All questions were                         answered, and informed consent was obtained. Prior                         Anticoagulants: The patient has taken no anticoagulant                         or antiplatelet agents. ASA Grade Assessment: II - A                         patient with mild systemic disease. After reviewing                         the risks and benefits, the patient was deemed in                         satisfactory condition to undergo the procedure.                        After obtaining informed consent, the endoscope was                         passed under direct vision. Throughout the procedure,  the patient's blood pressure, pulse, and oxygen                         saturations were monitored continuously. The Endoscope                         was introduced through the mouth, and advanced to the                          second part of duodenum. The upper GI endoscopy was                         accomplished without difficulty. The patient tolerated                         the procedure well. Findings:      Evidence of a Nissen fundoplication was found at the gastroesophageal       junction.      One benign-appearing, intrinsic mild stenosis was found at the       gastroesophageal junction. The stenosis was traversed. A TTS dilator was       passed through the scope. Dilation with a 15-16.5-18 mm balloon dilator       was performed to 15 mm. The dilation site was examined following       endoscope reinsertion and showed complete resolution of luminal       narrowing.      The stomach was normal.      The examined duodenum was normal. Impression:            - A Nissen fundoplication was found.                        - Benign-appearing esophageal stenosis. Dilated.                        - Normal stomach.                        - Normal examined duodenum.                        - No specimens collected. Recommendation:        - Discharge patient to home.                        - Resume previous diet.                        - Continue present medications.                        - Perform a colonoscopy today. Procedure Code(s):     --- Professional ---                        [phone], Esophagogastroduodenoscopy, flexible,                         transoral; with transendoscopic balloon dilation of                         esophagus (less  than 30 mm diameter) Diagnosis Code(s):     --- Professional ---                        R13.10, Dysphagia, unspecified                        K22.2, Esophageal obstruction CPT copyright 2022 American Medical Association. All rights reserved. The codes documented in this report are preliminary and upon coder review may  be revised to meet current compliance requirements. Midge Minium MD, MD 11/22/2022 9:11:01 AM This report has been signed electronically. Number of  Addenda: 0 Note Initiated On: 11/22/2022 8:56 AM Total Procedure Duration: 0 hours 6 minutes 5 seconds  Estimated Blood Loss:  Estimated blood loss: none.      [facility]

## 2022-11-22 NOTE — Transfer of Care (Signed)
Immediate Anesthesia Transfer of Care Note  Patient: Sean Delacruz.  Procedure(s) Performed: COLONOSCOPY WITH PROPOFOL (Rectum) ESOPHAGOGASTRODUODENOSCOPY (EGD) WITH PROPOFOL (Throat) ESOPHAGEAL DILATION (Throat) POLYPECTOMY (Rectum)  Patient Location: PACU  Anesthesia Type: General  Level of Consciousness: awake, alert  and patient cooperative  Airway and Oxygen Therapy: Patient Spontanous Breathing and Patient connected to supplemental oxygen  Post-op Assessment: Post-op Vital signs reviewed, Patient's Cardiovascular Status Stable, Respiratory Function Stable, Patent Airway and No signs of Nausea or vomiting  Post-op Vital Signs: Reviewed and stable  Complications: No notable events documented.

## 2022-11-22 NOTE — H&P (Signed)
Midge Minium, MD [facility] [address], [address] [place], [place] [address] Phone:[phone] Fax : [phone]  Primary Care Physician:  Dorcas Carrow, DO Primary Gastroenterologist:  Dr. Servando Snare  Pre-Procedure History & Physical: HPI:  Sean Delacruz. is a 57 y.o. male is here for an endoscopy and colonoscopy.   Past Medical History:  Diagnosis Date   Allergic rhinitis    Dysphagia    GERD (gastroesophageal reflux disease)    Gout    History of cellulitis 11/2013 and 8/15   Hyperlipidemia    Hypertension    TIA (transient ischemic attack) 2022   No deficits    Past Surgical History:  Procedure Laterality Date   HIATAL HERNIA REPAIR     x3   KNEE ARTHROPLASTY     KNEE SURGERY      Prior to Admission medications   Medication Sig Start Date End Date Taking? Authorizing Provider  colchicine 0.6 MG tablet Take 1 tablet (0.6 mg total) by mouth daily as needed. 11/12/22 01/11/23 Yes Johnson, Megan P, DO  esomeprazole (NEXIUM) 40 MG capsule Take 1 capsule (40 mg total) by mouth daily at 12 noon. 10/02/22 09/27/23 Yes [name], PA-C  febuxostat (ULORIC) 40 MG tablet Take 1 tablet (40 mg total) by mouth daily. 09/06/22  Yes Johnson, Megan P, DO  losartan (COZAAR) 25 MG tablet Take 1 tablet (25 mg total) by mouth daily. 08/20/22  Yes Johnson, Megan P, DO    Allergies as of 10/02/2022   (No Known Allergies)    Family History  Problem Relation Age of Onset   Hypertension Father    Diabetes Father    Stroke Mother    Hypertension Paternal Grandmother    Myasthenia gravis Other        grandfather   Cancer Maternal Grandmother     Social History   Socioeconomic History   Marital status: Married    Spouse name: Not on file   Number of children: Not on file   Years of education: Not on file   Highest education level: Not on file  Occupational History   Not on file  Tobacco Use   Smoking status: Former    Current packs/day: 0.00    Average packs/day: 1.3 packs/day  for 8.0 years (10.0 ttl pk-yrs)    Types: Cigarettes    Start date: 05/13/1988    Quit date: 05/13/1996    Years since quitting: 26.5   Smokeless tobacco: Never  Vaping Use   Vaping status: Never Used  Substance and Sexual Activity   Alcohol use: No   Drug use: No   Sexual activity: Never    Birth control/protection: None  Other Topics Concern   Not on file  Social History Narrative   Not on file   Social Determinants of Health   Financial Resource Strain: Not on file  Food Insecurity: Not on file  Transportation Needs: Not on file  Physical Activity: Not on file  Stress: Not on file  Social Connections: Not on file  Intimate Partner Violence: Not on file    Review of Systems: See HPI, otherwise negative ROS  Physical Exam: BP (!) 139/95   Pulse (!) 56   Temp 97.7 F (36.5 C) (Temporal)   Resp 18   Ht 5\' 9"  (1.753 m)   Wt 68.9 kg   SpO2 100%   BMI 22.45 kg/m  General:   Alert,  pleasant and cooperative in NAD Head:  Normocephalic and atraumatic. Neck:  Supple; no masses or thyromegaly. Lungs:  Clear throughout to auscultation.    Heart:  Regular rate and rhythm. Abdomen:  Soft, nontender and nondistended. Normal bowel sounds, without guarding, and without rebound.   Neurologic:  Alert and  oriented x4;  grossly normal neurologically.  Impression/Plan: Sean Delacruz. is here for an endoscopy and colonoscopy to be performed for screening and dysphagia  Risks, benefits, limitations, and alternatives regarding  endoscopy and colonoscopy have been reviewed with the patient.  Questions have been answered.  All parties agreeable.   Midge Minium, MD  11/22/2022, 8:46 AM

## 2022-11-25 ENCOUNTER — Encounter: Payer: Self-pay | Admitting: Gastroenterology

## 2022-11-26 ENCOUNTER — Encounter: Payer: Self-pay | Admitting: Gastroenterology

## 2022-11-28 ENCOUNTER — Ambulatory Visit: Payer: BC Managed Care – PPO | Admitting: Gastroenterology

## 2022-11-28 ENCOUNTER — Encounter: Payer: Self-pay | Admitting: Gastroenterology

## 2022-11-28 NOTE — Progress Notes (Deleted)
    Primary Care Physician: Dorcas Carrow, DO  Primary Gastroenterologist:  Dr. Midge Minium  No chief complaint on file.   HPI: Sean Delacruz. is a 57 y.o. male here for follow-up after being seen in the office for dysphagia and had an upper endoscopy.  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  The patient also had a colonoscopy with a polyp removed that was to be adenomatous and a repeat colonoscopy was recommended in 7 years.  Past Medical History:  Diagnosis Date   Allergic rhinitis    Dysphagia    GERD (gastroesophageal reflux disease)    Gout    History of cellulitis 11/2013 and 8/15   Hyperlipidemia    Hypertension    TIA (transient ischemic attack) 2022   No deficits    Current Outpatient Medications  Medication Sig Dispense Refill   colchicine 0.6 MG tablet Take 1 tablet (0.6 mg total) by mouth daily as needed. 90 tablet 1   esomeprazole (NEXIUM) 40 MG capsule Take 1 capsule (40 mg total) by mouth daily at 12 noon. 90 capsule 3   febuxostat (ULORIC) 40 MG tablet Take 1 tablet (40 mg total) by mouth daily. 90 tablet 1   losartan (COZAAR) 25 MG tablet Take 1 tablet (25 mg total) by mouth daily. 90 tablet 1   No current facility-administered medications for this visit.    Allergies as of 11/28/2022 - Review Complete 11/22/2022  Allergen Reaction Noted   Shrimp (diagnostic)  11/18/2022    ROS:  General: Negative for anorexia, weight loss, fever, chills, fatigue, weakness. ENT: Negative for hoarseness, difficulty swallowing , nasal congestion. CV: Negative for chest pain, angina, palpitations, dyspnea on exertion, peripheral edema.  Respiratory: Negative for dyspnea at rest, dyspnea on exertion, cough, sputum, wheezing.  GI: See history of present illness. GU:  Negative for dysuria, hematuria, urinary incontinence, urinary frequency, nocturnal urination.  Endo: Negative for unusual weight change.     Physical Examination:   There were no vitals taken for this visit.  General: Well-nourished, well-developed in no acute distress.  Eyes: No icterus. Conjunctivae pink. Lungs: Clear to auscultation bilaterally. Non-labored. Heart: Regular rate and rhythm, no murmurs rubs or gallops.  Abdomen: Bowel sounds are normal, nontender, nondistended, no hepatosplenomegaly or masses, no abdominal bruits or hernia , no rebound or guarding.   Extremities: No lower extremity edema. No clubbing or deformities. Neuro: Alert and oriented x 3.  Grossly intact. Skin: Warm and dry, no jaundice.   Psych: Alert and cooperative, normal mood and affect.  Labs:    Imaging Studies: No results found.  Assessment and Plan:   Bronsyn Shappell. is a 57 y.o. y/o male ***     Midge Minium, MD. Clementeen Graham    Note: This dictation was prepared with Dragon dictation along with smaller phrase technology. Any transcriptional errors that result from this process are unintentional.

## 2023-02-05 ENCOUNTER — Ambulatory Visit: Payer: BC Managed Care – PPO | Admitting: Family Medicine

## 2023-02-25 ENCOUNTER — Ambulatory Visit: Payer: BC Managed Care – PPO | Admitting: Family Medicine

## 2023-02-25 VITALS — BP 110/72 | HR 57 | Temp 97.8°F | Ht 68.11 in | Wt 164.8 lb

## 2023-02-25 DIAGNOSIS — M25511 Pain in right shoulder: Secondary | ICD-10-CM | POA: Insufficient documentation

## 2023-02-25 DIAGNOSIS — H9201 Otalgia, right ear: Secondary | ICD-10-CM | POA: Diagnosis not present

## 2023-02-25 MED ORDER — DICLOFENAC SODIUM 1 % EX GEL: 2.0000 g | Freq: Four times a day (QID) | CUTANEOUS | 0 refills | Status: DC

## 2023-02-25 MED ORDER — CYCLOBENZAPRINE HCL 5 MG PO TABS: 5.0000 mg | ORAL_TABLET | Freq: Three times a day (TID) | ORAL | 1 refills | Status: DC | PRN

## 2023-02-25 NOTE — Progress Notes (Signed)
BP 110/72   Pulse (!) 57   Temp 97.8 F (36.6 C) (Oral)   Ht 5' 8.11" (1.73 m)   Wt 164 lb 12.8 oz (74.8 kg)   SpO2 98%   BMI 24.98 kg/m    Subjective:    Patient ID: Sean Ade., male    DOB: 01-29-1966, 57 y.o.   MRN: [id number]  HPI: Sean Delacruz. is a 57 y.o. male  Chief Complaint  Patient presents with   Shoulder Pain   Ear Pain   Neck tenderness; pain radiating from right ear down neck and now left shoulder is affected with rotation;   EAR PAIN Started two weeks ago, with feelings of clooged and popping sensation. Sometimes the pain wakes him up at night. He feels the pain is radiating down his neck.  Duration: 1 weeks occurring daily  Involved ear(s): right Severity:  3/10  Quality:  sharp and aching Fever: no Otorrhea: no Upper respiratory infection symptoms: no Pruritus: yes Hearing loss: yes Water immersion no Using Q-tips:  Yes Recurrent otitis media: no Status: worse Treatments attempted: Tylenol; provided slight relief.   SHOULDER PAIN Started two weeks ago  Duration: 2 weeks Involved shoulder: right Mechanism of injury: unknown Location: lateral Onset:gradual Severity: 4/10  Quality:  sharp, aching, and throbbing Frequency: constant Radiation: yes to the anterior Aggravating factors: lifting, movement, and sleep  Alleviating factors: nothing  Status: worse Treatments attempted: Tylenol  Relief with NSAIDs?:  No NSAIDs Taken, hx of stroke Weakness: no Numbness: no Decreased grip strength: yes Redness: no Swelling: no Bruising: no Fevers: no   Relevant past medical, surgical, family and social history reviewed and updated as indicated. Interim medical history since our last visit reviewed. Allergies and medications reviewed and updated.  Review of Systems  HENT:  Positive for ear pain. Negative for ear discharge.   Respiratory: Negative.    Cardiovascular: Negative.   Musculoskeletal:  Positive for arthralgias and  myalgias.    Per HPI unless specifically indicated above     Objective:    BP 110/72   Pulse (!) 57   Temp 97.8 F (36.6 C) (Oral)   Ht 5' 8.11" (1.73 m)   Wt 164 lb 12.8 oz (74.8 kg)   SpO2 98%   BMI 24.98 kg/m   Wt Readings from Last 3 Encounters:  02/25/23 164 lb 12.8 oz (74.8 kg)  11/22/22 152 lb (68.9 kg)  10/02/22 160 lb 9.6 oz (72.8 kg)    Physical Exam Vitals and nursing note reviewed.  Constitutional:      General: He is awake. He is not in acute distress.    Appearance: Normal appearance. He is well-developed and well-groomed. He is not ill-appearing.  HENT:     Head: Normocephalic and atraumatic.     Right Ear: Hearing and external ear normal. Tenderness present. No drainage. Tympanic membrane is not erythematous.     Left Ear: Hearing and external ear normal. No drainage. Tympanic membrane is not erythematous.     Nose: Nose normal.  Eyes:     General: Lids are normal.        Right eye: No discharge.        Left eye: No discharge.     Conjunctiva/sclera: Conjunctivae normal.  Cardiovascular:     Rate and Rhythm: Regular rhythm. Bradycardia present.     Pulses:          Radial pulses are 2+ on the right side and 2+ on  the left side.       Posterior tibial pulses are 2+ on the right side and 2+ on the left side.     Heart sounds: Normal heart sounds, S1 normal and S2 normal. No murmur heard.    No gallop.  Pulmonary:     Effort: Pulmonary effort is normal. No accessory muscle usage or respiratory distress.     Breath sounds: Normal breath sounds. No wheezing, rhonchi or rales.  Musculoskeletal:     Right shoulder: Tenderness present. Decreased range of motion.     Cervical back: Full passive range of motion without pain and normal range of motion.     Right lower leg: No edema.     Left lower leg: No edema.  Skin:    General: Skin is warm and dry.     Capillary Refill: Capillary refill takes less than 2 seconds.  Neurological:     Mental Status: He is  alert and oriented to person, place, and time.  Psychiatric:        Attention and Perception: Attention normal.        Mood and Affect: Mood normal.        Speech: Speech normal.        Behavior: Behavior normal. Behavior is cooperative.        Thought Content: Thought content normal.     Results for orders placed or performed in visit on 09/17/22  CBC with Differential/Platelet  Result Value Ref Range   WBC 16.4 (H) 3.4 - 10.8 x10E3/uL   RBC 5.04 4.14 - 5.80 x10E6/uL   Hemoglobin 14.6 13.0 - 17.7 g/dL   Hematocrit 72.5 36.6 - 51.0 %   MCV 85 79 - 97 fL   MCH 29.0 26.6 - 33.0 pg   MCHC 34.2 31.5 - 35.7 g/dL   RDW 44.0 34.7 - 42.5 %   Platelets 339 150 - 450 x10E3/uL   Neutrophils 90 Not Estab. %   Lymphs 5 Not Estab. %   Monocytes 4 Not Estab. %   Eos 0 Not Estab. %   Basos 0 Not Estab. %   Neutrophils Absolute 15.0 (H) 1.4 - 7.0 x10E3/uL   Lymphocytes Absolute 0.7 0.7 - 3.1 x10E3/uL   Monocytes Absolute 0.6 0.1 - 0.9 x10E3/uL   EOS (ABSOLUTE) 0.0 0.0 - 0.4 x10E3/uL   Basophils Absolute 0.0 0.0 - 0.2 x10E3/uL   Immature Granulocytes 1 Not Estab. %   Immature Grans (Abs) 0.1 0.0 - 0.1 x10E3/uL  Comprehensive metabolic panel  Result Value Ref Range   Glucose 157 (H) 70 - 99 mg/dL   BUN 18 6 - 24 mg/dL   Creatinine, Ser 9.56 0.76 - 1.27 mg/dL   eGFR 70 >38 VF/IEP/3.29   BUN/Creatinine Ratio 15 9 - 20   Sodium 138 134 - 144 mmol/L   Potassium 4.2 3.5 - 5.2 mmol/L   Chloride 104 96 - 106 mmol/L   CO2 18 (L) 20 - 29 mmol/L   Calcium 9.5 8.7 - 10.2 mg/dL   Total Protein 7.1 6.0 - 8.5 g/dL   Albumin 4.0 3.8 - 4.9 g/dL   Globulin, Total 3.1 1.5 - 4.5 g/dL   Albumin/Globulin Ratio 1.3 1.2 - 2.2   Bilirubin Total 0.4 0.0 - 1.2 mg/dL   Alkaline Phosphatase 99 44 - 121 IU/L   AST 19 0 - 40 IU/L   ALT 25 0 - 44 IU/L  VITAMIN D 25 Hydroxy (Vit-D Deficiency, Fractures)  Result Value Ref Range  Vit D, 25-Hydroxy 25.4 (L) 30.0 - 100.0 ng/mL  Lyme Disease Serology w/Reflex   Result Value Ref Range   Lyme Total Antibody EIA Negative Negative  Spotted Fever Group Antibodies  Result Value Ref Range   Spotted Fever Group IgG <1:64 Neg:<1:64   Spotted Fever Group IgM <1:64 Neg:<1:64   Result Comment Comment   Ehrlichia Antibody Panel  Result Value Ref Range   E.Chaffeensis (HME) IgG Negative Neg:<1:64   E. Chaffeensis (HME) IgM Titer Negative Neg:<1:20   HGE IgG Titer Negative Neg:<1:64   HGE IgM Titer Negative Neg:<1:20   Result Comment: Comment       Assessment & Plan:   Problem List Items Addressed This Visit     Acute pain of right shoulder - Primary    Acute, ongoing. Recommend Flexeril 5 MG TID with Voltaren gel as needed. Provided information on neck and shoulder exercises and stretches to complete. Informed if no improvement in 1-2 weeks, will do neck and shoulder Xray. Call sooner if concerns arise.       Other Visit Diagnoses     Acute ear pain, right       Acute, likely caused by radiation from shoulder and neck pain. Negative for signs of ear infection.        Follow up plan: Return if symptoms worsen or fail to improve.

## 2023-02-25 NOTE — Assessment & Plan Note (Signed)
Acute, ongoing. Recommend Flexeril 5 MG TID with Voltaren gel as needed. Provided information on neck and shoulder exercises and stretches to complete. Informed if no improvement in 1-2 weeks, will do neck and shoulder Xray. Call sooner if concerns arise.

## 2023-02-25 NOTE — Patient Instructions (Signed)
Apply Voltaren gel on shoulder and neck area Try muscle relaxer daily and at night if it makes you sleepy

## 2023-03-11 ENCOUNTER — Ambulatory Visit: Payer: BC Managed Care – PPO | Admitting: Family Medicine

## 2023-03-15 ENCOUNTER — Other Ambulatory Visit: Payer: Self-pay | Admitting: Physician Assistant

## 2023-03-15 DIAGNOSIS — I10 Essential (primary) hypertension: Secondary | ICD-10-CM

## 2023-03-17 NOTE — Telephone Encounter (Signed)
Requested Prescriptions  Pending Prescriptions Disp Refills   losartan (COZAAR) 25 MG tablet [Pharmacy Med Name: LOSARTAN POTASSIUM 25 MG TAB] 90 tablet 0    Sig: TAKE 1 TABLET (25 MG TOTAL) BY MOUTH DAILY.     Cardiovascular:  Angiotensin Receptor Blockers Passed - 03/15/2023  5:06 PM      Passed - Cr in normal range and within 180 days    Creatinine, Ser  Date Value Ref Range Status  09/17/2022 1.21 0.76 - 1.27 mg/dL Final         Passed - K in normal range and within 180 days    Potassium  Date Value Ref Range Status  09/17/2022 4.2 3.5 - 5.2 mmol/L Final         Passed - Patient is not pregnant      Passed - Last BP in normal range    BP Readings from Last 1 Encounters:  02/25/23 110/72         Passed - Valid encounter within last 6 months    Recent Outpatient Visits           2 weeks ago Acute pain of right shoulder   [place] [facility] [place], Sherran Needs, NP   6 months ago Oropharyngeal dysphagia   [facility] [place], Megan P, DO   6 months ago Gout of left knee, unspecified cause, unspecified chronicity   [facility], Megan P, DO   7 months ago Routine general medical examination at a health care facility   [facility] [place] P, DO   9 months ago Idiopathic chronic gout of right ankle without tophus   [facility] Gabriel Cirri, NP

## 2023-03-30 NOTE — Patient Instructions (Signed)
Shoulder Pain Many things can cause shoulder pain, including: An injury. Moving the shoulder in the same way again and again (overuse). Joint pain (arthritis). Pain can come from: Swelling and irritation (inflammation) of any part of the shoulder. An injury to: The shoulder joint. Tissues that connect muscle to bone (tendons). Tissues that connect bones to each other (ligaments). Bones. Follow these instructions at home: Watch for changes in your symptoms. Let your doctor know about them. Follow these instructions to help with your pain. If you have a sling that can be taken off: Wear the sling as told by your doctor. Take it off only as told by your doctor. Check the skin around the sling every day. Tell your doctor if you see problems. Loosen the sling if your fingers: Tingle. Become numb. Become cold. Keep the sling clean. If the sling is not waterproof: Do not let it get wet. Take the sling off when you shower or bathe. Managing pain, stiffness, and swelling  If told, put ice on the painful area. Put ice in a plastic bag. Place a towel between your skin and the bag. Leave the ice on for 20 minutes, 2-3 times a day. Stop putting ice on if it does not help with the pain. If your skin turns bright red, take off the ice right away to prevent skin damage. The risk of damage is higher if you cannot feel pain, heat, or cold. Squeeze a soft ball or a foam pad as much as possible. This prevents swelling in the shoulder. It also helps to strengthen the arm. General instructions Take over-the-counter and prescription medicines only as told by your doctor. Keep all follow-up visits. This will help you avoid any type of permanent shoulder problems. Contact a doctor if: Your pain gets worse. Medicine does not help your pain. You have new pain in your arm, hand, or fingers. You loosen your sling and your arm, hand, or fingers: Tingle. Are numb. Are swollen. Get help right away  if: Your arm, hand, or fingers turn white or blue. This information is not intended to replace advice given to you by your health care provider. Make sure you discuss any questions you have with your health care provider. Document Revised: 11/30/2021 Document Reviewed: 11/30/2021 Elsevier Patient Education  2024 [employer]  

## 2023-03-31 ENCOUNTER — Encounter: Payer: Self-pay | Admitting: Nurse Practitioner

## 2023-03-31 ENCOUNTER — Ambulatory Visit: Payer: BC Managed Care – PPO | Admitting: Nurse Practitioner

## 2023-03-31 VITALS — BP 135/79 | HR 56 | Temp 97.8°F | Wt 163.8 lb

## 2023-03-31 DIAGNOSIS — H6691 Otitis media, unspecified, right ear: Secondary | ICD-10-CM | POA: Insufficient documentation

## 2023-03-31 DIAGNOSIS — H6501 Acute serous otitis media, right ear: Secondary | ICD-10-CM

## 2023-03-31 DIAGNOSIS — H6991 Unspecified Eustachian tube disorder, right ear: Secondary | ICD-10-CM | POA: Insufficient documentation

## 2023-03-31 MED ORDER — PREDNISONE 20 MG PO TABS: 40.0000 mg | ORAL_TABLET | Freq: Every day | ORAL | 0 refills | Status: AC

## 2023-03-31 MED ORDER — AMOXICILLIN-POT CLAVULANATE 875-125 MG PO TABS: 1.0000 | ORAL_TABLET | Freq: Two times a day (BID) | ORAL | 0 refills | Status: AC

## 2023-03-31 NOTE — Progress Notes (Signed)
BP 135/79   Pulse (!) 56   Temp 97.8 F (36.6 C) (Oral)   Wt 163 lb 12.8 oz (74.3 kg)   SpO2 98%   BMI 24.83 kg/m    Subjective:    Patient ID: Sean Ade., male    DOB: 1966/05/12, 57 y.o.   MRN: [id number]  HPI: Sean Delacruz. is a 57 y.o. male  Chief Complaint  Patient presents with   Pain    Patient states he has been having off and on pain from his R ear down into his R shoulder. States this has been going on for the last month. States he feels like he has to pop his ear, like when the pressure changes, to make it feel better.    EAR PAIN (RIGHT) Having right ear pain that radiates to shoulder.  Is having to pop ear constantly.  No recent illness.  Was seen a month ago for this and given muscle relaxer and Diclofenac gel -- these do not help.  No rashes that he has noticed and no fever.  No recent illness.  If pops ear it will go away. Duration: weeks Involved ear(s): right Severity:  2/10 at present and 8/10 at worst Quality:  dull, aching, and throbbing Fever: no Otorrhea: no Upper respiratory infection symptoms: no Pruritus: yes Hearing loss: no Water immersion no Using Q-tips:  occasional Recurrent otitis media: no Status: fluctuating Treatments attempted: muscle relaxer and Diclofenac gel   Relevant past medical, surgical, family and social history reviewed and updated as indicated. Interim medical history since our last visit reviewed. Allergies and medications reviewed and updated.  Review of Systems  Constitutional:  Negative for activity change, appetite change, chills, fatigue and fever.  HENT:  Positive for ear pain (posterior radiates into shoulder). Negative for congestion, ear discharge, hearing loss, postnasal drip, rhinorrhea, sinus pressure, sinus pain and sore throat.   Respiratory:  Negative for cough, chest tightness, shortness of breath and wheezing.   Cardiovascular:  Negative for chest pain, palpitations and leg swelling.   Gastrointestinal: Negative.   Neurological:  Negative for dizziness, syncope, facial asymmetry, speech difficulty, weakness, numbness and headaches.  Psychiatric/Behavioral: Negative.     Per HPI unless specifically indicated above     Objective:    BP 135/79   Pulse (!) 56   Temp 97.8 F (36.6 C) (Oral)   Wt 163 lb 12.8 oz (74.3 kg)   SpO2 98%   BMI 24.83 kg/m   Wt Readings from Last 3 Encounters:  03/31/23 163 lb 12.8 oz (74.3 kg)  02/25/23 164 lb 12.8 oz (74.8 kg)  11/22/22 152 lb (68.9 kg)    Physical Exam Vitals and nursing note reviewed.  Constitutional:      General: He is awake. He is not in acute distress.    Appearance: Normal appearance. He is well-developed and well-groomed. He is not ill-appearing or toxic-appearing.  HENT:     Head: Normocephalic.     Right Ear: Hearing and ear canal normal. A middle ear effusion is present. There is no impacted cerumen. There is mastoid tenderness (mild). Tympanic membrane is injected. Tympanic membrane is not perforated.     Left Ear: Hearing, ear canal and external ear normal. A middle ear effusion is present. There is no impacted cerumen. No mastoid tenderness. Tympanic membrane is not injected or perforated.     Nose:     Right Sinus: No maxillary sinus tenderness or frontal sinus tenderness.  Left Sinus: No maxillary sinus tenderness or frontal sinus tenderness.     Mouth/Throat:     Mouth: Mucous membranes are moist.     Pharynx: Posterior oropharyngeal erythema (mild) present. No pharyngeal swelling, oropharyngeal exudate or postnasal drip.  Eyes:     General: Lids are normal.     Extraocular Movements: Extraocular movements intact.     Conjunctiva/sclera: Conjunctivae normal.  Neck:     Thyroid: No thyromegaly.     Vascular: No carotid bruit.  Cardiovascular:     Rate and Rhythm: Regular rhythm. Bradycardia present.     Heart sounds: Normal heart sounds. No murmur heard.    No gallop.  Pulmonary:      Effort: No accessory muscle usage or respiratory distress.     Breath sounds: Normal breath sounds.  Abdominal:     General: Bowel sounds are normal. There is no distension.     Palpations: Abdomen is soft.     Tenderness: There is no abdominal tenderness.  Musculoskeletal:     Right shoulder: Normal.     Left shoulder: Normal.     Cervical back: Full passive range of motion without pain.     Right lower leg: No edema.     Left lower leg: No edema.  Lymphadenopathy:     Cervical: No cervical adenopathy.  Skin:    General: Skin is warm.     Capillary Refill: Capillary refill takes less than 2 seconds.  Neurological:     Mental Status: He is alert and oriented to person, place, and time.     Cranial Nerves: Cranial nerves 2-12 are intact.     Coordination: Coordination is intact.     Gait: Gait is intact.     Deep Tendon Reflexes: Reflexes are normal and symmetric.     Reflex Scores:      Brachioradialis reflexes are 2+ on the right side and 2+ on the left side.      Patellar reflexes are 2+ on the right side and 2+ on the left side. Psychiatric:        Attention and Perception: Attention normal.        Mood and Affect: Mood normal.        Speech: Speech normal.        Behavior: Behavior normal. Behavior is cooperative.        Thought Content: Thought content normal.     Results for orders placed or performed in visit on 09/17/22  CBC with Differential/Platelet  Result Value Ref Range   WBC 16.4 (H) 3.4 - 10.8 x10E3/uL   RBC 5.04 4.14 - 5.80 x10E6/uL   Hemoglobin 14.6 13.0 - 17.7 g/dL   Hematocrit 45.4 09.8 - 51.0 %   MCV 85 79 - 97 fL   MCH 29.0 26.6 - 33.0 pg   MCHC 34.2 31.5 - 35.7 g/dL   RDW 11.9 14.7 - 82.9 %   Platelets 339 150 - 450 x10E3/uL   Neutrophils 90 Not Estab. %   Lymphs 5 Not Estab. %   Monocytes 4 Not Estab. %   Eos 0 Not Estab. %   Basos 0 Not Estab. %   Neutrophils Absolute 15.0 (H) 1.4 - 7.0 x10E3/uL   Lymphocytes Absolute 0.7 0.7 - 3.1 x10E3/uL    Monocytes Absolute 0.6 0.1 - 0.9 x10E3/uL   EOS (ABSOLUTE) 0.0 0.0 - 0.4 x10E3/uL   Basophils Absolute 0.0 0.0 - 0.2 x10E3/uL   Immature Granulocytes 1 Not Estab. %  Immature Grans (Abs) 0.1 0.0 - 0.1 x10E3/uL  Comprehensive metabolic panel  Result Value Ref Range   Glucose 157 (H) 70 - 99 mg/dL   BUN 18 6 - 24 mg/dL   Creatinine, Ser 1.61 0.76 - 1.27 mg/dL   eGFR 70 >09 UE/AVW/0.98   BUN/Creatinine Ratio 15 9 - 20   Sodium 138 134 - 144 mmol/L   Potassium 4.2 3.5 - 5.2 mmol/L   Chloride 104 96 - 106 mmol/L   CO2 18 (L) 20 - 29 mmol/L   Calcium 9.5 8.7 - 10.2 mg/dL   Total Protein 7.1 6.0 - 8.5 g/dL   Albumin 4.0 3.8 - 4.9 g/dL   Globulin, Total 3.1 1.5 - 4.5 g/dL   Albumin/Globulin Ratio 1.3 1.2 - 2.2   Bilirubin Total 0.4 0.0 - 1.2 mg/dL   Alkaline Phosphatase 99 44 - 121 IU/L   AST 19 0 - 40 IU/L   ALT 25 0 - 44 IU/L  VITAMIN D 25 Hydroxy (Vit-D Deficiency, Fractures)  Result Value Ref Range   Vit D, 25-Hydroxy 25.4 (L) 30.0 - 100.0 ng/mL  Lyme Disease Serology w/Reflex  Result Value Ref Range   Lyme Total Antibody EIA Negative Negative  Spotted Fever Group Antibodies  Result Value Ref Range   Spotted Fever Group IgG <1:64 Neg:<1:64   Spotted Fever Group IgM <1:64 Neg:<1:64   Result Comment Comment   Ehrlichia Antibody Panel  Result Value Ref Range   E.Chaffeensis (HME) IgG Negative Neg:<1:64   E. Chaffeensis (HME) IgM Titer Negative Neg:<1:20   HGE IgG Titer Negative Neg:<1:64   HGE IgM Titer Negative Neg:<1:20   Result Comment: Comment       Assessment & Plan:   Problem List Items Addressed This Visit       Nervous and Auditory   Eustachian tube disorder, right    Acute, ongoing for one month.  Educated patient on this.  Suspect some TMJ irritation too.  Prednisone 40 MG daily for 5 days sent.  Recommend he take Diclofenac gel and rub to TMJ area for discomfort.      Otitis media, right - Primary    Acute and ongoing for weeks.  Moderate erythema noted  on exam.  No red flags.  TM intact.  Start Augmentin BID for 7 days.  Will have him return in one week for recheck.      Relevant Medications   amoxicillin-clavulanate (AUGMENTIN) 875-125 MG tablet     Follow up plan: Return in about 1 week (around 04/07/2023) for EAR RECHECK.

## 2023-03-31 NOTE — Assessment & Plan Note (Signed)
Acute and ongoing for weeks.  Moderate erythema noted on exam.  No red flags.  TM intact.  Start Augmentin BID for 7 days.  Will have him return in one week for recheck.

## 2023-03-31 NOTE — Assessment & Plan Note (Signed)
Acute, ongoing for one month.  Educated patient on this.  Suspect some TMJ irritation too.  Prednisone 40 MG daily for 5 days sent.  Recommend he take Diclofenac gel and rub to TMJ area for discomfort.

## 2023-04-08 ENCOUNTER — Ambulatory Visit: Payer: BC Managed Care – PPO | Admitting: Family Medicine

## 2023-06-09 ENCOUNTER — Other Ambulatory Visit: Payer: Self-pay | Admitting: Family Medicine

## 2023-06-09 DIAGNOSIS — M1A09X0 Idiopathic chronic gout, multiple sites, without tophus (tophi): Secondary | ICD-10-CM

## 2023-06-11 NOTE — Telephone Encounter (Signed)
Requested Prescriptions  Pending Prescriptions Disp Refills   colchicine 0.6 MG tablet [Pharmacy Med Name: colchicine 0.6 mg tablet (COLCRYS)] 60 tablet 0    Sig: Take 1 tablet (0.6 mg total) by mouth daily as needed.     Endocrinology:  Gout Agents - colchicine Passed - 06/11/2023  2:04 PM      Passed - Cr in normal range and within 360 days    Creatinine, Ser  Date Value Ref Range Status  09/17/2022 1.21 0.76 - 1.27 mg/dL Final         Passed - ALT in normal range and within 360 days    ALT  Date Value Ref Range Status  09/17/2022 25 0 - 44 IU/L Final         Passed - AST in normal range and within 360 days    AST  Date Value Ref Range Status  09/17/2022 19 0 - 40 IU/L Final         Passed - Valid encounter within last 12 months    Recent Outpatient Visits           2 months ago Non-recurrent acute serous otitis media of right ear   [place] [facility] [place], Corrie Dandy T, NP   3 months ago Acute pain of right shoulder   [place] [facility] [place], Sherran Needs, NP   8 months ago Oropharyngeal dysphagia   [place] [facility] Kenn[place], Megan P, DO   9 months ago Gout of left knee, unspecified cause, unspecified chronicity    [facility] [place], Megan P, DO   10 months ago Routine general medical examination at a health care facility   [facility], Megan P, DO              Passed - CBC within normal limits and completed in the last 12 months    WBC  Date Value Ref Range Status  09/17/2022 16.4 (H) 3.4 - 10.8 x10E3/uL Final  11/09/2020 7.5 4.0 - 10.5 K/uL Final   RBC  Date Value Ref Range Status  09/17/2022 5.04 4.14 - 5.80 x10E6/uL Final  11/09/2020 5.09 4.22 - 5.81 MIL/uL Final   Hemoglobin  Date Value Ref Range Status  09/17/2022 14.6 13.0 - 17.7 g/dL Final   Hematocrit  Date Value Ref Range Status  09/17/2022 42.7 37.5 - 51.0 % Final   MCHC   Date Value Ref Range Status  09/17/2022 34.2 31.5 - 35.7 g/dL Final  91/47/8295 62.1 30.0 - 36.0 g/dL Final   [facility]  Date Value Ref Range Status  09/17/2022 29.0 26.6 - 33.0 pg Final  11/09/2020 29.5 26.0 - 34.0 pg Final   MCV  Date Value Ref Range Status  09/17/2022 85 79 - 97 fL Final   No results found for: "PLTCOUNTKUC", "LABPLAT", "POCPLA" RDW  Date Value Ref Range Status  09/17/2022 12.9 11.6 - 15.4 % Final

## 2023-06-16 ENCOUNTER — Other Ambulatory Visit: Payer: Self-pay | Admitting: Family Medicine

## 2023-06-16 DIAGNOSIS — I10 Essential (primary) hypertension: Secondary | ICD-10-CM

## 2023-06-17 NOTE — Telephone Encounter (Signed)
 Patient will need an office visit for further refills. Requested Prescriptions  Pending Prescriptions Disp Refills   losartan  (COZAAR ) 25 MG tablet [Pharmacy Med Name: LOSARTAN  POTASSIUM 25 MG TAB] 90 tablet 0    Sig: TAKE 1 TABLET (25 MG TOTAL) BY MOUTH DAILY.     Cardiovascular:  Angiotensin Receptor Blockers Failed - 06/17/2023  9:54 AM      Failed - Cr in normal range and within 180 days    Creatinine, Ser  Date Value Ref Range Status  09/17/2022 1.21 0.76 - 1.27 mg/dL Final         Failed - K in normal range and within 180 days    Potassium  Date Value Ref Range Status  09/17/2022 4.2 3.5 - 5.2 mmol/L Final         Passed - Patient is not pregnant      Passed - Last BP in normal range    BP Readings from Last 1 Encounters:  03/31/23 135/79         Passed - Valid encounter within last 6 months    Recent Outpatient Visits           2 months ago Non-recurrent acute serous otitis media of right ear   [place] [facility] [place], Melanie T, NP   3 months ago Acute pain of right shoulder   [place] [facility] [place], Hyla Givens, NP   9 months ago Oropharyngeal dysphagia   [place] [facility], Megan P, DO   9 months ago Gout of left knee, unspecified cause, unspecified chronicity   [place] [facility] [place], Megan P, DO   10 months ago Routine general medical examination at a health care facility   [facility] [place], Jeannette, DO

## 2023-08-25 ENCOUNTER — Other Ambulatory Visit: Payer: Self-pay | Admitting: Family Medicine

## 2023-08-25 DIAGNOSIS — M1A09X0 Idiopathic chronic gout, multiple sites, without tophus (tophi): Secondary | ICD-10-CM

## 2023-08-26 NOTE — Telephone Encounter (Signed)
 Last OV 03/31/23 Requested Prescriptions  Pending Prescriptions Disp Refills   colchicine 0.6 MG tablet [Pharmacy Med Name: colchicine 0.6 mg tablet (COLCRYS)] 60 tablet 0    Sig: Take 1 tablet (0.6 mg total) by mouth daily as needed.     Endocrinology:  Gout Agents - colchicine Failed - 08/26/2023  2:24 PM      Failed - Valid encounter within last 12 months    Recent Outpatient Visits   None            Passed - Cr in normal range and within 360 days    Creatinine, Ser  Date Value Ref Range Status  09/17/2022 1.21 0.76 - 1.27 mg/dL Final         Passed - ALT in normal range and within 360 days    ALT  Date Value Ref Range Status  09/17/2022 25 0 - 44 IU/L Final         Passed - AST in normal range and within 360 days    AST  Date Value Ref Range Status  09/17/2022 19 0 - 40 IU/L Final         Passed - CBC within normal limits and completed in the last 12 months    WBC  Date Value Ref Range Status  09/17/2022 16.4 (H) 3.4 - 10.8 x10E3/uL Final  11/09/2020 7.5 4.0 - 10.5 K/uL Final   RBC  Date Value Ref Range Status  09/17/2022 5.04 4.14 - 5.80 x10E6/uL Final  11/09/2020 5.09 4.22 - 5.81 MIL/uL Final   Hemoglobin  Date Value Ref Range Status  09/17/2022 14.6 13.0 - 17.7 g/dL Final   Hematocrit  Date Value Ref Range Status  09/17/2022 42.7 37.5 - 51.0 % Final   MCHC  Date Value Ref Range Status  09/17/2022 34.2 31.5 - 35.7 g/dL Final  03/47/4259 56.3 30.0 - 36.0 g/dL Final   [facility]  Date Value Ref Range Status  09/17/2022 29.0 26.6 - 33.0 pg Final  11/09/2020 29.5 26.0 - 34.0 pg Final   MCV  Date Value Ref Range Status  09/17/2022 85 79 - 97 fL Final   No results found for: "PLTCOUNTKUC", "LABPLAT", "POCPLA" RDW  Date Value Ref Range Status  09/17/2022 12.9 11.6 - 15.4 % Final

## 2023-09-23 ENCOUNTER — Other Ambulatory Visit: Payer: Self-pay | Admitting: Family Medicine

## 2023-09-23 DIAGNOSIS — M1A09X0 Idiopathic chronic gout, multiple sites, without tophus (tophi): Secondary | ICD-10-CM

## 2023-09-24 NOTE — Telephone Encounter (Signed)
 Patient is overdue for a follow up visit. Please call to schedule and then route to provider for refill.

## 2023-09-25 NOTE — Telephone Encounter (Signed)
 Appointment has been made for 6/17 and added to the wait list

## 2023-09-28 ENCOUNTER — Other Ambulatory Visit: Payer: Self-pay | Admitting: Family Medicine

## 2023-09-28 DIAGNOSIS — I10 Essential (primary) hypertension: Secondary | ICD-10-CM

## 2023-09-30 NOTE — Telephone Encounter (Signed)
 Requested medication (s) are due for refill today - yes  Requested medication (s) are on the active medication list -yes  Future visit scheduled -yes  Last refill: 06/17/23 #90  Notes to clinic: fails lab protocol- over 1 year-09/17/22  Requested Prescriptions  Pending Prescriptions Disp Refills   losartan  (COZAAR ) 25 MG tablet [Pharmacy Med Name: LOSARTAN  POTASSIUM 25 MG TAB] 90 tablet 0    Sig: TAKE 1 TABLET (25 MG TOTAL) BY MOUTH DAILY.     Cardiovascular:  Angiotensin Receptor Blockers Failed - 09/30/2023  2:36 PM      Failed - Cr in normal range and within 180 days    Creatinine, Ser  Date Value Ref Range Status  09/17/2022 1.21 0.76 - 1.27 mg/dL Final         Failed - K in normal range and within 180 days    Potassium  Date Value Ref Range Status  09/17/2022 4.2 3.5 - 5.2 mmol/L Final         Failed - Valid encounter within last 6 months    Recent Outpatient Visits   None            Passed - Patient is not pregnant      Passed - Last BP in normal range    BP Readings from Last 1 Encounters:  03/31/23 135/79            Requested Prescriptions  Pending Prescriptions Disp Refills   losartan  (COZAAR ) 25 MG tablet [Pharmacy Med Name: LOSARTAN  POTASSIUM 25 MG TAB] 90 tablet 0    Sig: TAKE 1 TABLET (25 MG TOTAL) BY MOUTH DAILY.     Cardiovascular:  Angiotensin Receptor Blockers Failed - 09/30/2023  2:36 PM      Failed - Cr in normal range and within 180 days    Creatinine, Ser  Date Value Ref Range Status  09/17/2022 1.21 0.76 - 1.27 mg/dL Final         Failed - K in normal range and within 180 days    Potassium  Date Value Ref Range Status  09/17/2022 4.2 3.5 - 5.2 mmol/L Final         Failed - Valid encounter within last 6 months    Recent Outpatient Visits   None            Passed - Patient is not pregnant      Passed - Last BP in normal range    BP Readings from Last 1 Encounters:  03/31/23 135/79

## 2023-09-30 NOTE — Telephone Encounter (Signed)
 Patient will be out before appointment next month.

## 2023-10-28 ENCOUNTER — Encounter: Payer: Self-pay | Admitting: Family Medicine

## 2023-10-28 ENCOUNTER — Ambulatory Visit: Payer: Self-pay | Admitting: Family Medicine

## 2023-10-28 VITALS — BP 125/86 | HR 60 | Temp 97.9°F | Ht 69.0 in | Wt 166.8 lb

## 2023-10-28 DIAGNOSIS — M1A0710 Idiopathic chronic gout, right ankle and foot, without tophus (tophi): Secondary | ICD-10-CM

## 2023-10-28 DIAGNOSIS — K2100 Gastro-esophageal reflux disease with esophagitis, without bleeding: Secondary | ICD-10-CM | POA: Diagnosis not present

## 2023-10-28 DIAGNOSIS — E785 Hyperlipidemia, unspecified: Secondary | ICD-10-CM | POA: Diagnosis not present

## 2023-10-28 DIAGNOSIS — M1A09X0 Idiopathic chronic gout, multiple sites, without tophus (tophi): Secondary | ICD-10-CM | POA: Diagnosis not present

## 2023-10-28 DIAGNOSIS — H6991 Unspecified Eustachian tube disorder, right ear: Secondary | ICD-10-CM

## 2023-10-28 DIAGNOSIS — Z0000 Encounter for general adult medical examination without abnormal findings: Secondary | ICD-10-CM | POA: Diagnosis not present

## 2023-10-28 DIAGNOSIS — I10 Essential (primary) hypertension: Secondary | ICD-10-CM | POA: Diagnosis not present

## 2023-10-28 MED ORDER — LOSARTAN POTASSIUM 25 MG PO TABS
25.0000 mg | ORAL_TABLET | Freq: Every day | ORAL | 1 refills | Status: DC
Start: 2023-10-28 — End: 2024-03-03

## 2023-10-28 MED ORDER — FEBUXOSTAT 40 MG PO TABS: 40.0000 mg | ORAL_TABLET | Freq: Every day | ORAL | 1 refills | Status: DC

## 2023-10-28 MED ORDER — ESOMEPRAZOLE MAGNESIUM 40 MG PO CPDR
40.0000 mg | DELAYED_RELEASE_CAPSULE | Freq: Every day | ORAL | 3 refills | Status: AC
Start: 2023-10-28 — End: 2024-10-22

## 2023-10-28 MED ORDER — COLCHICINE 0.6 MG PO TABS
0.6000 mg | ORAL_TABLET | Freq: Every day | ORAL | 0 refills | Status: DC | PRN
Start: 2023-10-28 — End: 2023-11-26

## 2023-10-28 MED ORDER — PREDNISONE 50 MG PO TABS: 50.0000 mg | ORAL_TABLET | Freq: Every day | ORAL | 0 refills | Status: DC

## 2023-10-28 NOTE — Progress Notes (Signed)
 BP 125/86 (BP Location: Left Arm, Patient Position: Sitting, Cuff Size: Normal)   Pulse 60   Temp 97.9 F (36.6 C) (Oral)   Ht 5' 9 (1.753 m)   Wt 166 lb 12.8 oz (75.7 kg)   SpO2 96%   BMI 24.63 kg/m    Subjective:    Patient ID: Sean Delacruz., male    DOB: Apr 27, 1966, 58 y.o.   MRN: [id number]  HPI: Sean Delacruz. is a 58 y.o. male presenting on 10/28/2023 for comprehensive medical examination. Current medical complaints include:  HYPERTENSION / HYPERLIPIDEMIA Satisfied with current treatment? yes Duration of hypertension: chronic BP monitoring frequency: not checking BP medication side effects: no Past BP meds: losartan  Duration of hyperlipidemia: chronic Cholesterol medication side effects: no Cholesterol supplements: none Past cholesterol medications: none Medication compliance: excellent compliance Aspirin : no Recent stressors: no Recurrent headaches: no Visual changes: no Palpitations: no Dyspnea: no Chest pain: no Lower extremity edema: no Dizzy/lightheaded: no  No gout flares. Tolerating his medicine well. No concerns.   EAG CLOGGED Duration: couple of days Involved ear(s):  right Sensation of feeling clogged/plugged: yes Decreased/muffled hearing:yes Ear pain: yes Fever: no Otorrhea: no Hearing loss: yes Upper respiratory infection symptoms: no Using Q-Tips: no Status: worse History of cerumenosis: no Treatments attempted: none   He currently lives with: wife Interim Problems from his last visit: no  Depression Screen done today and results listed below:     10/28/2023    4:03 PM 03/31/2023   10:53 AM 02/25/2023   11:19 AM 09/05/2022   10:09 AM 08/09/2022   11:28 AM  Depression screen PHQ 2/9  Decreased Interest 0 0 0 0 0  Down, Depressed, Hopeless 0 0 0 0 0  PHQ - 2 Score 0 0 0 0 0  Altered sleeping 0 0 0 0 0  Tired, decreased energy 0 0 0 0 0  Change in appetite 0 0 0 0 0  Feeling bad or failure about yourself  0 0 0 0  0  Trouble concentrating 0 0 3 0 0  Moving slowly or fidgety/restless 0 0 0 0 0  Suicidal thoughts 0 0 0 0 0  PHQ-9 Score 0 0 3 0 0  Difficult doing work/chores  Not difficult at all Not difficult at all Not difficult at all Not difficult at all    Past Medical History:  Past Medical History:  Diagnosis Date   Allergic rhinitis    Dysphagia    GERD (gastroesophageal reflux disease)    Gout    History of cellulitis 11/2013 and 8/15   Hyperlipidemia    Hypertension    TIA (transient ischemic attack) 2022   No deficits    Surgical History:  Past Surgical History:  Procedure Laterality Date   COLONOSCOPY WITH PROPOFOL  N/A 11/22/2022   Procedure: COLONOSCOPY WITH PROPOFOL ;  Surgeon: Marnee Sink, MD;  Location: [facility];  Service: Endoscopy;  Laterality: N/A;   ESOPHAGEAL DILATION  11/22/2022   Procedure: ESOPHAGEAL DILATION;  Surgeon: Marnee Sink, MD;  Location: [facility];  Service: Endoscopy;;   ESOPHAGOGASTRODUODENOSCOPY (EGD) WITH PROPOFOL  N/A 11/22/2022   Procedure: ESOPHAGOGASTRODUODENOSCOPY (EGD) WITH PROPOFOL ;  Surgeon: Marnee Sink, MD;  Location: [facility];  Service: Endoscopy;  Laterality: N/A;   HIATAL HERNIA REPAIR     x3   KNEE ARTHROPLASTY     KNEE SURGERY     POLYPECTOMY  11/22/2022   Procedure: POLYPECTOMY;  Surgeon: Marnee Sink, MD;  Location: [facility];  Service: Endoscopy;;    Medications:  No current outpatient medications on file prior to visit.   No current facility-administered medications on file prior to visit.    Allergies:  Allergies  Allergen Reactions   Shrimp (Diagnostic)     Gout flares up    Social History:  Social History   Socioeconomic History   Marital status: Married    Spouse name: Not on file   Number of children: Not on file   Years of education: Not on file   Highest education level: Not on file  Occupational History   Not on file  Tobacco Use   Smoking status: Former    Current  packs/day: 0.00    Average packs/day: 1.3 packs/day for 8.0 years (10.0 ttl pk-yrs)    Types: Cigarettes    Start date: 05/13/1988    Quit date: 05/13/1996    Years since quitting: 27.4   Smokeless tobacco: Never  Vaping Use   Vaping status: Never Used  Substance and Sexual Activity   Alcohol use: No   Drug use: No   Sexual activity: Never    Birth control/protection: None  Other Topics Concern   Not on file  Social History Narrative   Not on file   Social Drivers of Health   Financial Resource Strain: Not on file  Food Insecurity: Not on file  Transportation Needs: Not on file  Physical Activity: Not on file  Stress: Not on file  Social Connections: Not on file  Intimate Partner Violence: Not on file   Social History   Tobacco Use  Smoking Status Former   Current packs/day: 0.00   Average packs/day: 1.3 packs/day for 8.0 years (10.0 ttl pk-yrs)   Types: Cigarettes   Start date: 05/13/1988   Quit date: 05/13/1996   Years since quitting: 27.4  Smokeless Tobacco Never   Social History   Substance and Sexual Activity  Alcohol Use No    Family History:  Family History  Problem Relation Age of Onset   Hypertension Father    Diabetes Father    Stroke Mother    Hypertension Paternal Grandmother    Myasthenia gravis Other        grandfather   Cancer Maternal Grandmother     Past medical history, surgical history, medications, allergies, family history and social history reviewed with patient today and changes made to appropriate areas of the chart.   Review of Systems  Constitutional: Negative.   HENT:  Positive for ear pain. Negative for congestion, ear discharge, hearing loss, nosebleeds, sinus pain, sore throat and tinnitus.   Eyes: Negative.   Respiratory: Negative.  Negative for stridor.   Cardiovascular: Negative.   Gastrointestinal: Negative.   Genitourinary: Negative.   Musculoskeletal: Negative.   Neurological: Negative.   Endo/Heme/Allergies: Negative.    Psychiatric/Behavioral: Negative.     All other ROS negative except what is listed above and in the HPI.      Objective:    BP 125/86 (BP Location: Left Arm, Patient Position: Sitting, Cuff Size: Normal)   Pulse 60   Temp 97.9 F (36.6 C) (Oral)   Ht 5' 9 (1.753 m)   Wt 166 lb 12.8 oz (75.7 kg)   SpO2 96%   BMI 24.63 kg/m   Wt Readings from Last 3 Encounters:  10/28/23 166 lb 12.8 oz (75.7 kg)  03/31/23 163 lb 12.8 oz (74.3 kg)  02/25/23 164 lb 12.8 oz (74.8 kg)    Physical Exam Vitals  and nursing note reviewed.  Constitutional:      General: He is not in acute distress.    Appearance: Normal appearance. He is not ill-appearing, toxic-appearing or diaphoretic.  HENT:     Head: Normocephalic and atraumatic.     Right Ear: Tympanic membrane, ear canal and external ear normal. There is no impacted cerumen.     Left Ear: Tympanic membrane, ear canal and external ear normal. There is no impacted cerumen.     Nose: Nose normal. No congestion or rhinorrhea.     Mouth/Throat:     Mouth: Mucous membranes are moist.     Pharynx: Oropharynx is clear. No oropharyngeal exudate or posterior oropharyngeal erythema.   Eyes:     General: No scleral icterus.       Right eye: No discharge.        Left eye: No discharge.     Extraocular Movements: Extraocular movements intact.     Conjunctiva/sclera: Conjunctivae normal.     Pupils: Pupils are equal, round, and reactive to light.   Neck:     Vascular: No carotid bruit.   Cardiovascular:     Rate and Rhythm: Normal rate and regular rhythm.     Pulses: Normal pulses.     Heart sounds: No murmur heard.    No friction rub. No gallop.  Pulmonary:     Effort: Pulmonary effort is normal. No respiratory distress.     Breath sounds: Normal breath sounds. No stridor. No wheezing, rhonchi or rales.  Chest:     Chest wall: No tenderness.  Abdominal:     General: Abdomen is flat. Bowel sounds are normal. There is no distension.      Palpations: Abdomen is soft. There is no mass.     Tenderness: There is no abdominal tenderness. There is no right CVA tenderness, left CVA tenderness, guarding or rebound.     Hernia: No hernia is present.  Genitourinary:    Comments: Genital exam deferred with shared decision making  Musculoskeletal:        General: No swelling, tenderness, deformity or signs of injury.     Cervical back: Normal range of motion and neck supple. No rigidity. No muscular tenderness.     Right lower leg: No edema.     Left lower leg: No edema.  Lymphadenopathy:     Cervical: No cervical adenopathy.   Skin:    General: Skin is warm and dry.     Capillary Refill: Capillary refill takes less than 2 seconds.     Coloration: Skin is not jaundiced or pale.     Findings: No bruising, erythema, lesion or rash.   Neurological:     General: No focal deficit present.     Mental Status: He is alert and oriented to person, place, and time.     Cranial Nerves: No cranial nerve deficit.     Sensory: No sensory deficit.     Motor: No weakness.     Coordination: Coordination normal.     Gait: Gait normal.     Deep Tendon Reflexes: Reflexes normal.   Psychiatric:        Mood and Affect: Mood normal.        Behavior: Behavior normal.        Thought Content: Thought content normal.        Judgment: Judgment normal.     Results for orders placed or performed in visit on 10/28/23  Microalbumin, Urine Waived   Collection  Time: 10/28/23  4:21 PM  Result Value Ref Range   Microalb, Ur Waived 30 (H) 0 - 19 mg/L   Creatinine, Urine Waived 300 10 - 300 mg/dL   Microalb/Creat Ratio <30 <30 mg/g  Comprehensive metabolic panel with GFR   Collection Time: 10/28/23  4:22 PM  Result Value Ref Range   Glucose 77 70 - 99 mg/dL   BUN 13 6 - 24 mg/dL   Creatinine, Ser 1.61 (H) 0.76 - 1.27 mg/dL   eGFR 61 >09 UE/AVW/0.98   BUN/Creatinine Ratio 10 9 - 20   Sodium 142 134 - 144 mmol/L   Potassium 4.1 3.5 - 5.2 mmol/L    Chloride 106 96 - 106 mmol/L   CO2 21 20 - 29 mmol/L   Calcium  9.5 8.7 - 10.2 mg/dL   Total Protein 7.1 6.0 - 8.5 g/dL   Albumin 4.2 3.8 - 4.9 g/dL   Globulin, Total 2.9 1.5 - 4.5 g/dL   Bilirubin Total 0.4 0.0 - 1.2 mg/dL   Alkaline Phosphatase 90 44 - 121 IU/L   AST 34 0 - 40 IU/L   ALT 41 0 - 44 IU/L  CBC with Differential/Platelet   Collection Time: 10/28/23  4:22 PM  Result Value Ref Range   WBC 5.8 3.4 - 10.8 x10E3/uL   RBC 5.11 4.14 - 5.80 x10E6/uL   Hemoglobin 15.1 13.0 - 17.7 g/dL   Hematocrit 11.9 14.7 - 51.0 %   MCV 90 79 - 97 fL   MCH 29.5 26.6 - 33.0 pg   MCHC 32.8 31.5 - 35.7 g/dL   RDW 82.9 56.2 - 13.0 %   Platelets 242 150 - 450 x10E3/uL   Neutrophils 53 Not Estab. %   Lymphs 32 Not Estab. %   Monocytes 12 Not Estab. %   Eos 2 Not Estab. %   Basos 1 Not Estab. %   Neutrophils Absolute 3.1 1.4 - 7.0 x10E3/uL   Lymphocytes Absolute 1.8 0.7 - 3.1 x10E3/uL   Monocytes Absolute 0.7 0.1 - 0.9 x10E3/uL   EOS (ABSOLUTE) 0.1 0.0 - 0.4 x10E3/uL   Basophils Absolute 0.1 0.0 - 0.2 x10E3/uL   Immature Granulocytes 0 Not Estab. %   Immature Grans (Abs) 0.0 0.0 - 0.1 x10E3/uL  Lipid Panel w/o Chol/HDL Ratio   Collection Time: 10/28/23  4:22 PM  Result Value Ref Range   Cholesterol, Total 213 (H) 100 - 199 mg/dL   Triglycerides 865 (H) 0 - 149 mg/dL   HDL 35 (L) >78 mg/dL   VLDL Cholesterol Cal 58 (H) 5 - 40 mg/dL   LDL Chol Calc (NIH) 469 (H) 0 - 99 mg/dL  PSA   Collection Time: 10/28/23  4:22 PM  Result Value Ref Range   Prostate Specific Ag, Serum 1.5 0.0 - 4.0 ng/mL  TSH   Collection Time: 10/28/23  4:22 PM  Result Value Ref Range   TSH 1.130 0.450 - 4.500 uIU/mL  Uric acid   Collection Time: 10/28/23  4:22 PM  Result Value Ref Range   Uric Acid 9.1 (H) 3.8 - 8.4 mg/dL      Assessment & Plan:   Problem List Items Addressed This Visit       Cardiovascular and Mediastinum   Hypertension   Under good control on current regimen. Continue current regimen.  Continue to monitor. Call with any concerns. Refills given. Labs drawn today.        Relevant Medications   losartan  (COZAAR ) 25 MG tablet   Other Relevant  Orders   Comprehensive metabolic panel with GFR (Completed)   CBC with Differential/Platelet (Completed)   TSH (Completed)   Microalbumin, Urine Waived (Completed)     Nervous and Auditory   Eustachian tube disorder, right   Will treat with burst of prednisone . Call if not getting better or getting worse.         Other   Hyperlipidemia   Rechecking labs today. Await results. Treat as needed.       Relevant Medications   losartan  (COZAAR ) 25 MG tablet   Other Relevant Orders   Comprehensive metabolic panel with GFR (Completed)   CBC with Differential/Platelet (Completed)   Lipid Panel w/o Chol/HDL Ratio (Completed)   Gout   Under good control on current regimen. Continue current regimen. Continue to monitor. Call with any concerns. Refills given. Labs drawn today.       Relevant Medications   colchicine  0.6 MG tablet   Other Relevant Orders   Uric acid (Completed)   Other Visit Diagnoses       Routine general medical examination at a health care facility    -  Primary   Vaccines up to date. Screening labs checked today. Colonoscopy up to date. Continue diet and exercise. Call with any concerns.   Relevant Orders   Comprehensive metabolic panel with GFR (Completed)   CBC with Differential/Platelet (Completed)   Lipid Panel w/o Chol/HDL Ratio (Completed)   PSA (Completed)   TSH (Completed)     Gastroesophageal reflux disease with esophagitis without hemorrhage       Relevant Medications   esomeprazole  (NEXIUM ) 40 MG capsule        LABORATORY TESTING:  Health maintenance labs ordered today as discussed above.   The natural history of prostate cancer and ongoing controversy regarding screening and potential treatment outcomes of prostate cancer has been discussed with the patient. The meaning of a false  positive PSA and a false negative PSA has been discussed. He indicates understanding of the limitations of this screening test and wishes to proceed with screening PSA testing.   IMMUNIZATIONS:   - Tdap: Tetanus vaccination status reviewed: last tetanus booster within 10 years. - Influenza: Postponed to flu season - Pneumovax: Not applicable - Prevnar: Not applicable - COVID: Refused - HPV: Not applicable - Shingrix vaccine: Refused  SCREENING: - Colonoscopy: Up to date  Discussed with patient purpose of the colonoscopy is to detect colon cancer at curable precancerous or early stages   PATIENT COUNSELING:    Sexuality: Discussed sexually transmitted diseases, partner selection, use of condoms, avoidance of unintended pregnancy  and contraceptive alternatives.   Advised to avoid cigarette smoking.  I discussed with the patient that most people either abstain from alcohol or drink within safe limits (<=14/week and <=4 drinks/occasion for males, <=7/weeks and <= 3 drinks/occasion for females) and that the risk for alcohol disorders and other health effects rises proportionally with the number of drinks per week and how often a drinker exceeds daily limits.  Discussed cessation/primary prevention of drug use and availability of treatment for abuse.   Diet: Encouraged to adjust caloric intake to maintain  or achieve ideal body weight, to reduce intake of dietary saturated fat and total fat, to limit sodium intake by avoiding high sodium foods and not adding table salt, and to maintain adequate dietary potassium and calcium  preferably from fresh fruits, vegetables, and low-fat dairy products.    stressed the importance of regular exercise  Injury prevention: Discussed safety belts, safety helmets,  smoke detector, smoking near bedding or upholstery.   Dental health: Discussed importance of regular tooth brushing, flossing, and dental visits.   Follow up plan: NEXT PREVENTATIVE PHYSICAL  DUE IN 1 YEAR. Return in about 6 months (around 04/28/2024).

## 2023-10-29 ENCOUNTER — Ambulatory Visit: Payer: Self-pay | Admitting: Family Medicine

## 2023-10-29 ENCOUNTER — Other Ambulatory Visit: Payer: Self-pay | Admitting: Family Medicine

## 2023-10-29 DIAGNOSIS — I10 Essential (primary) hypertension: Secondary | ICD-10-CM

## 2023-10-29 LAB — LIPID PANEL W/O CHOL/HDL RATIO
Cholesterol, Total: 213 mg/dL — ABNORMAL HIGH (ref 100–199)
HDL: 35 mg/dL — ABNORMAL LOW (ref >39)
LDL Chol Calc (NIH): 120 mg/dL — ABNORMAL HIGH (ref 0–99)
Triglycerides: 330 mg/dL — ABNORMAL HIGH (ref 0–149)
VLDL Cholesterol Cal: 58 mg/dL — ABNORMAL HIGH (ref 5–40)

## 2023-10-29 LAB — COMPREHENSIVE METABOLIC PANEL WITH GFR
ALT: 41 IU/L (ref 0–44)
AST: 34 IU/L (ref 0–40)
Albumin: 4.2 g/dL (ref 3.8–4.9)
Alkaline Phosphatase: 90 IU/L (ref 44–121)
BUN/Creatinine Ratio: 10 (ref 9–20)
BUN: 13 mg/dL (ref 6–24)
Bilirubin Total: 0.4 mg/dL (ref 0.0–1.2)
CO2: 21 mmol/L (ref 20–29)
Calcium: 9.5 mg/dL (ref 8.7–10.2)
Chloride: 106 mmol/L (ref 96–106)
Creatinine, Ser: 1.35 mg/dL — ABNORMAL HIGH (ref 0.76–1.27)
Globulin, Total: 2.9 g/dL (ref 1.5–4.5)
Glucose: 77 mg/dL (ref 70–99)
Potassium: 4.1 mmol/L (ref 3.5–5.2)
Sodium: 142 mmol/L (ref 134–144)
Total Protein: 7.1 g/dL (ref 6.0–8.5)
eGFR: 61 mL/min/{1.73_m2} (ref >59)

## 2023-10-29 LAB — CBC WITH DIFFERENTIAL/PLATELET
Basophils Absolute: 0.1 10*3/uL (ref 0.0–0.2)
Basos: 1 %
EOS (ABSOLUTE): 0.1 10*3/uL (ref 0.0–0.4)
Eos: 2 %
Hematocrit: 46.1 % (ref 37.5–51.0)
Hemoglobin: 15.1 g/dL (ref 13.0–17.7)
Immature Grans (Abs): 0 10*3/uL (ref 0.0–0.1)
Immature Granulocytes: 0 %
Lymphocytes Absolute: 1.8 10*3/uL (ref 0.7–3.1)
Lymphs: 32 %
MCH: 29.5 pg (ref 26.6–33.0)
MCHC: 32.8 g/dL (ref 31.5–35.7)
MCV: 90 fL (ref 79–97)
Monocytes Absolute: 0.7 10*3/uL (ref 0.1–0.9)
Monocytes: 12 %
Neutrophils Absolute: 3.1 10*3/uL (ref 1.4–7.0)
Neutrophils: 53 %
Platelets: 242 10*3/uL (ref 150–450)
RBC: 5.11 x10E6/uL (ref 4.14–5.80)
RDW: 13.1 % (ref 11.6–15.4)
WBC: 5.8 10*3/uL (ref 3.4–10.8)

## 2023-10-29 LAB — MICROALBUMIN, URINE WAIVED
Creatinine, Urine Waived: 300 mg/dL (ref 10–300)
Microalb, Ur Waived: 30 mg/L — ABNORMAL HIGH (ref 0–19)
Microalb/Creat Ratio: <30 mg/g (ref <30)

## 2023-10-29 LAB — TSH: TSH: 1.13 u[IU]/mL (ref 0.450–4.500)

## 2023-10-29 LAB — PSA: Prostate Specific Ag, Serum: 1.5 ng/mL (ref 0.0–4.0)

## 2023-10-29 LAB — URIC ACID: Uric Acid: 9.1 mg/dL — ABNORMAL HIGH (ref 3.8–8.4)

## 2023-10-29 MED ORDER — FEBUXOSTAT 40 MG PO TABS: 60.0000 mg | ORAL_TABLET | Freq: Every day | ORAL | 1 refills | Status: DC

## 2023-10-29 NOTE — Assessment & Plan Note (Signed)
 Will treat with burst of prednisone . Call if not getting better or getting worse.

## 2023-10-29 NOTE — Assessment & Plan Note (Signed)
 Rechecking labs today. Await results. Treat as needed.

## 2023-10-29 NOTE — Assessment & Plan Note (Signed)
 Under good control on current regimen. Continue current regimen. Continue to monitor. Call with any concerns. Refills given. Labs drawn today.

## 2023-11-25 ENCOUNTER — Other Ambulatory Visit: Payer: Self-pay | Admitting: Family Medicine

## 2023-11-25 DIAGNOSIS — M1A09X0 Idiopathic chronic gout, multiple sites, without tophus (tophi): Secondary | ICD-10-CM

## 2023-11-26 NOTE — Telephone Encounter (Signed)
 Requested Prescriptions  Pending Prescriptions Disp Refills   colchicine  0.6 MG tablet [Pharmacy Med Name: colchicine  0.6 mg tablet (COLCRYS )] 30 tablet 0    Sig: Take 1 tablet (0.6 mg total) by mouth daily as needed.     Endocrinology:  Gout Agents - colchicine  Failed - 11/26/2023  9:42 AM      Failed - Cr in normal range and within 360 days    Creatinine, Ser  Date Value Ref Range Status  10/28/2023 1.35 (H) 0.76 - 1.27 mg/dL Final         Passed - ALT in normal range and within 360 days    ALT  Date Value Ref Range Status  10/28/2023 41 0 - 44 IU/L Final         Passed - AST in normal range and within 360 days    AST  Date Value Ref Range Status  10/28/2023 34 0 - 40 IU/L Final         Passed - Valid encounter within last 12 months    Recent Outpatient Visits           4 weeks ago Routine general medical examination at a health care facility   [facility], Megan P, DO              Passed - CBC within normal limits and completed in the last 12 months    WBC  Date Value Ref Range Status  10/28/2023 5.8 3.4 - 10.8 x10E3/uL Final  11/09/2020 7.5 4.0 - 10.5 K/uL Final   RBC  Date Value Ref Range Status  10/28/2023 5.11 4.14 - 5.80 x10E6/uL Final  11/09/2020 5.09 4.22 - 5.81 MIL/uL Final   Hemoglobin  Date Value Ref Range Status  10/28/2023 15.1 13.0 - 17.7 g/dL Final   Hematocrit  Date Value Ref Range Status  10/28/2023 46.1 37.5 - 51.0 % Final   MCHC  Date Value Ref Range Status  10/28/2023 32.8 31.5 - 35.7 g/dL Final  93/69/7977 65.4 30.0 - 36.0 g/dL Final   [facility]  Date Value Ref Range Status  10/28/2023 29.5 26.6 - 33.0 pg Final  11/09/2020 29.5 26.0 - 34.0 pg Final   MCV  Date Value Ref Range Status  10/28/2023 90 79 - 97 fL Final   No results found for: PLTCOUNTKUC, LABPLAT, POCPLA RDW  Date Value Ref Range Status  10/28/2023 13.1 11.6 - 15.4 % Final

## 2024-01-23 ENCOUNTER — Other Ambulatory Visit: Payer: Self-pay | Admitting: Family Medicine

## 2024-01-23 DIAGNOSIS — M1A09X0 Idiopathic chronic gout, multiple sites, without tophus (tophi): Secondary | ICD-10-CM

## 2024-01-23 NOTE — Telephone Encounter (Signed)
 Requested medications are due for refill today.  yes  Requested medications are on the active medications list.  yes  Last refill. 11/26/2023 #30 0 rf  Future visit scheduled.   yes  Notes to clinic.  Rx written to expire 01/25/2024.    Requested Prescriptions  Pending Prescriptions Disp Refills   colchicine  0.6 MG tablet [Pharmacy Med Name: colchicine  0.6 mg tablet (COLCRYS )] 30 tablet 0    Sig: Take 1 tablet (0.6 mg total) by mouth daily as needed.     Endocrinology:  Gout Agents - colchicine  Failed - 01/23/2024  3:57 PM      Failed - Cr in normal range and within 360 days    Creatinine, Ser  Date Value Ref Range Status  10/28/2023 1.35 (H) 0.76 - 1.27 mg/dL Final         Passed - ALT in normal range and within 360 days    ALT  Date Value Ref Range Status  10/28/2023 41 0 - 44 IU/L Final         Passed - AST in normal range and within 360 days    AST  Date Value Ref Range Status  10/28/2023 34 0 - 40 IU/L Final         Passed - Valid encounter within last 12 months    Recent Outpatient Visits           2 months ago Routine general medical examination at a health care facility   [facility], Megan P, DO              Passed - CBC within normal limits and completed in the last 12 months    WBC  Date Value Ref Range Status  10/28/2023 5.8 3.4 - 10.8 x10E3/uL Final  11/09/2020 7.5 4.0 - 10.5 K/uL Final   RBC  Date Value Ref Range Status  10/28/2023 5.11 4.14 - 5.80 x10E6/uL Final  11/09/2020 5.09 4.22 - 5.81 MIL/uL Final   Hemoglobin  Date Value Ref Range Status  10/28/2023 15.1 13.0 - 17.7 g/dL Final   Hematocrit  Date Value Ref Range Status  10/28/2023 46.1 37.5 - 51.0 % Final   MCHC  Date Value Ref Range Status  10/28/2023 32.8 31.5 - 35.7 g/dL Final  93/69/7977 65.4 30.0 - 36.0 g/dL Final   [facility]  Date Value Ref Range Status  10/28/2023 29.5 26.6 - 33.0 pg Final  11/09/2020 29.5 26.0 - 34.0 pg Final   MCV   Date Value Ref Range Status  10/28/2023 90 79 - 97 fL Final   No results found for: PLTCOUNTKUC, LABPLAT, POCPLA RDW  Date Value Ref Range Status  10/28/2023 13.1 11.6 - 15.4 % Final

## 2024-02-02 ENCOUNTER — Ambulatory Visit: Payer: Self-pay | Admitting: Family Medicine

## 2024-02-02 ENCOUNTER — Ambulatory Visit: Payer: Self-pay

## 2024-02-02 ENCOUNTER — Ambulatory Visit
Admission: RE | Admit: 2024-02-02 | Discharge: 2024-02-02 | Disposition: A | Discharge status: Home / Self Care | Source: Ambulatory Visit | Attending: Family Medicine | Admitting: Family Medicine

## 2024-02-02 ENCOUNTER — Encounter: Payer: Self-pay | Admitting: Family Medicine

## 2024-02-02 ENCOUNTER — Ambulatory Visit: Admitting: Family Medicine

## 2024-02-02 ENCOUNTER — Telehealth: Payer: Self-pay

## 2024-02-02 ENCOUNTER — Ambulatory Visit: Payer: Self-pay | Admitting: *Deleted

## 2024-02-02 VITALS — BP 148/90 | HR 81 | Ht 69.0 in | Wt 169.6 lb

## 2024-02-02 DIAGNOSIS — H539 Unspecified visual disturbance: Secondary | ICD-10-CM | POA: Insufficient documentation

## 2024-02-02 DIAGNOSIS — H6991 Unspecified Eustachian tube disorder, right ear: Secondary | ICD-10-CM

## 2024-02-02 MED ORDER — FLUTICASONE PROPIONATE 50 MCG/ACT NA SUSP: 2.0000 | Freq: Every day | NASAL | 6 refills | Status: AC

## 2024-02-02 NOTE — Telephone Encounter (Signed)
 PT scheduled for appt with DJ today

## 2024-02-02 NOTE — Progress Notes (Signed)
 BP (!) 148/90   Pulse 81   Ht 5' 9 (1.753 m)   Wt 169 lb 9.6 oz (76.9 kg)   SpO2 98%   BMI 25.05 kg/m    Subjective:    Patient ID: Sean Delacruz., male    DOB: [birth year], 58 y.o.   MRN: [id number]  HPI: Sean Delacruz. is a 58 y.o. male  Chief Complaint  Patient presents with   Ear Pain    Right ear, noticed pain and pressure in the ear as if he was climbing a mountain, symptoms started 2 days ago, losing Periferal vision as of yesterday, pt states it feels as if something is pressing down on the ear inside    EAG CLOGGED Duration: few days Involved ear(s):  right Sensation of feeling clogged/plugged: yes Decreased/muffled hearing:yes Ear pain: no Fever: no Otorrhea: no Hearing loss: no Upper respiratory infection symptoms: no Using Q-Tips: no Status: stable History of cerumenosis: no Treatments attempted: none  Yesterday morning started noticing he couldn't see peripheral vision out of his R eye. Has been having on and off improvement since then- can see light from a window. Otherwise feeling well. No other symptoms.   Relevant past medical, surgical, family and social history reviewed and updated as indicated. Interim medical history since our last visit reviewed. Allergies and medications reviewed and updated.  Review of Systems  Constitutional: Negative.   HENT: Negative.  Negative for congestion, dental problem, drooling, ear discharge, ear pain, facial swelling, hearing loss, mouth sores, nosebleeds, postnasal drip, rhinorrhea, sinus pressure, sinus pain, sneezing, sore throat, tinnitus, trouble swallowing and voice change.   Eyes:  Positive for visual disturbance. Negative for photophobia, pain, discharge, redness and itching.  Respiratory: Negative.    Cardiovascular: Negative.   Musculoskeletal: Negative.   Psychiatric/Behavioral: Negative.      Per HPI unless specifically indicated above     Objective:    BP (!) 148/90   Pulse 81    Ht 5' 9 (1.753 m)   Wt 169 lb 9.6 oz (76.9 kg)   SpO2 98%   BMI 25.05 kg/m   Wt Readings from Last 3 Encounters:  02/02/24 169 lb 9.6 oz (76.9 kg)  10/28/23 166 lb 12.8 oz (75.7 kg)  03/31/23 163 lb 12.8 oz (74.3 kg)    Physical Exam Vitals and nursing note reviewed.  Constitutional:      General: He is not in acute distress.    Appearance: Normal appearance. He is not ill-appearing, toxic-appearing or diaphoretic.  HENT:     Head: Normocephalic and atraumatic.     Right Ear: External ear normal. There is impacted cerumen.     Left Ear: Tympanic membrane, ear canal and external ear normal.     Nose: Nose normal.     Mouth/Throat:     Mouth: Mucous membranes are moist.     Pharynx: Oropharynx is clear.  Eyes:     General: No scleral icterus.       Right eye: No discharge.        Left eye: No discharge.     Extraocular Movements: Extraocular movements intact.     Conjunctiva/sclera: Conjunctivae normal.     Pupils: Pupils are equal, round, and reactive to light.  Cardiovascular:     Rate and Rhythm: Normal rate and regular rhythm.     Pulses: Normal pulses.     Heart sounds: Normal heart sounds. No murmur heard.    No friction rub. No gallop.  Pulmonary:     Effort: Pulmonary effort is normal. No respiratory distress.     Breath sounds: Normal breath sounds. No stridor. No wheezing, rhonchi or rales.  Chest:     Chest wall: No tenderness.  Musculoskeletal:        General: Normal range of motion.     Cervical back: Normal range of motion and neck supple.  Skin:    General: Skin is warm and dry.     Capillary Refill: Capillary refill takes less than 2 seconds.     Coloration: Skin is not jaundiced or pale.     Findings: No bruising, erythema, lesion or rash.  Neurological:     General: No focal deficit present.     Mental Status: He is alert and oriented to person, place, and time. Mental status is at baseline.  Psychiatric:        Mood and Affect: Mood normal.         Behavior: Behavior normal.        Thought Content: Thought content normal.        Judgment: Judgment normal.     Results for orders placed or performed in visit on 10/28/23  Microalbumin, Urine Waived   Collection Time: 10/28/23  4:21 PM  Result Value Ref Range   Microalb, Ur Waived 30 (H) 0 - 19 mg/L   Creatinine, Urine Waived 300 10 - 300 mg/dL   Microalb/Creat Ratio <30 <30 mg/g  Comprehensive metabolic panel with GFR   Collection Time: 10/28/23  4:22 PM  Result Value Ref Range   Glucose 77 70 - 99 mg/dL   BUN 13 6 - 24 mg/dL   Creatinine, Ser 8.64 (H) 0.76 - 1.27 mg/dL   eGFR 61 >40 fO/fpw/8.26   BUN/Creatinine Ratio 10 9 - 20   Sodium 142 134 - 144 mmol/L   Potassium 4.1 3.5 - 5.2 mmol/L   Chloride 106 96 - 106 mmol/L   CO2 21 20 - 29 mmol/L   Calcium  9.5 8.7 - 10.2 mg/dL   Total Protein 7.1 6.0 - 8.5 g/dL   Albumin 4.2 3.8 - 4.9 g/dL   Globulin, Total 2.9 1.5 - 4.5 g/dL   Bilirubin Total 0.4 0.0 - 1.2 mg/dL   Alkaline Phosphatase 90 44 - 121 IU/L   AST 34 0 - 40 IU/L   ALT 41 0 - 44 IU/L  CBC with Differential/Platelet   Collection Time: 10/28/23  4:22 PM  Result Value Ref Range   WBC 5.8 3.4 - 10.8 x10E3/uL   RBC 5.11 4.14 - 5.80 x10E6/uL   Hemoglobin 15.1 13.0 - 17.7 g/dL   Hematocrit 53.8 62.4 - 51.0 %   MCV 90 79 - 97 fL   MCH 29.5 26.6 - 33.0 pg   MCHC 32.8 31.5 - 35.7 g/dL   RDW 86.8 88.3 - 84.5 %   Platelets 242 150 - 450 x10E3/uL   Neutrophils 53 Not Estab. %   Lymphs 32 Not Estab. %   Monocytes 12 Not Estab. %   Eos 2 Not Estab. %   Basos 1 Not Estab. %   Neutrophils Absolute 3.1 1.4 - 7.0 x10E3/uL   Lymphocytes Absolute 1.8 0.7 - 3.1 x10E3/uL   Monocytes Absolute 0.7 0.1 - 0.9 x10E3/uL   EOS (ABSOLUTE) 0.1 0.0 - 0.4 x10E3/uL   Basophils Absolute 0.1 0.0 - 0.2 x10E3/uL   Immature Granulocytes 0 Not Estab. %   Immature Grans (Abs) 0.0 0.0 - 0.1 x10E3/uL  Lipid Panel w/o  Chol/HDL Ratio   Collection Time: 10/28/23  4:22 PM  Result Value Ref  Range   Cholesterol, Total 213 (H) 100 - 199 mg/dL   Triglycerides 669 (H) 0 - 149 mg/dL   HDL 35 (L) >60 mg/dL   VLDL Cholesterol Cal 58 (H) 5 - 40 mg/dL   LDL Chol Calc (NIH) 879 (H) 0 - 99 mg/dL  PSA   Collection Time: 10/28/23  4:22 PM  Result Value Ref Range   Prostate Specific Ag, Serum 1.5 0.0 - 4.0 ng/mL  TSH   Collection Time: 10/28/23  4:22 PM  Result Value Ref Range   TSH 1.130 0.450 - 4.500 uIU/mL  Uric acid   Collection Time: 10/28/23  4:22 PM  Result Value Ref Range   Uric Acid 9.1 (H) 3.8 - 8.4 mg/dL      Assessment & Plan:   Problem List Items Addressed This Visit   None Visit Diagnoses       Visual changes    -  Primary   Concern for stroke. Will get him CT head ASAP. Await results.   Relevant Orders   CT HEAD WO CONTRAST ( )     Dysfunction of right eustachian tube       Will treat with flonanse. Call if not getting better or getting worse.        Follow up plan: Return in about 2 weeks (around 02/16/2024).

## 2024-02-02 NOTE — Telephone Encounter (Signed)
 See NT encounter from earlier today   Copied from CRM #[id number]. Topic: General - Other >> Feb 02, 2024  1:47 PM Dawna HERO wrote: Reason for CRM: pt wife states husband has been waiting on a call back from nurse triage since 9 this morning and hasn't received a call back yet, wants to know an update on a appt   Wife was calling to follow up: because no-one has called back yet.  Nurse read notes of NT from earlier verified s/s and scheduled pt for today with PCP

## 2024-02-02 NOTE — Telephone Encounter (Signed)
 FYI Only or Action Required?: Action required by provider: request for appointment.  Patient was last seen in primary care on 10/28/2023 by Vicci Duwaine SQUIBB, DO.  Called Nurse Triage reporting Otalgia (Ear pain, vision chnages).  Symptoms began several days ago.  Interventions attempted: Nothing.  Symptoms are: vision has improved- but peripheral is still unchanged.  Triage Disposition: See HCP Within 4 Hours (Or PCP Triage), See PCP When Office is Open (Within 3 Days)  Patient/caregiver understands and will follow disposition?: Patient requesting work in appointment with PCP- note sent   Reason for Disposition  [1] Brief (now gone) blurred vision AND [2] unexplained  New blurred vision or vision changes  Answer Assessment - Initial Assessment Questions 1. LOCATION: Which ear is involved?     R ear 2. ONSET: When did the ear pain start?      Saturday 3. SEVERITY: How bad is the pain?  (Scale 1-10; mild, moderate or severe)     Pressure now 4. URI SYMPTOMS: Do you have a runny nose or cough?     no 5. FEVER: Do you have a fever? If Yes, ask: What is your temperature, how was it measured, and when did it start?     no 6. CAUSE: Have you been swimming recently?, How often do you use Q-TIPS?, Have you had any recent air travel or scuba diving?     unsure 7. OTHER SYMPTOMS: Do you have any other symptoms? (e.g., decreased hearing, dizziness, headache, stiff neck, vomiting)     Feels like he is in [place] with ear pressure- patient reports he recently had severe gout flare- worst ever  Answer Assessment - Initial Assessment Questions 1. DESCRIPTION: How has your vision changed? (e.g., complete vision loss, blurred vision, double vision, floaters, etc.)     R eye- blurry vision- Periferal vision has changed 2. LOCATION: One or both eyes? If one, ask: Which eye?     R eye 3. SEVERITY: Can you see anything? If Yes, ask: What can you see? (e.g., fine  print)     Yes- able to read 4. ONSET: When did this begin? Did it start suddenly or has this been gradual?     Late Saturday- sudden 5. PATTERN: Does this come and go, or has it been constant since it started?     constant 6. PAIN: Is there any pain in your eye(s)?  (Scale 1-10; or mild, moderate, severe)     no 7. CONTACTS-GLASSES: Do you wear contacts or glasses?     glasses 8. CAUSE: What do you think is causing this visual problem?     unsure 9. OTHER SYMPTOMS: Do you have any other symptoms? (e.g., confusion, headache, arm or leg weakness, speech problems)     Pressure in ear, headache- gone now, tooth ache- gone now  Protocols used: Earache-A-AH, Vision Loss or Change-A-AH   Copied from CRM [id number]. Topic: Clinical - Red Word Triage >> Feb 02, 2024  9:49 AM Myrick T wrote: Red Word that prompted transfer to Nurse Triage: patient said on Saturday night his right ear started hurting and then his peripheral vision in his right eye was really blurred until its almost gone.

## 2024-02-02 NOTE — Telephone Encounter (Signed)
 Pt scheduled for today with DJ

## 2024-02-02 NOTE — Telephone Encounter (Signed)
 Copied from CRM #[id number]. Topic: Appointments - Scheduling Inquiry for Clinic >> Feb 02, 2024  1:09 PM Rosaria BRAVO wrote: Reason for CRM: Pt returned missed call from office, please advise. Waited on hold then decided he would rather receive a call back

## 2024-02-03 ENCOUNTER — Ambulatory Visit
Admission: RE | Admit: 2024-02-03 | Discharge: 2024-02-03 | Disposition: A | Discharge status: Home / Self Care | Source: Ambulatory Visit | Attending: Family Medicine | Admitting: Family Medicine

## 2024-02-03 ENCOUNTER — Other Ambulatory Visit: Payer: Self-pay | Admitting: Family Medicine

## 2024-02-03 ENCOUNTER — Telehealth: Payer: Self-pay | Admitting: Family Medicine

## 2024-02-03 DIAGNOSIS — I63439 Cerebral infarction due to embolism of unspecified posterior cerebral artery: Secondary | ICD-10-CM

## 2024-02-03 MED ORDER — ATORVASTATIN CALCIUM 40 MG PO TABS: 40.0000 mg | ORAL_TABLET | Freq: Every day | ORAL | 0 refills | Status: DC

## 2024-02-03 MED ORDER — ASPIRIN 81 MG PO TBEC: 81.0000 mg | DELAYED_RELEASE_TABLET | Freq: Every day | ORAL | 0 refills | Status: DC

## 2024-02-03 NOTE — Telephone Encounter (Signed)
 Called by radiology for acute/subacute stroke on MRI. Patient is not with radiologist. Called MRI suite- patient has no additional symptoms and is feeling better- vision is improving, would prefer to avoid ER. Will get him into neurology, start statin and aspirin  and continue to monitor closely.

## 2024-02-16 ENCOUNTER — Encounter: Payer: Self-pay | Admitting: Diagnostic Neuroimaging

## 2024-02-16 ENCOUNTER — Ambulatory Visit: Admitting: Diagnostic Neuroimaging

## 2024-02-18 ENCOUNTER — Ambulatory Visit: Admission: RE | Admit: 2024-02-18 | Source: Ambulatory Visit

## 2024-02-19 ENCOUNTER — Ambulatory Visit: Admitting: Family Medicine

## 2024-02-19 ENCOUNTER — Encounter: Payer: Self-pay | Admitting: Family Medicine

## 2024-02-19 VITALS — BP 120/79 | HR 69 | Temp 97.8°F | Ht 69.0 in | Wt 169.5 lb

## 2024-02-19 DIAGNOSIS — I10 Essential (primary) hypertension: Secondary | ICD-10-CM

## 2024-02-19 DIAGNOSIS — M1A09X0 Idiopathic chronic gout, multiple sites, without tophus (tophi): Secondary | ICD-10-CM

## 2024-02-19 DIAGNOSIS — Z789 Other specified health status: Secondary | ICD-10-CM | POA: Diagnosis not present

## 2024-02-19 DIAGNOSIS — I6930 Unspecified sequelae of cerebral infarction: Secondary | ICD-10-CM

## 2024-02-19 NOTE — Progress Notes (Unsigned)
 BP 120/79   Pulse 69   Temp 97.8 F (36.6 C) (Oral)   Ht 5' 9 (1.753 m)   Wt 169 lb 8 oz (76.9 kg)   SpO2 98%   BMI 25.03 kg/m    Subjective:    Patient ID: Sean FORBES Vita Mickey., male    DOB: 08/19/1965, 58 y.o.   MRN: [id number]  HPI: Sean Thomure. is a 58 y.o. male  Chief Complaint  Patient presents with   Stroke Symptoms   Vision Changes   Did not start his cholesterol medicine- going to pick it up today.  Has not gotten his echo and has not seen his neurologist. No-showed both those appointments and has not rescheduled them yet.  Generally feeling OK. Vision is about the same. He does note that it sometimes is better and it's sometimes worse. No new symptoms. No chest pain, no weakness, no other issues.   GOUT Duration:chronic Right 1st metatarsophalangeal pain: yes Left 1st metatarsophalangeal pain: yes Right knee pain: yes Left knee pain: yes Severity: moderate  Quality: sharp and stabbing Swelling: yes Redness: yes Trauma: no Recent dietary change or indiscretion: no Fevers: no Nausea/vomiting: no Status:  fluctuating Treatments attempted: self- administered higher dose of colchicine .  HYPERTENSION  Hypertension status: controlled  Satisfied with current treatment? yes Duration of hypertension: chronic BP monitoring frequency:  rarely BP medication side effects:  no Medication compliance: excellent compliance Previous BP meds: losartan  Aspirin : yes Recurrent headaches: no Visual changes: no Palpitations: no Dyspnea: no Chest pain: no Lower extremity edema: no Dizzy/lightheaded: no  Relevant past medical, surgical, family and social history reviewed and updated as indicated. Interim medical history since our last visit reviewed. Allergies and medications reviewed and updated.  Review of Systems  Constitutional: Negative.   HENT: Negative.    Eyes:  Positive for visual disturbance. Negative for photophobia, pain, discharge, redness and  itching.  Respiratory: Negative.    Gastrointestinal: Negative.   Musculoskeletal: Negative.   Neurological: Negative.   Psychiatric/Behavioral: Negative.      Per HPI unless specifically indicated above     Objective:    BP 120/79   Pulse 69   Temp 97.8 F (36.6 C) (Oral)   Ht 5' 9 (1.753 m)   Wt 169 lb 8 oz (76.9 kg)   SpO2 98%   BMI 25.03 kg/m   Wt Readings from Last 3 Encounters:  02/19/24 169 lb 8 oz (76.9 kg)  02/02/24 169 lb 9.6 oz (76.9 kg)  10/28/23 166 lb 12.8 oz (75.7 kg)    Physical Exam Vitals and nursing note reviewed.  Constitutional:      General: He is not in acute distress.    Appearance: Normal appearance. He is normal weight. He is not ill-appearing, toxic-appearing or diaphoretic.  HENT:     Head: Normocephalic and atraumatic.     Right Ear: External ear normal.     Left Ear: External ear normal.     Nose: Nose normal.     Mouth/Throat:     Mouth: Mucous membranes are moist.     Pharynx: Oropharynx is clear.  Eyes:     General: No scleral icterus.       Right eye: No discharge.        Left eye: No discharge.     Extraocular Movements: Extraocular movements intact.     Conjunctiva/sclera: Conjunctivae normal.     Pupils: Pupils are equal, round, and reactive to light.  Cardiovascular:  Rate and Rhythm: Normal rate and regular rhythm.     Pulses: Normal pulses.     Heart sounds: Normal heart sounds. No murmur heard.    No friction rub. No gallop.  Pulmonary:     Effort: Pulmonary effort is normal. No respiratory distress.     Breath sounds: Normal breath sounds. No stridor. No wheezing, rhonchi or rales.  Chest:     Chest wall: No tenderness.  Musculoskeletal:        General: Normal range of motion.     Cervical back: Normal range of motion and neck supple.  Skin:    General: Skin is warm and dry.     Capillary Refill: Capillary refill takes less than 2 seconds.     Coloration: Skin is not jaundiced or pale.     Findings: No  bruising, erythema, lesion or rash.  Neurological:     General: No focal deficit present.     Mental Status: He is alert and oriented to person, place, and time. Mental status is at baseline.  Psychiatric:        Mood and Affect: Mood normal.        Behavior: Behavior normal.        Thought Content: Thought content normal.        Judgment: Judgment normal.     Results for orders placed or performed in visit on 02/19/24  Uric acid   Collection Time: 02/19/24  2:26 PM  Result Value Ref Range   Uric Acid 8.1 3.8 - 8.4 mg/dL  Comprehensive metabolic panel with GFR   Collection Time: 02/19/24  2:26 PM  Result Value Ref Range   Glucose 80 70 - 99 mg/dL   BUN 15 6 - 24 mg/dL   Creatinine, Ser 8.64 (H) 0.76 - 1.27 mg/dL   eGFR 61 >40 fO/fpw/8.26   BUN/Creatinine Ratio 11 9 - 20   Sodium 142 134 - 144 mmol/L   Potassium 4.0 3.5 - 5.2 mmol/L   Chloride 104 96 - 106 mmol/L   CO2 22 20 - 29 mmol/L   Calcium  9.6 8.7 - 10.2 mg/dL   Total Protein 6.9 6.0 - 8.5 g/dL   Albumin 4.2 3.8 - 4.9 g/dL   Globulin, Total 2.7 1.5 - 4.5 g/dL   Bilirubin Total 0.5 0.0 - 1.2 mg/dL   Alkaline Phosphatase 96 47 - 123 IU/L   AST 26 0 - 40 IU/L   ALT 28 0 - 44 IU/L  CBC with Differential/Platelet   Collection Time: 02/19/24  2:26 PM  Result Value Ref Range   WBC 6.1 3.4 - 10.8 x10E3/uL   RBC 4.97 4.14 - 5.80 x10E6/uL   Hemoglobin 14.9 13.0 - 17.7 g/dL   Hematocrit 55.0 62.4 - 51.0 %   MCV 90 79 - 97 fL   MCH 30.0 26.6 - 33.0 pg   MCHC 33.2 31.5 - 35.7 g/dL   RDW 87.5 88.3 - 84.5 %   Platelets 252 150 - 450 x10E3/uL   Neutrophils 55 Not Estab. %   Lymphs 29 Not Estab. %   Monocytes 12 Not Estab. %   Eos 2 Not Estab. %   Basos 2 Not Estab. %   Neutrophils Absolute 3.5 1.4 - 7.0 x10E3/uL   Lymphocytes Absolute 1.8 0.7 - 3.1 x10E3/uL   Monocytes Absolute 0.7 0.1 - 0.9 x10E3/uL   EOS (ABSOLUTE) 0.1 0.0 - 0.4 x10E3/uL   Basophils Absolute 0.1 0.0 - 0.2 x10E3/uL   Immature Granulocytes 0 Not  Estab. %   Immature Grans (Abs) 0.0 0.0 - 0.1 x10E3/uL  Hepatitis B surface antibody,quantitative   Collection Time: 02/19/24  2:26 PM  Result Value Ref Range   Hepatitis B Surf Ab Quant <3.5 (L) Immunity>10 mIU/mL      Assessment & Plan:   Problem List Items Addressed This Visit       Cardiovascular and Mediastinum   Hypertension   Under good control on current regimen. Continue current regimen. Continue to monitor. Call with any concerns. Refills given.        Relevant Orders   Comprehensive metabolic panel with GFR (Completed)   CBC with Differential/Platelet (Completed)     Other   Gout   Had flare right around his stroke. Last labs were out of range. Will check labs and treat as needed.       Relevant Orders   Uric acid (Completed)   Comprehensive metabolic panel with GFR (Completed)   CBC with Differential/Platelet (Completed)   History of CVA with residual deficit - Primary   Conitnues to have issues with vision. Encouraged him to keep his appointment with neurology and to get his echocardiogram. Will pick up his statin. Call with any concerns. Recheck in about 2 weeks.       Other Visit Diagnoses       Hepatitis B vaccination status unknown       Labs drawn today. Await results.   Relevant Orders   Hepatitis B surface antibody,quantitative (Completed)        Follow up plan: Return in about 2 weeks (around 03/04/2024).

## 2024-02-20 ENCOUNTER — Ambulatory Visit: Payer: Self-pay | Admitting: Family Medicine

## 2024-02-20 LAB — COMPREHENSIVE METABOLIC PANEL WITH GFR
ALT: 28 IU/L (ref 0–44)
AST: 26 IU/L (ref 0–40)
Albumin: 4.2 g/dL (ref 3.8–4.9)
Alkaline Phosphatase: 96 IU/L (ref 47–123)
BUN/Creatinine Ratio: 11 (ref 9–20)
BUN: 15 mg/dL (ref 6–24)
Bilirubin Total: 0.5 mg/dL (ref 0.0–1.2)
CO2: 22 mmol/L (ref 20–29)
Calcium: 9.6 mg/dL (ref 8.7–10.2)
Chloride: 104 mmol/L (ref 96–106)
Creatinine, Ser: 1.35 mg/dL — ABNORMAL HIGH (ref 0.76–1.27)
Globulin, Total: 2.7 g/dL (ref 1.5–4.5)
Glucose: 80 mg/dL (ref 70–99)
Potassium: 4 mmol/L (ref 3.5–5.2)
Sodium: 142 mmol/L (ref 134–144)
Total Protein: 6.9 g/dL (ref 6.0–8.5)
eGFR: 61 mL/min/1.73 (ref >59)

## 2024-02-20 LAB — CBC WITH DIFFERENTIAL/PLATELET
Basophils Absolute: 0.1 x10E3/uL (ref 0.0–0.2)
Basos: 2 %
EOS (ABSOLUTE): 0.1 x10E3/uL (ref 0.0–0.4)
Eos: 2 %
Hematocrit: 44.9 % (ref 37.5–51.0)
Hemoglobin: 14.9 g/dL (ref 13.0–17.7)
Immature Grans (Abs): 0 x10E3/uL (ref 0.0–0.1)
Immature Granulocytes: 0 %
Lymphocytes Absolute: 1.8 x10E3/uL (ref 0.7–3.1)
Lymphs: 29 %
MCH: 30 pg (ref 26.6–33.0)
MCHC: 33.2 g/dL (ref 31.5–35.7)
MCV: 90 fL (ref 79–97)
Monocytes Absolute: 0.7 x10E3/uL (ref 0.1–0.9)
Monocytes: 12 %
Neutrophils Absolute: 3.5 x10E3/uL (ref 1.4–7.0)
Neutrophils: 55 %
Platelets: 252 x10E3/uL (ref 150–450)
RBC: 4.97 x10E6/uL (ref 4.14–5.80)
RDW: 12.4 % (ref 11.6–15.4)
WBC: 6.1 x10E3/uL (ref 3.4–10.8)

## 2024-02-20 LAB — HEPATITIS B SURFACE ANTIBODY, QUANTITATIVE: Hepatitis B Surf Ab Quant: <3.5 m[IU]/mL — ABNORMAL LOW

## 2024-02-20 LAB — URIC ACID: Uric Acid: 8.1 mg/dL (ref 3.8–8.4)

## 2024-02-20 MED ORDER — FEBUXOSTAT 80 MG PO TABS: 80.0000 mg | ORAL_TABLET | Freq: Every day | ORAL | 0 refills | Status: DC

## 2024-02-20 NOTE — Assessment & Plan Note (Signed)
 Conitnues to have issues with vision. Encouraged him to keep his appointment with neurology and to get his echocardiogram. Will pick up his statin. Call with any concerns. Recheck in about 2 weeks.

## 2024-02-20 NOTE — Assessment & Plan Note (Signed)
 Under good control on current regimen. Continue current regimen. Continue to monitor. Call with any concerns. Refills given.

## 2024-02-20 NOTE — Assessment & Plan Note (Signed)
 Had flare right around his stroke. Last labs were out of range. Will check labs and treat as needed.

## 2024-03-03 ENCOUNTER — Encounter: Payer: Self-pay | Admitting: Family Medicine

## 2024-03-03 ENCOUNTER — Ambulatory Visit (INDEPENDENT_AMBULATORY_CARE_PROVIDER_SITE_OTHER): Admitting: Family Medicine

## 2024-03-03 VITALS — BP 170/86 | HR 67 | Temp 97.8°F | Ht 69.0 in | Wt 173.0 lb

## 2024-03-03 DIAGNOSIS — E785 Hyperlipidemia, unspecified: Secondary | ICD-10-CM | POA: Diagnosis not present

## 2024-03-03 DIAGNOSIS — I10 Essential (primary) hypertension: Secondary | ICD-10-CM | POA: Diagnosis not present

## 2024-03-03 DIAGNOSIS — M1A09X0 Idiopathic chronic gout, multiple sites, without tophus (tophi): Secondary | ICD-10-CM | POA: Diagnosis not present

## 2024-03-03 MED ORDER — LOSARTAN POTASSIUM 50 MG PO TABS: 50.0000 mg | ORAL_TABLET | Freq: Every day | ORAL | 0 refills | Status: DC

## 2024-03-03 NOTE — Patient Instructions (Signed)
 [facility]- Neurology  [address], [place], [place] [address] Phone: [phone]

## 2024-03-03 NOTE — Assessment & Plan Note (Signed)
 Tolerating uloric  well. Rechecking labs today. Await results.

## 2024-03-03 NOTE — Assessment & Plan Note (Signed)
 Tolerating atorvastatin  well. Rechecking labs today. Await results.

## 2024-03-03 NOTE — Progress Notes (Signed)
 BP (!) 170/86   Pulse 67   Temp 97.8 F (36.6 C) (Oral)   Ht 5' 9 (1.753 m)   Wt 173 lb (78.5 kg)   SpO2 98%   BMI 25.55 kg/m    Subjective:    Patient ID: Sean FORBES Vita Mickey., male    DOB: 14-Feb-1966, 58 y.o.   MRN: [id number]  HPI: Sean Consuegra. is a 58 y.o. male  Chief Complaint  Patient presents with   Hypertension   Hyperlipidemia   HYPERTENSION / HYPERLIPIDEMIA Satisfied with current treatment? yes Duration of hypertension: chronic BP monitoring frequency: not checking BP medication side effects: no Past BP meds: losartan  Duration of hyperlipidemia: chronic Cholesterol medication side effects: no Cholesterol supplements: none Past cholesterol medications: atorvastatin  Medication compliance: good compliance Aspirin : yes Recent stressors: no Recurrent headaches: no Visual changes: no Palpitations: no Dyspnea: no Chest pain: no Lower extremity edema: no Dizzy/lightheaded: no  Tolerating her uloric  well. No concerns.   Relevant past medical, surgical, family and social history reviewed and updated as indicated. Interim medical history since our last visit reviewed. Allergies and medications reviewed and updated.  Review of Systems  Constitutional: Negative.   Respiratory: Negative.    Cardiovascular: Negative.   Musculoskeletal: Negative.   Neurological: Negative.   Psychiatric/Behavioral: Negative.      Per HPI unless specifically indicated above     Objective:    BP (!) 170/86   Pulse 67   Temp 97.8 F (36.6 C) (Oral)   Ht 5' 9 (1.753 m)   Wt 173 lb (78.5 kg)   SpO2 98%   BMI 25.55 kg/m   Wt Readings from Last 3 Encounters:  03/03/24 173 lb (78.5 kg)  02/19/24 169 lb 8 oz (76.9 kg)  02/02/24 169 lb 9.6 oz (76.9 kg)    Physical Exam Vitals and nursing note reviewed.  Constitutional:      General: He is not in acute distress.    Appearance: Normal appearance. He is not ill-appearing, toxic-appearing or diaphoretic.  HENT:      Head: Normocephalic and atraumatic.     Right Ear: External ear normal.     Left Ear: External ear normal.     Nose: Nose normal.     Mouth/Throat:     Mouth: Mucous membranes are moist.     Pharynx: Oropharynx is clear.  Eyes:     General: No scleral icterus.       Right eye: No discharge.        Left eye: No discharge.     Extraocular Movements: Extraocular movements intact.     Conjunctiva/sclera: Conjunctivae normal.     Pupils: Pupils are equal, round, and reactive to light.  Cardiovascular:     Rate and Rhythm: Normal rate and regular rhythm.     Pulses: Normal pulses.     Heart sounds: Normal heart sounds. No murmur heard.    No friction rub. No gallop.  Pulmonary:     Effort: Pulmonary effort is normal. No respiratory distress.     Breath sounds: Normal breath sounds. No stridor. No wheezing, rhonchi or rales.  Chest:     Chest wall: No tenderness.  Musculoskeletal:        General: Normal range of motion.     Cervical back: Normal range of motion and neck supple.  Skin:    General: Skin is warm and dry.     Capillary Refill: Capillary refill takes less than 2 seconds.  Coloration: Skin is not jaundiced or pale.     Findings: No bruising, erythema, lesion or rash.  Neurological:     General: No focal deficit present.     Mental Status: He is alert and oriented to person, place, and time. Mental status is at baseline.  Psychiatric:        Mood and Affect: Mood normal.        Behavior: Behavior normal.        Thought Content: Thought content normal.        Judgment: Judgment normal.     Results for orders placed or performed in visit on 02/19/24  Uric acid   Collection Time: 02/19/24  2:26 PM  Result Value Ref Range   Uric Acid 8.1 3.8 - 8.4 mg/dL  Comprehensive metabolic panel with GFR   Collection Time: 02/19/24  2:26 PM  Result Value Ref Range   Glucose 80 70 - 99 mg/dL   BUN 15 6 - 24 mg/dL   Creatinine, Ser 8.64 (H) 0.76 - 1.27 mg/dL   eGFR 61  >40 fO/fpw/8.26   BUN/Creatinine Ratio 11 9 - 20   Sodium 142 134 - 144 mmol/L   Potassium 4.0 3.5 - 5.2 mmol/L   Chloride 104 96 - 106 mmol/L   CO2 22 20 - 29 mmol/L   Calcium  9.6 8.7 - 10.2 mg/dL   Total Protein 6.9 6.0 - 8.5 g/dL   Albumin 4.2 3.8 - 4.9 g/dL   Globulin, Total 2.7 1.5 - 4.5 g/dL   Bilirubin Total 0.5 0.0 - 1.2 mg/dL   Alkaline Phosphatase 96 47 - 123 IU/L   AST 26 0 - 40 IU/L   ALT 28 0 - 44 IU/L  CBC with Differential/Platelet   Collection Time: 02/19/24  2:26 PM  Result Value Ref Range   WBC 6.1 3.4 - 10.8 x10E3/uL   RBC 4.97 4.14 - 5.80 x10E6/uL   Hemoglobin 14.9 13.0 - 17.7 g/dL   Hematocrit 55.0 62.4 - 51.0 %   MCV 90 79 - 97 fL   MCH 30.0 26.6 - 33.0 pg   MCHC 33.2 31.5 - 35.7 g/dL   RDW 87.5 88.3 - 84.5 %   Platelets 252 150 - 450 x10E3/uL   Neutrophils 55 Not Estab. %   Lymphs 29 Not Estab. %   Monocytes 12 Not Estab. %   Eos 2 Not Estab. %   Basos 2 Not Estab. %   Neutrophils Absolute 3.5 1.4 - 7.0 x10E3/uL   Lymphocytes Absolute 1.8 0.7 - 3.1 x10E3/uL   Monocytes Absolute 0.7 0.1 - 0.9 x10E3/uL   EOS (ABSOLUTE) 0.1 0.0 - 0.4 x10E3/uL   Basophils Absolute 0.1 0.0 - 0.2 x10E3/uL   Immature Granulocytes 0 Not Estab. %   Immature Grans (Abs) 0.0 0.0 - 0.1 x10E3/uL  Hepatitis B surface antibody,quantitative   Collection Time: 02/19/24  2:26 PM  Result Value Ref Range   Hepatitis B Surf Ab Quant <3.5 (L) Immunity>10 mIU/mL      Assessment & Plan:   Problem List Items Addressed This Visit       Cardiovascular and Mediastinum   Hypertension   Not under good control. Will increase his losartan  to 50mg  and recheck in 2-3 weeks. Call with any concerns.       Relevant Medications   losartan  (COZAAR ) 50 MG tablet   Other Relevant Orders   CBC with Differential/Platelet   Comprehensive metabolic panel with GFR     Other   Hyperlipidemia -  Primary   Tolerating atorvastatin  well. Rechecking labs today. Await results.       Relevant  Medications   losartan  (COZAAR ) 50 MG tablet   Other Relevant Orders   CBC with Differential/Platelet   Comprehensive metabolic panel with GFR   Lipid Panel w/o Chol/HDL Ratio   Gout   Tolerating uloric  well. Rechecking labs today. Await results.       Relevant Orders   Uric acid   CBC with Differential/Platelet   Comprehensive metabolic panel with GFR     Follow up plan: Return 2-3 weeks.

## 2024-03-03 NOTE — Assessment & Plan Note (Signed)
 Not under good control. Will increase his losartan  to 50mg  and recheck in 2-3 weeks. Call with any concerns.

## 2024-03-04 ENCOUNTER — Ambulatory Visit: Payer: Self-pay | Admitting: Family Medicine

## 2024-03-04 ENCOUNTER — Other Ambulatory Visit: Payer: Self-pay | Admitting: Family Medicine

## 2024-03-04 DIAGNOSIS — M1A09X0 Idiopathic chronic gout, multiple sites, without tophus (tophi): Secondary | ICD-10-CM

## 2024-03-04 LAB — LIPID PANEL W/O CHOL/HDL RATIO
Cholesterol, Total: 214 mg/dL — ABNORMAL HIGH (ref 100–199)
HDL: 48 mg/dL (ref >39)
LDL Chol Calc (NIH): 137 mg/dL — ABNORMAL HIGH (ref 0–99)
Triglycerides: 164 mg/dL — ABNORMAL HIGH (ref 0–149)
VLDL Cholesterol Cal: 29 mg/dL (ref 5–40)

## 2024-03-04 LAB — COMPREHENSIVE METABOLIC PANEL WITH GFR
ALT: 25 IU/L (ref 0–44)
AST: 19 IU/L (ref 0–40)
Albumin: 4 g/dL (ref 3.8–4.9)
Alkaline Phosphatase: 90 IU/L (ref 47–123)
BUN/Creatinine Ratio: 13 (ref 9–20)
BUN: 18 mg/dL (ref 6–24)
Bilirubin Total: 0.3 mg/dL (ref 0.0–1.2)
CO2: 25 mmol/L (ref 20–29)
Calcium: 9.1 mg/dL (ref 8.7–10.2)
Chloride: 103 mmol/L (ref 96–106)
Creatinine, Ser: 1.42 mg/dL — ABNORMAL HIGH (ref 0.76–1.27)
Globulin, Total: 2.8 g/dL (ref 1.5–4.5)
Glucose: 95 mg/dL (ref 70–99)
Potassium: 3.8 mmol/L (ref 3.5–5.2)
Sodium: 141 mmol/L (ref 134–144)
Total Protein: 6.8 g/dL (ref 6.0–8.5)
eGFR: 58 mL/min/1.73 — ABNORMAL LOW (ref >59)

## 2024-03-04 LAB — CBC WITH DIFFERENTIAL/PLATELET
Basophils Absolute: 0.1 x10E3/uL (ref 0.0–0.2)
Basos: 0 %
EOS (ABSOLUTE): 0 x10E3/uL (ref 0.0–0.4)
Eos: 0 %
Hematocrit: 45.6 % (ref 37.5–51.0)
Hemoglobin: 15 g/dL (ref 13.0–17.7)
Immature Grans (Abs): 0.2 x10E3/uL — ABNORMAL HIGH (ref 0.0–0.1)
Immature Granulocytes: 1 %
Lymphocytes Absolute: 2.1 x10E3/uL (ref 0.7–3.1)
Lymphs: 14 %
MCH: 29.9 pg (ref 26.6–33.0)
MCHC: 32.9 g/dL (ref 31.5–35.7)
MCV: 91 fL (ref 79–97)
Monocytes Absolute: 1.2 x10E3/uL — ABNORMAL HIGH (ref 0.1–0.9)
Monocytes: 8 %
Neutrophils Absolute: 11.4 x10E3/uL — ABNORMAL HIGH (ref 1.4–7.0)
Neutrophils: 77 %
Platelets: 277 x10E3/uL (ref 150–450)
RBC: 5.02 x10E6/uL (ref 4.14–5.80)
RDW: 12.7 % (ref 11.6–15.4)
WBC: 15 x10E3/uL — ABNORMAL HIGH (ref 3.4–10.8)

## 2024-03-04 LAB — URIC ACID: Uric Acid: 4 mg/dL (ref 3.8–8.4)

## 2024-03-05 NOTE — Telephone Encounter (Signed)
 Requested Prescriptions  Pending Prescriptions Disp Refills   colchicine  0.6 MG tablet [Pharmacy Med Name: colchicine  0.6 mg tablet (COLCRYS )] 90 tablet 0    Sig: Take 1 tablet (0.6 mg total) by mouth daily as needed.     Endocrinology:  Gout Agents - colchicine  Failed - 03/05/2024 10:59 PM      Failed - Cr in normal range and within 360 days    Creatinine, Ser  Date Value Ref Range Status  03/03/2024 1.42 (H) 0.76 - 1.27 mg/dL Final         Failed - CBC within normal limits and completed in the last 12 months    WBC  Date Value Ref Range Status  03/03/2024 15.0 (H) 3.4 - 10.8 x10E3/uL Final  11/09/2020 7.5 4.0 - 10.5 K/uL Final   RBC  Date Value Ref Range Status  03/03/2024 5.02 4.14 - 5.80 x10E6/uL Final  11/09/2020 5.09 4.22 - 5.81 MIL/uL Final   Hemoglobin  Date Value Ref Range Status  03/03/2024 15.0 13.0 - 17.7 g/dL Final   Hematocrit  Date Value Ref Range Status  03/03/2024 45.6 37.5 - 51.0 % Final   MCHC  Date Value Ref Range Status  03/03/2024 32.9 31.5 - 35.7 g/dL Final  93/69/7977 65.4 30.0 - 36.0 g/dL Final   [facility]  Date Value Ref Range Status  03/03/2024 29.9 26.6 - 33.0 pg Final  11/09/2020 29.5 26.0 - 34.0 pg Final   MCV  Date Value Ref Range Status  03/03/2024 91 79 - 97 fL Final   No results found for: PLTCOUNTKUC, LABPLAT, POCPLA RDW  Date Value Ref Range Status  03/03/2024 12.7 11.6 - 15.4 % Final         Passed - ALT in normal range and within 360 days    ALT  Date Value Ref Range Status  03/03/2024 25 0 - 44 IU/L Final         Passed - AST in normal range and within 360 days    AST  Date Value Ref Range Status  03/03/2024 19 0 - 40 IU/L Final         Passed - Valid encounter within last 12 months    Recent Outpatient Visits           2 days ago Hyperlipidemia, unspecified hyperlipidemia type   [place] [facility] [place], Megan P, DO   2 weeks ago History of CVA with residual deficit   [place]  [facility] [place], Megan P, DO   1 month ago Visual changes   [place] [facility] [place], Megan P, DO   4 months ago Routine general medical examination at a health care facility   [facility] [place], [place], DO

## 2024-03-09 ENCOUNTER — Ambulatory Visit: Payer: Self-pay

## 2024-03-09 NOTE — Telephone Encounter (Signed)
 FYI Only or Action Required?: FYI only for provider.  Patient was last seen in primary care on 03/03/2024 by Vicci Bouchard P, DO.  Called Nurse Triage reporting Sore Throat.  Symptoms began a week ago.  Symptoms are: gradually improving.  Triage Disposition: See PCP When Office is Open (Within 3 Days)  Patient/caregiver understands and will follow disposition?: Yes     Copied from CRM #[id number]. Topic: Appointments - Appointment Scheduling >> Mar 09, 2024  1:30 PM Victoria B wrote: Patient called in with runny nose , sore throat and voice hoarse, no appt until December. Patient want to know if he can take something over the counter      Reason for Disposition  [1] Sore throat with cough/cold symptoms AND [2] present > 5 days  Answer Assessment - Initial Assessment Questions 1. ONSET: When did the throat start hurting? (Hours or days ago)      About a week ago  2. SEVERITY: How bad is the sore throat? (Scale 1-10; mild, moderate or severe)     Moderate  3. STREP EXPOSURE: Has there been any exposure to strep within the past week? If Yes, ask: What type of contact occurred?      No 4.  VIRAL SYMPTOMS: Are there any symptoms of a cold, such as a runny nose, cough, hoarse voice or red eyes?      Cough with green sputum, runny nose  5. FEVER: Do you have a fever? If Yes, ask: What is your temperature, how was it measured, and when did it start?     No 6. PUS ON THE TONSILS: Is there pus on the tonsils in the back of your throat?     No 7. OTHER SYMPTOMS: Do you have any other symptoms? (e.g., difficulty breathing, headache, rash)     Hoarse voice, headache  Protocols used: Sore Throat-A-AH

## 2024-03-10 ENCOUNTER — Encounter: Payer: Self-pay | Admitting: Pediatrics

## 2024-03-10 ENCOUNTER — Ambulatory Visit (INDEPENDENT_AMBULATORY_CARE_PROVIDER_SITE_OTHER): Admitting: Pediatrics

## 2024-03-10 VITALS — BP 139/78 | HR 66 | Temp 98.9°F | Resp 15 | Ht 69.02 in | Wt 170.6 lb

## 2024-03-10 DIAGNOSIS — J040 Acute laryngitis: Secondary | ICD-10-CM

## 2024-03-10 DIAGNOSIS — R030 Elevated blood-pressure reading, without diagnosis of hypertension: Secondary | ICD-10-CM | POA: Diagnosis not present

## 2024-03-10 DIAGNOSIS — J069 Acute upper respiratory infection, unspecified: Secondary | ICD-10-CM | POA: Diagnosis not present

## 2024-03-10 MED ORDER — METHYLPREDNISOLONE 4 MG PO TBPK: ORAL_TABLET | ORAL | 0 refills | Status: DC

## 2024-03-10 NOTE — Patient Instructions (Signed)
 Most cold symptoms last up to 2 weeks, but cough can sometimes linger up to 4 weeks.  However if your symtpoms get WORSE - like you develop fevers or get more shortness of breath, then call your clinic as you may need to be evaluated.   Aches and Pains Acetaminophen (Tylenol): 1000mg  ("extra strength" tablets are 500mg , so take 2) every 8 hours if needed  Ibuprofen (Advil/Motrin) 400-800mg  (comes in 200mg  pills OTC, so 2-4 pills) every 8 hours. - Avoid in excess if cardiac blood pressure issues  Sore Throat:  See Aches and Pains meds above, also Sore throat sprays and lozenges may also help.   Cough:  Honey 2 TBS every 4-6 hours if needed.  Robitussin DM syrup or generic equivalent which has (guaifenesin = an expectorant to help you get stuff up + dextromethorphan (DM) = cough supressant). You can also get this in tablet formula (like Mucinex DM or generic equivalent).  If you have asthma or are wheezing and have a tight chest, then albuterol inhaler (Ventolin, ProAir) may be helpful - you need a prescription for this.   Congestion:  oxymetazoline (Afrin) nasal stray: 2 sprays each nostril every 12 hours. Don't use more than 3 days in a row to avoid building a tolerance to it.  Sinus rinse (neti pot) high volume sinus rinse can help open up your sinuses and be helpful, especially if you're having sinus pressure and headaches.   Other:  Umcka (pelargonium sidoides extract) can to shorten cold symptoms (can be hard to find, but [employer] carries it: brand name Umcka ColdCare from [employer]). Works best if you start taking at earliest signs of cold symptoms.  Andrographis paniculata is another herbal remedy with less evidence, but may reduce common cold symptoms in adults.  zinc acetate lozenges >= 80 mg/day reduces duration but not severity of cold symptoms in adults, but it is associated with bad taste and nausea Heated humidified air may reduce cold symptoms, so try using a humidifier -  especially in your bedroom at night.  Stay hydrated! Aim to drink at least 2 liters of water daily.   What doesn't work (but lots of folks think might) Vitamin C: bummer right?! But there's no evidence that high dose vitamin C will help cold symptoms. Antibiotics: colds come from viruses - which antibiotics don't kill . . . But they will kill your health/natural bacteria in your body and can build resistant 'super bugs'

## 2024-03-10 NOTE — Progress Notes (Signed)
 Office Visit  BP 139/78 (BP Location: Left Arm, Patient Position: Sitting, Cuff Size: Normal)   Pulse 66   Temp 98.9 F (37.2 C) (Oral)   Resp 15   Ht 5' 9.02 (1.753 m)   Wt 170 lb 9.6 oz (77.4 kg)   SpO2 98%   BMI 25.18 kg/m    Subjective:    Patient ID: Sean FORBES Vita Mickey., male    DOB: 26-Sep-1965, 58 y.o.   MRN: [id number]  HPI: Sean Vandyne. is a 58 y.o. male  Chief Complaint  Patient presents with   URI    Started last Wednesday. Chest congestion, horse voice, tired. Still working without issue. Minor sore throat, phelgm, coughing, sneezing, runny nose in the am, ear pressure, no fevers.     Discussed the use of AI scribe software for clinical note transcription with the patient, who gave verbal consent to proceed.  History of Present Illness   Sean Hamblin. is a 58 year old male who presents with congestion and voice changes for one week.  He has been experiencing congestion for the past week, primarily affecting his upper respiratory tract and causing changes in his voice. Despite these symptoms, he has been able to continue working and performing daily activities. No symptoms affecting the lower respiratory tract.  He has been using over-the-counter Mucinex, which has provided some relief, but the symptoms persist. Additionally, he has tried hot cider, which temporarily improved his voice.     Relevant past medical, surgical, family and social history reviewed and updated as indicated. Interim medical history since our last visit reviewed. Allergies and medications reviewed and updated.  ROS per HPI unless specifically indicated above     Objective:    BP 139/78 (BP Location: Left Arm, Patient Position: Sitting, Cuff Size: Normal)   Pulse 66   Temp 98.9 F (37.2 C) (Oral)   Resp 15   Ht 5' 9.02 (1.753 m)   Wt 170 lb 9.6 oz (77.4 kg)   SpO2 98%   BMI 25.18 kg/m   Wt Readings from Last 3 Encounters:  03/10/24 170 lb 9.6 oz (77.4 kg)   03/03/24 173 lb (78.5 kg)  02/19/24 169 lb 8 oz (76.9 kg)     Physical Exam Constitutional:      Appearance: Normal appearance.     Comments: +Voice hoarseness  HENT:     Nose: Congestion present.     Mouth/Throat:     Pharynx: Posterior oropharyngeal erythema present.  Cardiovascular:     Rate and Rhythm: Normal rate and regular rhythm.     Pulses: Normal pulses.     Heart sounds: Normal heart sounds.  Pulmonary:     Effort: Pulmonary effort is normal.     Breath sounds: Normal breath sounds.  Musculoskeletal:        General: Normal range of motion.  Skin:    Comments: Normal skin color  Neurological:     General: No focal deficit present.     Mental Status: He is alert. Mental status is at baseline.  Psychiatric:        Mood and Affect: Mood normal.        Behavior: Behavior normal.        Thought Content: Thought content normal.         03/10/2024   10:58 AM 02/19/2024    2:07 PM 10/28/2023    4:03 PM 03/31/2023   10:53 AM 02/25/2023   11:19 AM  Depression screen PHQ 2/9  Decreased Interest 0 0 0 0 0  Down, Depressed, Hopeless 0 0 0 0 0  PHQ - 2 Score 0 0 0 0 0  Altered sleeping 0 0 0 0 0  Tired, decreased energy 0 0 0 0 0  Change in appetite 0 0 0 0 0  Feeling bad or failure about yourself  0 0 0 0 0  Trouble concentrating 0 0 0 0 3  Moving slowly or fidgety/restless 0 0 0 0 0  Suicidal thoughts 0 0 0 0 0  PHQ-9 Score 0 0 0 0 3  Difficult doing work/chores    Not difficult at all Not difficult at all       03/10/2024   10:59 AM 02/19/2024    2:07 PM 10/28/2023    4:03 PM 02/25/2023   11:19 AM  GAD 7 : Generalized Anxiety Score  Nervous, Anxious, on Edge 0 0 0 0  Control/stop worrying 0 0 0 0  Worry too much - different things 0 0 0 0  Trouble relaxing 0 0 0 0  Restless 0 0 0   Easily annoyed or irritable 0 0 0   Afraid - awful might happen 0 0 0 0  Total GAD 7 Score 0 0 0   Anxiety Difficulty    Not difficult at all       Assessment & Plan:   Assessment & Plan   Laryngitis Likely viral infection with upper respiratory symptoms, minimal relief from OTC medications. Negative strep, flu and covid. Exam reassuring - Prescribed Medrol  for ongoing laryngitis and voice hoarseness - Advised Mucinex DM and Robitussin with DM. - Recommended honey and warm cider for symptom relief. - Instructed to call if no improvement after five days of steroids.  -     POC Covid19/Flu A&B Antigen -     POCT rapid strep A -     methylPREDNISolone ; Please follow packaging instructions  Dispense: 21 each; Refill: 0   Follow up plan: Return if symptoms worsen or fail to improve.  Sean SHAUNNA Nett, MD

## 2024-03-15 LAB — POC COVID19/FLU A&B COMBO
Covid Antigen, POC: NEGATIVE
Influenza A Antigen, POC: NEGATIVE
Influenza B Antigen, POC: NEGATIVE

## 2024-03-15 LAB — POCT RAPID STREP A (OFFICE): Rapid Strep A Screen: NEGATIVE

## 2024-03-16 ENCOUNTER — Ambulatory Visit: Payer: Self-pay | Admitting: Pediatrics

## 2024-03-22 ENCOUNTER — Ambulatory Visit: Admitting: Family Medicine

## 2024-04-16 ENCOUNTER — Ambulatory Visit: Payer: Self-pay | Admitting: Family Medicine

## 2024-04-16 ENCOUNTER — Ambulatory Visit: Attending: Family Medicine

## 2024-04-16 DIAGNOSIS — I63439 Cerebral infarction due to embolism of unspecified posterior cerebral artery: Secondary | ICD-10-CM

## 2024-04-16 DIAGNOSIS — Q248 Other specified congenital malformations of heart: Secondary | ICD-10-CM | POA: Insufficient documentation

## 2024-04-16 LAB — ECHOCARDIOGRAM COMPLETE BUBBLE STUDY
AR max vel: 2.48 cm2
AV Area VTI: 2.32 cm2
AV Area mean vel: 2.15 cm2
AV Mean grad: 5 mmHg
AV Peak grad: 9 mmHg
Ao pk vel: 1.5 m/s
Area-P 1/2: 3.21 cm2
S' Lateral: 2.3 cm

## 2024-04-28 ENCOUNTER — Ambulatory Visit

## 2024-04-28 ENCOUNTER — Ambulatory Visit: Payer: Self-pay

## 2024-04-28 NOTE — Telephone Encounter (Signed)
 FYI Only or Action Required?: FYI only for provider: appointment scheduled on 04/29/2024.  Patient was last seen in primary care on 03/10/2024 by Herold Hadassah SQUIBB, MD.  Called Nurse Triage reporting Otalgia.  Symptoms began several days ago.  Triage Disposition: See Physician Within 24 Hours  Patient/caregiver understands and will follow disposition?: Yes            Copied from CRM #[id number]. Topic: Clinical - Red Word Triage >> Apr 28, 2024  1:21 PM Donna BRAVO wrote: Red Word that prompted transfer to Nurse Triage:  Severe right ear pain  wanting to reschedule appt for 04/28/24 >> Apr 28, 2024  1:31 PM Donna E wrote: Did not cancel appt waiting for Nurse triage instructions  Patient wanting call back Reason for Disposition  Decreased hearing (or another adult says that the ear canal is completely blocked with discharge)  Answer Assessment - Initial Assessment Questions Pt called at 1:20 today to cancel his appt at 3 PM. This RN was unable to cancel it once this RN called pt back as the appt time had passed. This RN was able to schedule pt an appt tmrw at a different office within preferred group region. Pt aware of location. Pt told to call back with any new or worsening symptoms.   Right ear pain a few days Radiating down shoulder Intermittent popping 3-4/10 pain level Denies headache, fever, decreased hearing Pt wants to reschedule appt today as he can't leave work  Protocols used: Ear - [employer]

## 2024-04-29 ENCOUNTER — Ambulatory Visit: Admitting: Family Medicine

## 2024-04-29 ENCOUNTER — Encounter: Payer: Self-pay | Admitting: Family Medicine

## 2024-04-29 VITALS — BP 148/82 | HR 70 | Temp 97.9°F | Ht 69.0 in | Wt 174.6 lb

## 2024-04-29 DIAGNOSIS — R632 Polyphagia: Secondary | ICD-10-CM

## 2024-04-29 DIAGNOSIS — M1A09X0 Idiopathic chronic gout, multiple sites, without tophus (tophi): Secondary | ICD-10-CM

## 2024-04-29 DIAGNOSIS — E785 Hyperlipidemia, unspecified: Secondary | ICD-10-CM | POA: Diagnosis not present

## 2024-04-29 DIAGNOSIS — H6501 Acute serous otitis media, right ear: Secondary | ICD-10-CM

## 2024-04-29 DIAGNOSIS — I10 Essential (primary) hypertension: Secondary | ICD-10-CM

## 2024-04-29 LAB — BAYER DCA HB A1C WAIVED: HB A1C (BAYER DCA - WAIVED): 5.9 % — ABNORMAL HIGH (ref 4.8–5.6)

## 2024-04-29 MED ORDER — AMOXICILLIN-POT CLAVULANATE 875-125 MG PO TABS: 1.0000 | ORAL_TABLET | Freq: Two times a day (BID) | ORAL | 0 refills | Status: DC

## 2024-04-29 MED ORDER — ASPIRIN 81 MG PO TBEC: 81.0000 mg | DELAYED_RELEASE_TABLET | Freq: Every day | ORAL | 0 refills | Status: AC

## 2024-04-29 MED ORDER — LOSARTAN POTASSIUM 100 MG PO TABS: 100.0000 mg | ORAL_TABLET | Freq: Every day | ORAL | 0 refills | Status: DC

## 2024-04-29 MED ORDER — ATORVASTATIN CALCIUM 40 MG PO TABS: 40.0000 mg | ORAL_TABLET | Freq: Every day | ORAL | 0 refills | Status: AC

## 2024-04-29 NOTE — Progress Notes (Signed)
 BP (!) 148/82   Pulse 70   Temp 97.9 F (36.6 C) (Oral)   Ht 5' 9 (1.753 m)   Wt 174 lb 9.6 oz (79.2 kg)   SpO2 97%   BMI 25.78 kg/m    Subjective:    Patient ID: Sean Delacruz., male    DOB: 1965-12-09, 58 y.o.   MRN: [id number]  HPI: Sean Delacruz. is a 58 y.o. male  Chief Complaint  Patient presents with   Hypertension   Hyperlipidemia   Ear Pain    Right ear. Onset yesterday. Feels clogged. Dull achy pain. Radiates down neck. Does come and goes. Started at work. No known cause   HYPERTENSION / HYPERLIPIDEMIA Satisfied with current treatment? yes Duration of hypertension: chronic BP monitoring frequency: not checking BP medication side effects: no Past BP meds: losartan  Duration of hyperlipidemia: chronic Cholesterol medication side effects: no Cholesterol supplements: none Past cholesterol medications: atorvastatin  Medication compliance: good compliance Aspirin : yes Recent stressors: no Recurrent headaches: no Visual changes: no Palpitations: no Dyspnea: no Chest pain: no Lower extremity edema: no Dizzy/lightheaded: no  No gout flares. Tolerating his medicine well. No concerns.   EAR PAIN Duration: 2 days Involved ear(s): right Severity:  severe  Quality:  aching and sore Fever: no Otorrhea: no Upper respiratory infection symptoms: no Pruritus: no Hearing loss: yes Water  immersion no Using Q-tips: no Recurrent otitis media: no Status: worse Treatments attempted: none  Relevant past medical, surgical, family and social history reviewed and updated as indicated. Interim medical history since our last visit reviewed. Allergies and medications reviewed and updated.  Review of Systems  Constitutional: Negative.   HENT:  Positive for ear pain. Negative for congestion, dental problem, drooling, ear discharge, facial swelling, hearing loss, mouth sores, nosebleeds, postnasal drip, rhinorrhea, sinus pressure, sinus pain, sneezing, sore  throat, tinnitus, trouble swallowing and voice change.   Respiratory: Negative.    Cardiovascular: Negative.   Musculoskeletal: Negative.   Neurological: Negative.   Psychiatric/Behavioral: Negative.      Per HPI unless specifically indicated above     Objective:    BP (!) 148/82   Pulse 70   Temp 97.9 F (36.6 C) (Oral)   Ht 5' 9 (1.753 m)   Wt 174 lb 9.6 oz (79.2 kg)   SpO2 97%   BMI 25.78 kg/m   Wt Readings from Last 3 Encounters:  04/29/24 174 lb 9.6 oz (79.2 kg)  03/10/24 170 lb 9.6 oz (77.4 kg)  03/03/24 173 lb (78.5 kg)    Physical Exam Vitals and nursing note reviewed.  Constitutional:      General: He is not in acute distress.    Appearance: Normal appearance. He is not ill-appearing, toxic-appearing or diaphoretic.  HENT:     Head: Normocephalic and atraumatic.     Right Ear: External ear normal. Tympanic membrane is erythematous and bulging.     Left Ear: Tympanic membrane, ear canal and external ear normal.     Nose: Nose normal.     Mouth/Throat:     Mouth: Mucous membranes are moist.     Pharynx: Oropharynx is clear.  Eyes:     General: No scleral icterus.       Right eye: No discharge.        Left eye: No discharge.     Extraocular Movements: Extraocular movements intact.     Conjunctiva/sclera: Conjunctivae normal.     Pupils: Pupils are equal, round, and reactive to light.  Cardiovascular:     Rate and Rhythm: Normal rate and regular rhythm.     Pulses: Normal pulses.     Heart sounds: Normal heart sounds. No murmur heard.    No friction rub. No gallop.  Pulmonary:     Effort: Pulmonary effort is normal. No respiratory distress.     Breath sounds: Normal breath sounds. No stridor. No wheezing, rhonchi or rales.  Chest:     Chest wall: No tenderness.  Musculoskeletal:        General: Normal range of motion.     Cervical back: Normal range of motion and neck supple.  Skin:    General: Skin is warm and dry.     Capillary Refill: Capillary  refill takes less than 2 seconds.     Coloration: Skin is not jaundiced or pale.     Findings: No bruising, erythema, lesion or rash.  Neurological:     General: No focal deficit present.     Mental Status: He is alert and oriented to person, place, and time. Mental status is at baseline.  Psychiatric:        Mood and Affect: Mood normal.        Behavior: Behavior normal.        Thought Content: Thought content normal.        Judgment: Judgment normal.     Results for orders placed or performed in visit on 04/16/24  ECHOCARDIOGRAM COMPLETE BUBBLE STUDY   Collection Time: 04/16/24  9:24 AM  Result Value Ref Range   AR max vel 2.48 cm2   AV Peak grad 9.0 mmHg   Ao pk vel 1.50 m/s   S' Lateral 2.30 cm   Area-P 1/2 3.21 cm2   AV Area VTI 2.32 cm2   AV Mean grad 5.0 mmHg   AV Area mean vel 2.15 cm2   Est EF 60 - 65%       Assessment & Plan:   Problem List Items Addressed This Visit       Cardiovascular and Mediastinum   Hypertension   Better on recheck, but still running high. Will increase his losartan  to 100mg . Due to see cardiology in 2 weeks. Follow up here in 4 weeks. Call with any concerns.       Relevant Medications   losartan  (COZAAR ) 100 MG tablet   aspirin  EC 81 MG tablet   atorvastatin  (LIPITOR) 40 MG tablet   Other Relevant Orders   CBC with Differential/Platelet   Comprehensive metabolic panel with GFR     Other   Hyperlipidemia - Primary   Rechecking labs today. Await results. Treat as needed.       Relevant Medications   losartan  (COZAAR ) 100 MG tablet   aspirin  EC 81 MG tablet   atorvastatin  (LIPITOR) 40 MG tablet   Other Relevant Orders   CBC with Differential/Platelet   Lipid Panel w/o Chol/HDL Ratio   Comprehensive metabolic panel with GFR   Gout   Rechecking labs today. Await results. Treat as needed.       Relevant Orders   CBC with Differential/Platelet   Uric acid   Comprehensive metabolic panel with GFR   Other Visit Diagnoses        Non-recurrent acute serous otitis media of right ear       Will treat with agumentin. Call if not getting better or getting worse.   Relevant Medications   amoxicillin -clavulanate (AUGMENTIN ) 875-125 MG tablet     Polyphagia  Will check labs. Await results.   Relevant Orders   Bayer DCA Hb A1c Waived        Follow up plan: Return in about 4 weeks (around 05/27/2024).

## 2024-04-29 NOTE — Assessment & Plan Note (Signed)
 Rechecking labs today. Await results. Treat as needed.

## 2024-04-29 NOTE — Assessment & Plan Note (Signed)
 Better on recheck, but still running high. Will increase his losartan  to 100mg . Due to see cardiology in 2 weeks. Follow up here in 4 weeks. Call with any concerns.

## 2024-04-30 ENCOUNTER — Ambulatory Visit: Payer: Self-pay | Admitting: Family Medicine

## 2024-04-30 LAB — CBC WITH DIFFERENTIAL/PLATELET
Basophils Absolute: 0.1 x10E3/uL (ref 0.0–0.2)
Basos: 2 %
EOS (ABSOLUTE): 0.1 x10E3/uL (ref 0.0–0.4)
Eos: 2 %
Hematocrit: 42.3 % (ref 37.5–51.0)
Hemoglobin: 14.4 g/dL (ref 13.0–17.7)
Immature Grans (Abs): 0 x10E3/uL (ref 0.0–0.1)
Immature Granulocytes: 0 %
Lymphocytes Absolute: 1.8 x10E3/uL (ref 0.7–3.1)
Lymphs: 31 %
MCH: 30 pg (ref 26.6–33.0)
MCHC: 34 g/dL (ref 31.5–35.7)
MCV: 88 fL (ref 79–97)
Monocytes Absolute: 0.6 x10E3/uL (ref 0.1–0.9)
Monocytes: 10 %
Neutrophils Absolute: 3.2 x10E3/uL (ref 1.4–7.0)
Neutrophils: 55 %
Platelets: 266 x10E3/uL (ref 150–450)
RBC: 4.8 x10E6/uL (ref 4.14–5.80)
RDW: 12.8 % (ref 11.6–15.4)
WBC: 5.8 x10E3/uL (ref 3.4–10.8)

## 2024-04-30 LAB — LIPID PANEL W/O CHOL/HDL RATIO
Cholesterol, Total: 210 mg/dL — ABNORMAL HIGH (ref 100–199)
HDL: 37 mg/dL — ABNORMAL LOW
LDL Chol Calc (NIH): 131 mg/dL — ABNORMAL HIGH (ref 0–99)
Triglycerides: 232 mg/dL — ABNORMAL HIGH (ref 0–149)
VLDL Cholesterol Cal: 42 mg/dL — ABNORMAL HIGH (ref 5–40)

## 2024-04-30 LAB — COMPREHENSIVE METABOLIC PANEL WITH GFR
ALT: 34 IU/L (ref 0–44)
AST: 28 IU/L (ref 0–40)
Albumin: 4.1 g/dL (ref 3.8–4.9)
Alkaline Phosphatase: 92 IU/L (ref 47–123)
BUN/Creatinine Ratio: 9 (ref 9–20)
BUN: 13 mg/dL (ref 6–24)
Bilirubin Total: 0.3 mg/dL (ref 0.0–1.2)
CO2: 23 mmol/L (ref 20–29)
Calcium: 9.5 mg/dL (ref 8.7–10.2)
Chloride: 104 mmol/L (ref 96–106)
Creatinine, Ser: 1.43 mg/dL — ABNORMAL HIGH (ref 0.76–1.27)
Globulin, Total: 2.8 g/dL (ref 1.5–4.5)
Glucose: 86 mg/dL (ref 70–99)
Potassium: 3.9 mmol/L (ref 3.5–5.2)
Sodium: 140 mmol/L (ref 134–144)
Total Protein: 6.9 g/dL (ref 6.0–8.5)
eGFR: 57 mL/min/1.73 — ABNORMAL LOW

## 2024-04-30 LAB — URIC ACID: Uric Acid: 6.9 mg/dL (ref 3.8–8.4)

## 2024-05-13 NOTE — Progress Notes (Unsigned)
 Cardiology Office Note  Date:  05/14/2024   ID:  Sean Delacruz., DOB 06-May-1966, MRN [id number]  PCP:  Vicci Duwaine SQUIBB, DO   Chief Complaint  Patient presents with   New Patient (Initial Visit)    Interatrial Cardiac shunt no complaints today. Meds reviewed verbally with pt.    HPI:  Sean Delacruz. is a 59 y.o. male with past medical history of: Past Medical History:  Diagnosis Date   Allergic rhinitis    Dysphagia    GERD (gastroesophageal reflux disease)    Gout    History of cellulitis 11/2013 and 8/15   Hyperlipidemia    Hypertension    TIA (transient ischemic attack) 2022   No deficits  Who presents by referral from Dr. 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  Echocardiogram December 2025 Normal LV function Positive bubble study reported Images of saline contrast injection reviewed by myself and by technician that performed the study, appears to be a negative bubble study  EKG personally reviewed by myself on todays visit EKG Interpretation Date/Time:  Friday May 14 2024 14:45:12 EST Ventricular Rate:  62 PR Interval:  152 QRS Duration:  102 QT Interval:  414 QTC Calculation: 420 R Axis:   12  Text Interpretation: Normal sinus rhythm Minimal voltage criteria for LVH, may be normal variant ( Cornell product ) When compared with ECG of 09-Nov-2020 22:13, No significant change was found  Confirmed by Perla Lye [phone]) on 05/14/2024 2:49:52 PM    PMH:   has a past medical history of Allergic rhinitis, Dysphagia, GERD (gastroesophageal reflux disease), Gout, History of cellulitis (11/2013 and 8/15), Hyperlipidemia, Hypertension, and TIA (transient ischemic attack) (2022).   PSH:    Past Surgical History:  Procedure Laterality Date   COLONOSCOPY WITH PROPOFOL  N/A 11/22/2022   Procedure: COLONOSCOPY WITH PROPOFOL ;  Surgeon: Jinny Carmine, MD;  Location: [facility];  Service: Endoscopy;  Laterality: N/A;   ESOPHAGEAL DILATION  11/22/2022   Procedure: ESOPHAGEAL DILATION;  Surgeon: Jinny Carmine, MD;  Location: [facility];  Service: Endoscopy;;   ESOPHAGOGASTRODUODENOSCOPY (EGD) WITH PROPOFOL  N/A 11/22/2022   Procedure: ESOPHAGOGASTRODUODENOSCOPY (EGD) WITH PROPOFOL ;  Surgeon: Jinny Carmine, MD;  Location: [facility];  Service: Endoscopy;  Laterality: N/A;   HIATAL HERNIA REPAIR     x3   KNEE ARTHROPLASTY     KNEE SURGERY     POLYPECTOMY  11/22/2022   Procedure: POLYPECTOMY;  Surgeon: Jinny Carmine, MD;  Location: [facility];  Service: Endoscopy;;    Current Outpatient Medications  Medication Sig Dispense Refill   amoxicillin -clavulanate (AUGMENTIN ) 875-125 MG tablet Take 1 tablet by mouth 2 (two) times daily. 20 tablet 0   aspirin  EC 81 MG tablet Take 1 tablet (81 mg total) by mouth daily. Swallow whole. 90 tablet 0   atorvastatin  (LIPITOR) 40 MG tablet Take 1 tablet (40 mg total) by mouth daily. 90 tablet 0   esomeprazole  (NEXIUM ) 40 MG capsule  Take 1 capsule (40 mg total) by mouth daily at 12 noon. 90 capsule 3   losartan  (COZAAR ) 100 MG tablet Take 1 tablet (100 mg total) by mouth daily. 90 tablet 0   Febuxostat  80 MG TABS Take 1 tablet (80 mg total) by mouth daily. (Patient not taking: Reported on 05/14/2024) 90 tablet 0   fluticasone  (FLONASE ) 50 MCG/ACT nasal spray Place 2 sprays into both nostrils daily. (Patient not taking: Reported on  05/14/2024) 16 g 6   No current facility-administered medications for this visit.     Allergies:   Shellfish allergy and Shrimp (diagnostic)   Social History:  The patient  reports that he quit smoking about 28 years ago. His smoking use included cigarettes. He started smoking about 36 years ago. He has a 10 pack-year smoking history. He has never used smokeless tobacco. He reports that he does not drink alcohol and does not use drugs.   Family History:   family history includes Cancer in his maternal grandmother; Diabetes in his father; Hypertension in his father and paternal grandmother; Myasthenia gravis in an other family member; Stroke in his mother.    Review of Systems: Review of Systems  Constitutional: Negative.   HENT: Negative.    Respiratory: Negative.    Cardiovascular: Negative.   Gastrointestinal: Negative.   Musculoskeletal: Negative.   Neurological: Negative.   Psychiatric/Behavioral: Negative.    All other systems reviewed and are negative.   PHYSICAL EXAM: VS:  BP 136/82 (BP Location: Right Arm, Cuff Size: Normal)   Pulse 62   Ht 5' 9 (1.753 m)   Wt 170 lb 8 oz (77.3 kg)   SpO2 98%   BMI 25.18 kg/m  , BMI Body mass index is 25.18 kg/m. GEN: Well nourished, well developed, in no acute distress HEENT: normal Neck: no JVD, carotid bruits, or masses Cardiac: RRR; no murmurs, rubs, or gallops,no edema  Respiratory:  clear to auscultation bilaterally, normal work of breathing GI: soft, nontender, nondistended, + BS MS: no deformity or atrophy Skin: warm and dry, no rash Neuro:  Strength and sensation are intact Psych: euthymic mood, full affect    Recent Labs: 10/28/2023: TSH 1.130 04/29/2024: ALT 34; BUN 13; Creatinine, Ser 1.43; Hemoglobin 14.4; Platelets 266; Potassium 3.9; Sodium 140    Lipid Panel Lab Results  Component Value Date   CHOL 210 (H) 04/29/2024   HDL 37 (L) 04/29/2024   LDLCALC 131 (H) 04/29/2024   TRIG 232 (H) 04/29/2024       Wt Readings from Last 3 Encounters:  05/14/24 170 lb 8 oz (77.3 kg)  04/29/24 174 lb 9.6 oz (79.2 kg)  03/10/24 170 lb 9.6 oz (77.4 kg)      ASSESSMENT AND PLAN:  Problem List Items Addressed This Visit       Cardiology Problems   Hyperlipidemia   Relevant Orders   EKG 12-Lead (Completed)   Hypertension   Relevant Orders   EKG 12-Lead (Completed)     Other   Interatrial cardiac shunt - Primary   Relevant Orders   EKG 12-Lead (Completed)   History of CVA with residual deficit   Relevant Orders   EKG 12-Lead (Completed)   History of stroke Echo reviewed, bubble study actually negative, confirmed by technician performing the study -Cause of stroke possibly from severe pain in the setting of multi articular gout - Unable to exclude cardiac arrhythmia, we will order Zio monitor - Currently on aspirin , statin, blood pressure relatively well-controlled on today's visit  Hyperlipidemia Tolerating Crestor  40 daily  Essential hypertension Reports blood pressure well-controlled at home, tolerating losartan  100 daily  Seen in consultation for Dr. Megan Johnson and will be referred back to her office for ongoing care of the issues detailed above  Signed, Velinda Lunger, M.D., Ph.D. [facility] [place], [place] [phone]

## 2024-05-14 ENCOUNTER — Ambulatory Visit: Attending: Cardiovascular Disease | Admitting: Cardiovascular Disease

## 2024-05-14 ENCOUNTER — Encounter: Payer: Self-pay | Admitting: Cardiovascular Disease

## 2024-05-14 ENCOUNTER — Ambulatory Visit: Attending: Cardiovascular Disease

## 2024-05-14 VITALS — BP 136/82 | HR 62 | Ht 69.0 in | Wt 170.5 lb

## 2024-05-14 DIAGNOSIS — I10 Essential (primary) hypertension: Secondary | ICD-10-CM | POA: Diagnosis not present

## 2024-05-14 DIAGNOSIS — Q248 Other specified congenital malformations of heart: Secondary | ICD-10-CM

## 2024-05-14 DIAGNOSIS — I6930 Unspecified sequelae of cerebral infarction: Secondary | ICD-10-CM | POA: Diagnosis not present

## 2024-05-14 DIAGNOSIS — E785 Hyperlipidemia, unspecified: Secondary | ICD-10-CM

## 2024-05-14 NOTE — Patient Instructions (Addendum)
 Medication Instructions:  No changes  If you need a refill on your cardiac medications before your next appointment, please call your pharmacy.   Lab work: No new labs needed  Follow-Up: At [facility], you and your health needs are our priority.  As part of our continuing mission to provide you with exceptional heart care, we have created designated Provider Care Teams.  These Care Teams include your primary Cardiologist (physician) and Advanced Practice Providers (APPs -  Physician Assistants and Nurse Practitioners) who all work together to provide you with the care you need, when you need it.  You will need a follow up appointment as needed  Providers on your designated Care Team:   Lonni Meager, NP Bernardino Bring, PA-C Cadence [place], [place]  COVID-19 Vaccine Information can be found at: [email or web address] For questions related to vaccine distribution or appointments, please email [email or web address] or call [phone].   ZIO XT- Long Term Monitor Instructions  Your physician has requested you wear a ZIO patch monitor for 14 days.  This is a single patch monitor. [employer] supplies one patch monitor per enrollment. Additional stickers are not available. Please do not apply patch if you will be having a Nuclear Stress Test, Echocardiogram, Cardiac CT, MRI, or Chest Xray during the period you would be wearing the monitor. The patch cannot be worn during these tests. You cannot remove and re-apply the ZIO XT patch monitor.  Your ZIO patch monitor will be mailed 3 day USPS to your address on file. It may take 3-5 days to receive your monitor after you have been enrolled. Once you have received your monitor, please review the enclosed instructions. Your monitor has already been registered assigning a specific monitor serial number to you.  Billing and Patient Assistance Program Information  We have supplied [employer] with any of  your insurance information on file for billing purposes.  [employer] offers a sliding scale Patient Assistance Program for patients that do not have insurance, or whose insurance does not completely cover the cost of the ZIO monitor.  You must apply for the Patient Assistance Program to qualify for this discounted rate.  To apply, please call [employer] at [phone], select option 4, select option 2, ask to apply for Patient Assistance Program. Meredeth will ask your household income, and how many people are in your household. They will quote your out-of-pocket cost based on that information. [employer] will also be able to set up a 55-month, interest-free payment plan if needed.  Applying the monitor   Shave hair from upper left chest.  Hold abrader disc by orange tab. Rub abrader in 40 strokes over the upper left chest as indicated in your monitor instructions.  Clean area with 4 enclosed alcohol pads. Let dry.  Apply patch as indicated in monitor instructions. Patch will be placed under collarbone on left side of chest with arrow pointing upward.  Rub patch adhesive wings for 2 minutes. Remove white label marked 1. Remove the white label marked 2. Rub patch adhesive wings for 2 additional minutes.  While looking in a mirror, press and release button in center of patch. A small green light will flash 3-4 times. This will be your only indicator that the monitor has been turned on.  Do not shower for the first 24 hours. You may shower after the first 24 hours.  Press the button if you feel a symptom. You will hear a small click. Record Date, Time and Symptom in the Patient Logbook.  When you are  ready to remove the patch, follow instructions on the last 2 pages of Patient Logbook.  Stick patch monitor into the tabs at the bottom of the return box.  Place Patient Logbook in the blue and white box. Use locking tab on box and tape box closed securely. The blue and white box has prepaid postage on it. Please  place it in the mailbox as soon as possible. Your physician should have your test results approximately 7-14 days after the monitor has been mailed back to [facility]  Call [facility] Customer Care at [phone] if you have questions regarding your ZIO XT patch monitor.  Call them immediately if you see an orange light blinking on your monitor.  If your monitor falls off in less than 4 days, contact our Monitor department at [phone].  If your monitor becomes loose or falls off after 4 days call [employer] at [phone] for suggestions on securing your monitor.

## 2024-05-27 ENCOUNTER — Ambulatory Visit: Admitting: Family Medicine

## 2024-05-27 ENCOUNTER — Encounter: Payer: Self-pay | Admitting: Family Medicine

## 2024-05-27 VITALS — BP 132/78 | HR 57 | Temp 98.0°F | Ht 69.0 in | Wt 172.6 lb

## 2024-05-27 DIAGNOSIS — E785 Hyperlipidemia, unspecified: Secondary | ICD-10-CM

## 2024-05-27 DIAGNOSIS — I10 Essential (primary) hypertension: Secondary | ICD-10-CM | POA: Diagnosis not present

## 2024-05-27 DIAGNOSIS — M1A09X0 Idiopathic chronic gout, multiple sites, without tophus (tophi): Secondary | ICD-10-CM

## 2024-05-27 MED ORDER — COLCHICINE 0.6 MG PO TABS: 0.6000 mg | ORAL_TABLET | Freq: Every day | ORAL | 0 refills | Status: AC

## 2024-05-27 MED ORDER — FEBUXOSTAT 80 MG PO TABS: 80.0000 mg | ORAL_TABLET | Freq: Every day | ORAL | 0 refills | Status: AC

## 2024-05-27 MED ORDER — LOSARTAN POTASSIUM 100 MG PO TABS: 100.0000 mg | ORAL_TABLET | Freq: Every day | ORAL | 0 refills | Status: AC

## 2024-05-27 NOTE — Assessment & Plan Note (Signed)
 Has not been taking his uloric . Will restart. Recheck 4-6 weeks.

## 2024-05-27 NOTE — Progress Notes (Signed)
 Patient seen in clinic today for appointment with PCP. As apart of that visit, There was application of a Zio Heart monitor. Patient received the box via mail after providers order was processed. Patient instructed on use, symptom logging using app or log book and trouble shooting techniques. Patient aware if they need assistance with removal or sending back to please contact our office for additional assistance.   Zio Heart Monitor device is successfully placed on patients chest and tolerated well. Patient was given the opportunity to ask questions and provided answers if needed.

## 2024-05-27 NOTE — Assessment & Plan Note (Signed)
 Rechecking labs. He says he's been taking his atorvastatin  daily- may need to adjust dose pending his results.

## 2024-05-27 NOTE — Assessment & Plan Note (Signed)
Better on recheck. Continue to monitor. Call with any concerns.  

## 2024-05-27 NOTE — Progress Notes (Signed)
 "  BP 132/78   Pulse (!) 57   Temp 98 F (36.7 C) (Oral)   Ht 5' 9 (1.753 m)   Wt 172 lb 9.6 oz (78.3 kg)   SpO2 98%   BMI 25.49 kg/m    Subjective:    Patient ID: Sean FORBES Vita Mickey., male    DOB: 03-04-1966, 59 y.o.   MRN: [id number]  HPI: Sean Pinn. is a 59 y.o. male  Chief Complaint  Patient presents with   Hypertension   No gout flares. Has not been taking his uloric . Unclear how long he's been off of it.   HYPERTENSION / HYPERLIPIDEMIA Satisfied with current treatment? yes Duration of hypertension: chronic BP monitoring frequency: a few times a month BP range: 132/80 BP medication side effects: no Past BP meds: lisinopril  Duration of hyperlipidemia: chronic Cholesterol medication side effects: no Cholesterol supplements: none Past cholesterol medications: atorvastatin  Medication compliance: good compliance Aspirin : yes Recent stressors: no Recurrent headaches: no Visual changes: no Palpitations: no Dyspnea: no Chest pain: no Lower extremity edema: no Dizzy/lightheaded: no  Relevant past medical, surgical, family and social history reviewed and updated as indicated. Interim medical history since our last visit reviewed. Allergies and medications reviewed and updated.  Review of Systems  Constitutional: Negative.   Respiratory: Negative.    Cardiovascular: Negative.   Musculoskeletal: Negative.   Neurological: Negative.   Psychiatric/Behavioral: Negative.      Per HPI unless specifically indicated above     Objective:    BP 132/78   Pulse (!) 57   Temp 98 F (36.7 C) (Oral)   Ht 5' 9 (1.753 m)   Wt 172 lb 9.6 oz (78.3 kg)   SpO2 98%   BMI 25.49 kg/m   Wt Readings from Last 3 Encounters:  05/27/24 172 lb 9.6 oz (78.3 kg)  05/14/24 170 lb 8 oz (77.3 kg)  04/29/24 174 lb 9.6 oz (79.2 kg)    Physical Exam Vitals and nursing note reviewed.  Constitutional:      General: He is not in acute distress.    Appearance: Normal  appearance. He is not ill-appearing, toxic-appearing or diaphoretic.  HENT:     Head: Normocephalic and atraumatic.     Right Ear: External ear normal.     Left Ear: External ear normal.     Nose: Nose normal.     Mouth/Throat:     Mouth: Mucous membranes are moist.     Pharynx: Oropharynx is clear.  Eyes:     General: No scleral icterus.       Right eye: No discharge.        Left eye: No discharge.     Extraocular Movements: Extraocular movements intact.     Conjunctiva/sclera: Conjunctivae normal.     Pupils: Pupils are equal, round, and reactive to light.  Cardiovascular:     Rate and Rhythm: Normal rate and regular rhythm.     Pulses: Normal pulses.     Heart sounds: Normal heart sounds. No murmur heard.    No friction rub. No gallop.  Pulmonary:     Effort: Pulmonary effort is normal. No respiratory distress.     Breath sounds: Normal breath sounds. No stridor. No wheezing, rhonchi or rales.  Chest:     Chest wall: No tenderness.  Musculoskeletal:        General: Normal range of motion.     Cervical back: Normal range of motion and neck supple.  Skin:  General: Skin is warm and dry.     Capillary Refill: Capillary refill takes less than 2 seconds.     Coloration: Skin is not jaundiced or pale.     Findings: No bruising, erythema, lesion or rash.  Neurological:     General: No focal deficit present.     Mental Status: He is alert and oriented to person, place, and time. Mental status is at baseline.  Psychiatric:        Mood and Affect: Mood normal.        Behavior: Behavior normal.        Thought Content: Thought content normal.        Judgment: Judgment normal.     Results for orders placed or performed in visit on 04/29/24  Bayer DCA Hb A1c Waived   Collection Time: 04/29/24  3:23 PM  Result Value Ref Range   HB A1C (BAYER DCA - WAIVED) 5.9 (H) 4.8 - 5.6 %  CBC with Differential/Platelet   Collection Time: 04/29/24  3:24 PM  Result Value Ref Range   WBC  5.8 3.4 - 10.8 x10E3/uL   RBC 4.80 4.14 - 5.80 x10E6/uL   Hemoglobin 14.4 13.0 - 17.7 g/dL   Hematocrit 57.6 62.4 - 51.0 %   MCV 88 79 - 97 fL   MCH 30.0 26.6 - 33.0 pg   MCHC 34.0 31.5 - 35.7 g/dL   RDW 87.1 88.3 - 84.5 %   Platelets 266 150 - 450 x10E3/uL   Neutrophils 55 Not Estab. %   Lymphs 31 Not Estab. %   Monocytes 10 Not Estab. %   Eos 2 Not Estab. %   Basos 2 Not Estab. %   Neutrophils Absolute 3.2 1.4 - 7.0 x10E3/uL   Lymphocytes Absolute 1.8 0.7 - 3.1 x10E3/uL   Monocytes Absolute 0.6 0.1 - 0.9 x10E3/uL   EOS (ABSOLUTE) 0.1 0.0 - 0.4 x10E3/uL   Basophils Absolute 0.1 0.0 - 0.2 x10E3/uL   Immature Granulocytes 0 Not Estab. %   Immature Grans (Abs) 0.0 0.0 - 0.1 x10E3/uL  Lipid Panel w/o Chol/HDL Ratio   Collection Time: 04/29/24  3:24 PM  Result Value Ref Range   Cholesterol, Total 210 (H) 100 - 199 mg/dL   Triglycerides 767 (H) 0 - 149 mg/dL   HDL 37 (L) >60 mg/dL   VLDL Cholesterol Cal 42 (H) 5 - 40 mg/dL   LDL Chol Calc (NIH) 868 (H) 0 - 99 mg/dL  Uric acid   Collection Time: 04/29/24  3:24 PM  Result Value Ref Range   Uric Acid 6.9 3.8 - 8.4 mg/dL  Comprehensive metabolic panel with GFR   Collection Time: 04/29/24  3:24 PM  Result Value Ref Range   Glucose 86 70 - 99 mg/dL   BUN 13 6 - 24 mg/dL   Creatinine, Ser 8.56 (H) 0.76 - 1.27 mg/dL   eGFR 57 (L) >40 fO/fpw/8.26   BUN/Creatinine Ratio 9 9 - 20   Sodium 140 134 - 144 mmol/L   Potassium 3.9 3.5 - 5.2 mmol/L   Chloride 104 96 - 106 mmol/L   CO2 23 20 - 29 mmol/L   Calcium  9.5 8.7 - 10.2 mg/dL   Total Protein 6.9 6.0 - 8.5 g/dL   Albumin 4.1 3.8 - 4.9 g/dL   Globulin, Total 2.8 1.5 - 4.5 g/dL   Bilirubin Total 0.3 0.0 - 1.2 mg/dL   Alkaline Phosphatase 92 47 - 123 IU/L   AST 28 0 - 40 IU/L  ALT 34 0 - 44 IU/L      Assessment & Plan:   Problem List Items Addressed This Visit       Cardiovascular and Mediastinum   Hypertension   Better on recheck. Continue to monitor. Call with any  concerns.       Relevant Medications   losartan  (COZAAR ) 100 MG tablet   Other Relevant Orders   Comprehensive metabolic panel with GFR     Other   Hyperlipidemia   Rechecking labs. He says he's been taking his atorvastatin  daily- may need to adjust dose pending his results.       Relevant Medications   losartan  (COZAAR ) 100 MG tablet   Other Relevant Orders   Comprehensive metabolic panel with GFR   Lipid Panel w/o Chol/HDL Ratio   Gout - Primary   Has not been taking his uloric . Will restart. Recheck 4-6 weeks.       Relevant Orders   Comprehensive metabolic panel with GFR   Uric acid     Follow up plan: Return in about 6 weeks (around 07/08/2024).      "

## 2024-05-28 LAB — COMPREHENSIVE METABOLIC PANEL WITH GFR
ALT: 37 IU/L (ref 0–44)
AST: 33 IU/L (ref 0–40)
Albumin: 4.3 g/dL (ref 3.8–4.9)
Alkaline Phosphatase: 104 IU/L (ref 47–123)
BUN/Creatinine Ratio: 10 (ref 9–20)
BUN: 14 mg/dL (ref 6–24)
Bilirubin Total: 0.5 mg/dL (ref 0.0–1.2)
CO2: 21 mmol/L (ref 20–29)
Calcium: 9.7 mg/dL (ref 8.7–10.2)
Chloride: 104 mmol/L (ref 96–106)
Creatinine, Ser: 1.39 mg/dL — ABNORMAL HIGH (ref 0.76–1.27)
Globulin, Total: 2.7 g/dL (ref 1.5–4.5)
Glucose: 99 mg/dL (ref 70–99)
Potassium: 4.3 mmol/L (ref 3.5–5.2)
Sodium: 142 mmol/L (ref 134–144)
Total Protein: 7 g/dL (ref 6.0–8.5)
eGFR: 59 mL/min/1.73 — ABNORMAL LOW

## 2024-05-28 LAB — LIPID PANEL W/O CHOL/HDL RATIO
Cholesterol, Total: 143 mg/dL (ref 100–199)
HDL: 41 mg/dL
LDL Chol Calc (NIH): 83 mg/dL (ref 0–99)
Triglycerides: 105 mg/dL (ref 0–149)
VLDL Cholesterol Cal: 19 mg/dL (ref 5–40)

## 2024-05-28 LAB — URIC ACID: Uric Acid: 6.5 mg/dL (ref 3.8–8.4)

## 2024-05-30 ENCOUNTER — Ambulatory Visit: Payer: Self-pay | Admitting: Family Medicine

## 2024-06-04 ENCOUNTER — Other Ambulatory Visit: Payer: Self-pay | Admitting: Family Medicine

## 2024-06-04 DIAGNOSIS — I10 Essential (primary) hypertension: Secondary | ICD-10-CM

## 2024-06-04 NOTE — Telephone Encounter (Signed)
 No longer current dosing of this medication  Requested Prescriptions  Pending Prescriptions Disp Refills   losartan  (COZAAR ) 50 MG tablet [Pharmacy Med Name: LOSARTAN  POTASSIUM 50 MG TAB] 90 tablet 0    Sig: TAKE 1 TABLET BY MOUTH EVERY DAY     Cardiovascular:  Angiotensin Receptor Blockers Failed - 06/04/2024 11:36 AM      Failed - Cr in normal range and within 180 days    Creatinine, Ser  Date Value Ref Range Status  05/27/2024 1.39 (H) 0.76 - 1.27 mg/dL Final         Passed - K in normal range and within 180 days    Potassium  Date Value Ref Range Status  05/27/2024 4.3 3.5 - 5.2 mmol/L Final         Passed - Patient is not pregnant      Passed - Last BP in normal range    BP Readings from Last 1 Encounters:  05/27/24 132/78         Passed - Valid encounter within last 6 months    Recent Outpatient Visits           1 week ago Idiopathic chronic gout of multiple sites without tophus   [place] [facility] [place], Megan P, DO   1 month ago Hyperlipidemia, unspecified hyperlipidemia type   [place] [facility] [place], Megan P, DO   2 months ago Laryngitis   [facility] [facility] Herold Hadassah SQUIBB, MD   3 months ago Hyperlipidemia, unspecified hyperlipidemia type   [place] [facility], Megan P, DO   3 months ago History of CVA with residual deficit   [place] [facility] [place], Megan P, DO

## 2024-07-13 ENCOUNTER — Ambulatory Visit: Admitting: Family Medicine
